# Patient Record
Sex: Male | Born: 1970
Health system: Southern US, Community
[De-identification: ages and names within clinical notes are randomized; demographics above are authoritative.]

## PROBLEM LIST (undated history)

## (undated) DIAGNOSIS — K219 Gastro-esophageal reflux disease without esophagitis: Secondary | ICD-10-CM

## (undated) DIAGNOSIS — I2510 Atherosclerotic heart disease of native coronary artery without angina pectoris: Secondary | ICD-10-CM

## (undated) DIAGNOSIS — I480 Paroxysmal atrial fibrillation: Secondary | ICD-10-CM

## (undated) HISTORY — DX: Gastro-esophageal reflux disease without esophagitis: K21.9

## (undated) HISTORY — PX: NISSEN FUNDOPLICATION: SHX2091

---

## 2003-10-06 ENCOUNTER — Emergency Department (HOSPITAL_COMMUNITY): Admission: EM | Admit: 2003-10-06 | Discharge: 2003-10-07 | Payer: Self-pay | Admitting: Emergency Medicine

## 2003-11-26 ENCOUNTER — Emergency Department (HOSPITAL_COMMUNITY): Admission: EM | Admit: 2003-11-26 | Discharge: 2003-11-26 | Payer: Self-pay | Admitting: Emergency Medicine

## 2015-11-27 ENCOUNTER — Encounter (HOSPITAL_COMMUNITY): Payer: Self-pay

## 2015-11-27 ENCOUNTER — Emergency Department (HOSPITAL_COMMUNITY)
Admission: EM | Admit: 2015-11-27 | Discharge: 2015-11-27 | Disposition: A | Discharge status: Home / Self Care | Payer: No Typology Code available for payment source | Attending: Emergency Medicine | Admitting: Emergency Medicine

## 2015-11-27 ENCOUNTER — Emergency Department (HOSPITAL_COMMUNITY): Payer: No Typology Code available for payment source

## 2015-11-27 DIAGNOSIS — M542 Cervicalgia: Secondary | ICD-10-CM

## 2015-11-27 DIAGNOSIS — S199XXA Unspecified injury of neck, initial encounter: Secondary | ICD-10-CM | POA: Diagnosis present

## 2015-11-27 DIAGNOSIS — Y92410 Unspecified street and highway as the place of occurrence of the external cause: Secondary | ICD-10-CM | POA: Diagnosis not present

## 2015-11-27 DIAGNOSIS — Y939 Activity, unspecified: Secondary | ICD-10-CM | POA: Insufficient documentation

## 2015-11-27 DIAGNOSIS — Y999 Unspecified external cause status: Secondary | ICD-10-CM | POA: Insufficient documentation

## 2015-11-27 MED ORDER — IBUPROFEN 400 MG PO TABS
800.0000 mg | ORAL_TABLET | Freq: Once | ORAL | Status: AC
  Administered 2015-11-27: 800 mg via ORAL
  Filled 2015-11-27: qty 2

## 2015-11-27 MED ORDER — IBUPROFEN 800 MG PO TABS: 800.0000 mg | ORAL_TABLET | Freq: Three times a day (TID) | ORAL | 0 refills | Status: DC

## 2015-11-27 MED ORDER — CYCLOBENZAPRINE HCL 10 MG PO TABS: 10.0000 mg | ORAL_TABLET | Freq: Two times a day (BID) | ORAL | 0 refills | Status: DC | PRN

## 2015-11-27 MED ORDER — ACETAMINOPHEN 325 MG PO TABS
650.0000 mg | ORAL_TABLET | Freq: Once | ORAL | Status: AC
  Administered 2015-11-27: 650 mg via ORAL
  Filled 2015-11-27: qty 2
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
## 2015-11-27 NOTE — ED Notes (Signed)
Patient returned from x ray and ambulated to restroom without difficulty
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
## 2015-11-27 NOTE — Discharge Instructions (Signed)
Take Flexeril twice daily as needed for muscle stiffness. Do not drive only take this medication. You may also take ibuprofen 3 times daily for pain. Please follow-up with your regular doctor in 1 week. Return to the emergency department if you experience uncontrolled pain, numbness, weakness, chest pain, shortness breath or abdominal pain, or any new symptoms.

## 2016-03-28 DIAGNOSIS — K219 Gastro-esophageal reflux disease without esophagitis: Secondary | ICD-10-CM | POA: Diagnosis not present

## 2016-05-02 DIAGNOSIS — K210 Gastro-esophageal reflux disease with esophagitis: Secondary | ICD-10-CM | POA: Diagnosis not present

## 2016-11-25 DIAGNOSIS — S61211A Laceration without foreign body of left index finger without damage to nail, initial encounter: Secondary | ICD-10-CM | POA: Diagnosis not present

## 2016-11-25 DIAGNOSIS — W260XXA Contact with knife, initial encounter: Secondary | ICD-10-CM | POA: Diagnosis not present

## 2016-11-25 DIAGNOSIS — Y93G3 Activity, cooking and baking: Secondary | ICD-10-CM | POA: Diagnosis not present

## 2016-11-30 DIAGNOSIS — Z5189 Encounter for other specified aftercare: Secondary | ICD-10-CM | POA: Diagnosis not present

## 2016-12-30 DIAGNOSIS — H524 Presbyopia: Secondary | ICD-10-CM | POA: Diagnosis not present

## 2017-03-04 DIAGNOSIS — L03032 Cellulitis of left toe: Secondary | ICD-10-CM | POA: Diagnosis not present

## 2017-09-17 DIAGNOSIS — T782XXA Anaphylactic shock, unspecified, initial encounter: Secondary | ICD-10-CM | POA: Diagnosis not present

## 2017-09-17 DIAGNOSIS — R21 Rash and other nonspecific skin eruption: Secondary | ICD-10-CM | POA: Diagnosis not present

## 2017-09-17 DIAGNOSIS — R069 Unspecified abnormalities of breathing: Secondary | ICD-10-CM | POA: Diagnosis not present

## 2017-09-17 DIAGNOSIS — S0006XA Insect bite (nonvenomous) of scalp, initial encounter: Secondary | ICD-10-CM | POA: Diagnosis not present

## 2017-09-17 DIAGNOSIS — R0602 Shortness of breath: Secondary | ICD-10-CM | POA: Diagnosis not present

## 2017-09-17 DIAGNOSIS — T63441A Toxic effect of venom of bees, accidental (unintentional), initial encounter: Secondary | ICD-10-CM | POA: Diagnosis not present

## 2017-09-17 DIAGNOSIS — R479 Unspecified speech disturbances: Secondary | ICD-10-CM | POA: Diagnosis not present

## 2017-09-17 DIAGNOSIS — G4489 Other headache syndrome: Secondary | ICD-10-CM | POA: Diagnosis not present

## 2017-09-17 DIAGNOSIS — T7840XA Allergy, unspecified, initial encounter: Secondary | ICD-10-CM | POA: Diagnosis not present

## 2017-09-17 DIAGNOSIS — L299 Pruritus, unspecified: Secondary | ICD-10-CM | POA: Diagnosis not present

## 2017-09-17 DIAGNOSIS — R220 Localized swelling, mass and lump, head: Secondary | ICD-10-CM | POA: Diagnosis not present

## 2018-01-07 ENCOUNTER — Ambulatory Visit: Payer: BLUE CROSS/BLUE SHIELD | Admitting: Family Medicine

## 2018-01-07 ENCOUNTER — Encounter: Payer: Self-pay | Admitting: Family Medicine

## 2018-01-07 VITALS — BP 92/60 | HR 53 | Temp 98.3°F | Wt 182.4 lb

## 2018-01-07 DIAGNOSIS — R6882 Decreased libido: Secondary | ICD-10-CM | POA: Diagnosis not present

## 2018-01-07 DIAGNOSIS — K219 Gastro-esophageal reflux disease without esophagitis: Secondary | ICD-10-CM | POA: Diagnosis not present

## 2018-01-07 DIAGNOSIS — J069 Acute upper respiratory infection, unspecified: Secondary | ICD-10-CM | POA: Diagnosis not present

## 2018-01-07 MED ORDER — PANTOPRAZOLE SODIUM 40 MG PO TBEC: 40.0000 mg | DELAYED_RELEASE_TABLET | Freq: Every day | ORAL | 2 refills | Status: DC

## 2018-01-07 NOTE — Assessment & Plan Note (Signed)
Reflux is not well controlled with omeprazole.  Rx changed to pantoprazole.  He will let me know how this is working for him over the next couple of weeks.

## 2018-01-07 NOTE — Patient Instructions (Signed)
Return for lab visit when feeling better.  Best time to check is between 8-930am, fasting.   Upper Respiratory Infection, Adult Most upper respiratory infections (URIs) are caused by a virus. A URI affects the nose, throat, and upper air passages. The most common type of URI is often called "the common cold." Follow these instructions at home:  Take medicines only as told by your doctor.  Gargle warm saltwater or take cough drops to comfort your throat as told by your doctor.  Use a warm mist humidifier or inhale steam from a shower to increase air moisture. This may make it easier to breathe.  Drink enough fluid to keep your pee (urine) clear or pale yellow.  Eat soups and other clear broths.  Have a healthy diet.  Rest as needed.  Go back to work when your fever is gone or your doctor says it is okay. ? You may need to stay home longer to avoid giving your URI to others. ? You can also wear a face mask and wash your hands often to prevent spread of the virus.  Use your inhaler more if you have asthma.  Do not use any tobacco products, including cigarettes, chewing tobacco, or electronic cigarettes. If you need help quitting, ask your doctor. Contact a doctor if:  You are getting worse, not better.  Your symptoms are not helped by medicine.  You have chills.  You are getting more short of breath.  You have brown or red mucus.  You have yellow or brown discharge from your nose.  You have pain in your face, especially when you bend forward.  You have a fever.  You have puffy (swollen) neck glands.  You have pain while swallowing.  You have white areas in the back of your throat. Get help right away if:  You have very bad or constant: ? Headache. ? Ear pain. ? Pain in your forehead, behind your eyes, and over your cheekbones (sinus pain). ? Chest pain.  You have long-lasting (chronic) lung disease and any of the following: ? Wheezing. ? Long-lasting  cough. ? Coughing up blood. ? A change in your usual mucus.  You have a stiff neck.  You have changes in your: ? Vision. ? Hearing. ? Thinking. ? Mood. This information is not intended to replace advice given to you by your health care provider. Make sure you discuss any questions you have with your health care provider. Document Released: 08/06/2007 Document Revised: 10/21/2015 Document Reviewed: 05/25/2013 Elsevier Interactive Patient Education  2018 [employer].

## 2018-01-07 NOTE — Progress Notes (Signed)
Travis Kaufman - 47 y.o. male MRN [id number]  Date of birth: 10-10-1970  Subjective Chief Complaint  Patient presents with  . Sinus Problem    HPI SUBJECTIVE:  Travis Kaufman is a 47 y.o. male with history of GERD who complains of congestion, sore throat, dry cough and fever for 2 days.  Only had fever and sweats on first day but none since that time.  He denies a history of chest pain, dizziness, myalgias, nausea, shortness of breath, vomiting and wheezing and does not have a history of asthma. Patient does not smoke cigarettes.  In regards to his GERD he is taking omeprazole but reports that symptoms are not adequately controlled with current medication  Also has concerns about decreased energy levels and decreased libido.  Denies issues with ED.  Would like to have testosterone levels checked.   ROS:  A comprehensive ROS was completed and negative except as noted per HPI    Allergies  Allergen Reactions  . Wasp Venom Anaphylaxis    Hives, Swelling, Shortness of Breath    Past Medical History:  Diagnosis Date  . GERD (gastroesophageal reflux disease)     History reviewed. No pertinent surgical history.  Social History   Socioeconomic History  . Marital status: Married    Spouse name: Not on file  . Number of children: Not on file  . Years of education: Not on file  . Highest education level: Not on file  Occupational History  . Not on file  Social Needs  . Financial resource strain: Not on file  . Food insecurity:    Worry: Not on file    Inability: Not on file  . Transportation needs:    Medical: Not on file    Non-medical: Not on file  Tobacco Use  . Smoking status: Never Smoker  . Smokeless tobacco: Former Network engineer and Sexual Activity  . Alcohol use: Yes    Comment: occasional   . Drug use: Never  . Sexual activity: Not on file  Lifestyle  . Physical activity:    Days per week: Not on file    Minutes per session: Not on file  . Stress:  Not on file  Relationships  . Social connections:    Talks on phone: Not on file    Gets together: Not on file    Attends religious service: Not on file    Active member of club or organization: Not on file    Attends meetings of clubs or organizations: Not on file    Relationship status: Not on file  Other Topics Concern  . Not on file  Social History Narrative  . Not on file    History reviewed. No pertinent family history.  Health Maintenance  Topic Date Due  . TETANUS/TDAP  05/19/1989  . INFLUENZA VACCINE  10/01/2017  . HIV Screening  01/08/2019 (Originally 05/19/1985)    ----------------------------------------------------------------------------------------------------------------------------------------------------------------------------------------------------------------- Physical Exam BP 92/60   Pulse (!) 53   Temp 98.3 F (36.8 C) (Oral)   Wt 182 lb 6.4 oz (82.7 kg)   SpO2 98%   BMI 25.44 kg/m   Physical Exam  Constitutional: He is oriented to person, place, and time. He appears well-nourished. No distress.  HENT:  Head: Normocephalic and atraumatic.  Mouth/Throat: Oropharynx is clear and moist.  TM normal No sinus tenderness   Eyes: No scleral icterus.  Neck: Neck supple. No thyromegaly present.  Cardiovascular: Normal rate, regular rhythm and normal heart sounds.  Pulmonary/Chest:  Effort normal and breath sounds normal.  Musculoskeletal: He exhibits no edema.  Lymphadenopathy:    He has no cervical adenopathy.  Neurological: He is alert and oriented to person, place, and time.  Skin: Skin is warm and dry.  Psychiatric: He has a normal mood and affect. His behavior is normal.    ------------------------------------------------------------------------------------------------------------------------------------------------------------------------------------------------------------------- Assessment and Plan  Decreased libido without sexual  dysfunction He'll return for early am labs when fasting to check testosterone levels.   Viral upper respiratory tract infection  Symptomatic therapy suggested: push fluids, rest, use vaporizer or mist prn and return office visit prn if symptoms persist or worsen. Lack of antibiotic effectiveness discussed with him. Call or return to clinic prn if these symptoms worsen or fail to improve as anticipated.   GERD (gastroesophageal reflux disease) Reflux is not well controlled with omeprazole.  Rx changed to pantoprazole.  He will let me know how this is working for him over the next couple of weeks.

## 2018-01-07 NOTE — Assessment & Plan Note (Signed)
  Symptomatic therapy suggested: push fluids, rest, use vaporizer or mist prn and return office visit prn if symptoms persist or worsen. Lack of antibiotic effectiveness discussed with him. Call or return to clinic prn if these symptoms worsen or fail to improve as anticipated.

## 2018-01-07 NOTE — Assessment & Plan Note (Signed)
He'll return for early am labs when fasting to check testosterone levels.

## 2018-01-14 ENCOUNTER — Ambulatory Visit: Payer: BLUE CROSS/BLUE SHIELD | Admitting: Family Medicine

## 2018-02-10 ENCOUNTER — Other Ambulatory Visit (INDEPENDENT_AMBULATORY_CARE_PROVIDER_SITE_OTHER): Payer: BLUE CROSS/BLUE SHIELD

## 2018-02-10 DIAGNOSIS — R6882 Decreased libido: Secondary | ICD-10-CM | POA: Diagnosis not present

## 2018-02-10 LAB — TESTOSTERONE: Testosterone: 590.44 ng/dL (ref 300.00–890.00)

## 2018-02-13 LAB — TESTOSTERONE, FREE: TESTOSTERONE FREE: 81.1 pg/mL (ref 46.0–224.0)

## 2018-02-13 LAB — SEX HORMONE BINDING GLOBULIN: SEX HORMONE BINDING: 51 nmol/L — AB (ref 10–50)

## 2018-02-14 LAB — TESTOSTERONE, % FREE: Testosterone-% Free: 1.3 %

## 2018-06-24 DIAGNOSIS — M545 Low back pain: Secondary | ICD-10-CM | POA: Diagnosis not present

## 2018-06-24 DIAGNOSIS — M62830 Muscle spasm of back: Secondary | ICD-10-CM | POA: Diagnosis not present

## 2018-06-24 DIAGNOSIS — M5417 Radiculopathy, lumbosacral region: Secondary | ICD-10-CM | POA: Diagnosis not present

## 2018-06-24 DIAGNOSIS — M9903 Segmental and somatic dysfunction of lumbar region: Secondary | ICD-10-CM | POA: Diagnosis not present

## 2018-06-25 DIAGNOSIS — M9903 Segmental and somatic dysfunction of lumbar region: Secondary | ICD-10-CM | POA: Diagnosis not present

## 2018-06-25 DIAGNOSIS — M5417 Radiculopathy, lumbosacral region: Secondary | ICD-10-CM | POA: Diagnosis not present

## 2018-06-25 DIAGNOSIS — M545 Low back pain: Secondary | ICD-10-CM | POA: Diagnosis not present

## 2018-06-25 DIAGNOSIS — M62830 Muscle spasm of back: Secondary | ICD-10-CM | POA: Diagnosis not present

## 2018-06-30 DIAGNOSIS — M5417 Radiculopathy, lumbosacral region: Secondary | ICD-10-CM | POA: Diagnosis not present

## 2018-06-30 DIAGNOSIS — M545 Low back pain: Secondary | ICD-10-CM | POA: Diagnosis not present

## 2018-06-30 DIAGNOSIS — M9903 Segmental and somatic dysfunction of lumbar region: Secondary | ICD-10-CM | POA: Diagnosis not present

## 2018-06-30 DIAGNOSIS — M62830 Muscle spasm of back: Secondary | ICD-10-CM | POA: Diagnosis not present

## 2018-07-02 DIAGNOSIS — M545 Low back pain: Secondary | ICD-10-CM | POA: Diagnosis not present

## 2018-07-02 DIAGNOSIS — M9903 Segmental and somatic dysfunction of lumbar region: Secondary | ICD-10-CM | POA: Diagnosis not present

## 2018-07-02 DIAGNOSIS — M62830 Muscle spasm of back: Secondary | ICD-10-CM | POA: Diagnosis not present

## 2018-07-02 DIAGNOSIS — M5417 Radiculopathy, lumbosacral region: Secondary | ICD-10-CM | POA: Diagnosis not present

## 2018-07-12 ENCOUNTER — Telehealth: Payer: Self-pay | Admitting: Family Medicine

## 2018-07-12 NOTE — Telephone Encounter (Signed)
Called pt because of message he sent in to request appt for cholesterol/small mole on neck, left message

## 2018-07-13 ENCOUNTER — Telehealth (INDEPENDENT_AMBULATORY_CARE_PROVIDER_SITE_OTHER): Payer: BLUE CROSS/BLUE SHIELD | Admitting: Family Medicine

## 2018-07-13 ENCOUNTER — Encounter: Payer: Self-pay | Admitting: Family Medicine

## 2018-07-13 DIAGNOSIS — K219 Gastro-esophageal reflux disease without esophagitis: Secondary | ICD-10-CM

## 2018-07-13 DIAGNOSIS — D229 Melanocytic nevi, unspecified: Secondary | ICD-10-CM | POA: Insufficient documentation

## 2018-07-13 DIAGNOSIS — E785 Hyperlipidemia, unspecified: Secondary | ICD-10-CM | POA: Insufficient documentation

## 2018-07-13 MED ORDER — DEXLANSOPRAZOLE 60 MG PO CPDR
60.0000 mg | DELAYED_RELEASE_CAPSULE | Freq: Every day | ORAL | 6 refills | Status: DC
Start: 2018-07-13 — End: 2019-04-25

## 2018-07-13 NOTE — Assessment & Plan Note (Signed)
-  Elevated LDL on recent labs, he will send me a full copy to review.

## 2018-07-13 NOTE — Assessment & Plan Note (Signed)
-  Has not been well controlled with omeprazole or pantoprazole.  Will give trial of dexilant.  If not improving with this, we discussed referral back to GI.

## 2018-07-13 NOTE — Progress Notes (Signed)
Travis Kaufman - 48 y.o. male MRN [id number]  Date of birth: 01/08/1971   This visit type was conducted due to national recommendations for restrictions regarding the COVID-19 Pandemic (e.g. social distancing).  This format is felt to be most appropriate for this patient at this time.  All issues noted in this document were discussed and addressed.  No physical exam was performed (except for noted visual exam findings with Video Visits).  I discussed the limitations of evaluation and management by telemedicine and the availability of in person appointments. The patient expressed understanding and agreed to proceed.  I connected with@ on 07/13/18 at  3:00 PM EDT by a video enabled telemedicine application and verified that I am speaking with the correct person using two identifiers.   Patient Location: Home [address] [place] [address]   Provider location:   Home office  Chief Complaint  Patient presents with  . Follow-up    Lipids/small mole on neck-L,no color change, "just dark"," increased in sized over last 6 mo"    HPI  Travis Kaufman is a 48 y.o. male who presents via audio/video conferencing for a telehealth visit today.  He would like to discuss ongoing issues with reflux, recent cholesterol results and mole.   -GERD:  He continues to have poorly controlled GERD.  He has tried several medications in the past including omeprazole and most recently protonix.  Reports that several family members had issues with this in the past including his father and sister.  He has been seen by GI with normal EGD previously.   -HLD:  Had recent lipid panel completed through work with LDL in the 170's. He reports that he eats a healthy diet and is quite active.  No cholesterol issues run in the family that he is aware of.   -Mole:  Pigmented mole on L side of neck, seems to be getting larger.  Non-painful.     ROS:  A comprehensive ROS was completed and negative  except as noted per HPI  Past Medical History:  Diagnosis Date  . GERD (gastroesophageal reflux disease)     No past surgical history on file.  No family history on file.  Social History   Socioeconomic History  . Marital status: Married    Spouse name: Not on file  . Number of children: Not on file  . Years of education: Not on file  . Highest education level: Not on file  Occupational History  . Not on file  Social Needs  . Financial resource strain: Not on file  . Food insecurity:    Worry: Not on file    Inability: Not on file  . Transportation needs:    Medical: Not on file    Non-medical: Not on file  Tobacco Use  . Smoking status: Never Smoker  . Smokeless tobacco: Former Network engineer and Sexual Activity  . Alcohol use: Yes    Comment: occasional   . Drug use: Never  . Sexual activity: Not on file  Lifestyle  . Physical activity:    Days per week: Not on file    Minutes per session: Not on file  . Stress: Not on file  Relationships  . Social connections:    Talks on phone: Not on file    Gets together: Not on file    Attends religious service: Not on file    Active member of club or organization: Not on file  Attends meetings of clubs or organizations: Not on file    Relationship status: Not on file  . Intimate partner violence:    Fear of current or ex partner: Not on file    Emotionally abused: Not on file    Physically abused: Not on file    Forced sexual activity: Not on file  Other Topics Concern  . Not on file  Social History Narrative  . Not on file     Current Outpatient Medications:  .  EPINEPHrine 0.3 mg/0.3 mL IJ SOAJ injection, Inject into the muscle., Disp: , Rfl:  .  pantoprazole (PROTONIX) 40 MG tablet, Take 1 tablet (40 mg total) by mouth daily., Disp: 90 tablet, Rfl: 2  EXAM:  VITALS per patient if applicable: BP 098/11   Pulse (!) 54 Comment: smartwatch  Ht 5\' 11"  (1.803 m)   Wt 182 lb 9.6 oz (82.8 kg)   BMI 25.47  kg/m   GENERAL: alert, oriented, appears well and in no acute distress  HEENT: atraumatic, conjunttiva clear, no obvious abnormalities on inspection of external nose and ears  NECK: normal movements of the head and neck  LUNGS: on inspection no signs of respiratory distress, breathing rate appears normal, no obvious gross SOB, gasping or wheezing  CV: no obvious cyanosis  Skin: Pigmented lesion to L side of neck.  Difficult to see over video.    MS: moves all visible extremities without noticeable abnormality  PSYCH/NEURO: pleasant and cooperative, no obvious depression or anxiety, speech and thought processing grossly intact  ASSESSMENT AND PLAN:  Discussed the following assessment and plan:  GERD (gastroesophageal reflux disease) -Has not been well controlled with omeprazole or pantoprazole.  Will give trial of dexilant.  If not improving with this, we discussed referral back to GI.    HLD (hyperlipidemia) -Elevated LDL on recent labs, he will send me a full copy to review.     [facility] Will have him come in to evaluate in office.        I discussed the assessment and treatment plan with the patient. The patient was provided an opportunity to ask questions and all were answered. The patient agreed with the plan and demonstrated an understanding of the instructions.   The patient was advised to call back or seek an in-person evaluation if the symptoms worsen or if the condition fails to improve as anticipated.     Luetta Nutting, DO

## 2018-07-13 NOTE — Assessment & Plan Note (Signed)
Will have him come in to evaluate in office.

## 2018-07-14 ENCOUNTER — Encounter: Payer: Self-pay | Admitting: Family Medicine

## 2018-08-15 ENCOUNTER — Observation Stay (HOSPITAL_BASED_OUTPATIENT_CLINIC_OR_DEPARTMENT_OTHER): Payer: BC Managed Care – PPO

## 2018-08-15 ENCOUNTER — Encounter (HOSPITAL_BASED_OUTPATIENT_CLINIC_OR_DEPARTMENT_OTHER): Payer: Self-pay | Admitting: Emergency Medicine

## 2018-08-15 ENCOUNTER — Emergency Department (HOSPITAL_BASED_OUTPATIENT_CLINIC_OR_DEPARTMENT_OTHER): Payer: BC Managed Care – PPO

## 2018-08-15 ENCOUNTER — Observation Stay (HOSPITAL_BASED_OUTPATIENT_CLINIC_OR_DEPARTMENT_OTHER)
Admission: EM | Admit: 2018-08-15 | Discharge: 2018-08-17 | Disposition: A | Discharge status: Home / Self Care | Payer: BC Managed Care – PPO | Attending: Cardiology | Admitting: Cardiology

## 2018-08-15 ENCOUNTER — Other Ambulatory Visit: Payer: Self-pay

## 2018-08-15 DIAGNOSIS — E785 Hyperlipidemia, unspecified: Secondary | ICD-10-CM | POA: Diagnosis present

## 2018-08-15 DIAGNOSIS — N179 Acute kidney failure, unspecified: Secondary | ICD-10-CM | POA: Diagnosis not present

## 2018-08-15 DIAGNOSIS — Z20828 Contact with and (suspected) exposure to other viral communicable diseases: Secondary | ICD-10-CM | POA: Diagnosis not present

## 2018-08-15 DIAGNOSIS — R12 Heartburn: Secondary | ICD-10-CM | POA: Diagnosis not present

## 2018-08-15 DIAGNOSIS — M79602 Pain in left arm: Secondary | ICD-10-CM | POA: Diagnosis not present

## 2018-08-15 DIAGNOSIS — R0789 Other chest pain: Principal | ICD-10-CM | POA: Insufficient documentation

## 2018-08-15 DIAGNOSIS — R079 Chest pain, unspecified: Secondary | ICD-10-CM | POA: Diagnosis present

## 2018-08-15 DIAGNOSIS — Z1159 Encounter for screening for other viral diseases: Secondary | ICD-10-CM | POA: Insufficient documentation

## 2018-08-15 DIAGNOSIS — R0600 Dyspnea, unspecified: Secondary | ICD-10-CM | POA: Diagnosis not present

## 2018-08-15 DIAGNOSIS — R001 Bradycardia, unspecified: Secondary | ICD-10-CM

## 2018-08-15 DIAGNOSIS — I200 Unstable angina: Secondary | ICD-10-CM | POA: Diagnosis not present

## 2018-08-15 DIAGNOSIS — K228 Other specified diseases of esophagus: Secondary | ICD-10-CM | POA: Insufficient documentation

## 2018-08-15 DIAGNOSIS — K3189 Other diseases of stomach and duodenum: Secondary | ICD-10-CM | POA: Insufficient documentation

## 2018-08-15 DIAGNOSIS — I208 Other forms of angina pectoris: Secondary | ICD-10-CM | POA: Diagnosis not present

## 2018-08-15 DIAGNOSIS — K219 Gastro-esophageal reflux disease without esophagitis: Secondary | ICD-10-CM | POA: Diagnosis not present

## 2018-08-15 DIAGNOSIS — Z79899 Other long term (current) drug therapy: Secondary | ICD-10-CM | POA: Insufficient documentation

## 2018-08-15 LAB — BASIC METABOLIC PANEL
Anion gap: 12 (ref 5–15)
BUN: 14 mg/dL (ref 6–20)
CO2: 23 mmol/L (ref 22–32)
Calcium: 10.1 mg/dL (ref 8.9–10.3)
Chloride: 103 mmol/L (ref 98–111)
Creatinine, Ser: 1.13 mg/dL (ref 0.61–1.24)
GFR calc Af Amer: >60 mL/min (ref >60)
GFR calc non Af Amer: >60 mL/min (ref >60)
Glucose, Bld: 116 mg/dL — ABNORMAL HIGH (ref 70–99)
Potassium: 3.6 mmol/L (ref 3.5–5.1)
Sodium: 138 mmol/L (ref 135–145)

## 2018-08-15 LAB — CBC WITH DIFFERENTIAL/PLATELET
Abs Immature Granulocytes: 0.05 10*3/uL (ref 0.00–0.07)
Basophils Absolute: 0.1 10*3/uL (ref 0.0–0.1)
Basophils Relative: 1 %
Eosinophils Absolute: 0.4 10*3/uL (ref 0.0–0.5)
Eosinophils Relative: 4 %
HCT: 47 % (ref 39.0–52.0)
Hemoglobin: 16.1 g/dL (ref 13.0–17.0)
Immature Granulocytes: 1 %
Lymphocytes Relative: 32 %
Lymphs Abs: 2.8 10*3/uL (ref 0.7–4.0)
MCH: 31.3 pg (ref 26.0–34.0)
MCHC: 34.3 g/dL (ref 30.0–36.0)
MCV: 91.3 fL (ref 80.0–100.0)
Monocytes Absolute: 0.9 10*3/uL (ref 0.1–1.0)
Monocytes Relative: 10 %
Neutro Abs: 4.5 10*3/uL (ref 1.7–7.7)
Neutrophils Relative %: 52 %
Platelets: 335 10*3/uL (ref 150–400)
RBC: 5.15 MIL/uL (ref 4.22–5.81)
RDW: 13.4 % (ref 11.5–15.5)
WBC: 8.7 10*3/uL (ref 4.0–10.5)
nRBC: 0 % (ref 0.0–0.2)

## 2018-08-15 LAB — TROPONIN I
Troponin I: <0.03 ng/mL (ref <0.03)
Troponin I: <0.03 ng/mL (ref <0.03)
Troponin I: <0.03 ng/mL (ref <0.03)

## 2018-08-15 LAB — SARS CORONAVIRUS 2 AG (30 MIN TAT): SARS Coronavirus 2 Ag: NEGATIVE

## 2018-08-15 LAB — MRSA PCR SCREENING: MRSA by PCR: NEGATIVE

## 2018-08-15 MED ORDER — ONDANSETRON HCL 4 MG/2ML IJ SOLN
4.0000 mg | Freq: Once | INTRAMUSCULAR | Status: AC
  Administered 2018-08-15: 4 mg via INTRAVENOUS

## 2018-08-15 MED ORDER — NITROGLYCERIN 0.4 MG SL SUBL
0.4000 mg | SUBLINGUAL_TABLET | SUBLINGUAL | Status: DC | PRN
  Administered 2018-08-15: 06:00:00 0.4 mg via SUBLINGUAL
  Filled 2018-08-15: qty 1

## 2018-08-15 MED ORDER — ASPIRIN EC 81 MG PO TBEC
81.0000 mg | DELAYED_RELEASE_TABLET | Freq: Every day | ORAL | Status: DC
  Administered 2018-08-16: 81 mg via ORAL
  Filled 2018-08-15: qty 1

## 2018-08-15 MED ORDER — FENTANYL CITRATE (PF) 100 MCG/2ML IJ SOLN
100.0000 ug | INTRAMUSCULAR | Status: DC | PRN
  Administered 2018-08-15 (×2): 100 ug via INTRAVENOUS
  Filled 2018-08-15 (×2): qty 2

## 2018-08-15 MED ORDER — ONDANSETRON HCL 4 MG/2ML IJ SOLN: 4.0000 mg | Freq: Four times a day (QID) | INTRAMUSCULAR | Status: DC | PRN

## 2018-08-15 MED ORDER — ALUM & MAG HYDROXIDE-SIMETH 200-200-20 MG/5ML PO SUSP
30.0000 mL | Freq: Once | ORAL | Status: AC
  Administered 2018-08-15: 06:00:00 30 mL via ORAL
  Filled 2018-08-15: qty 30

## 2018-08-15 MED ORDER — MORPHINE SULFATE (PF) 2 MG/ML IV SOLN
2.0000 mg | Freq: Once | INTRAVENOUS | Status: AC
  Administered 2018-08-15: 12:00:00 2 mg via INTRAVENOUS
  Filled 2018-08-15: qty 1

## 2018-08-15 MED ORDER — ENOXAPARIN SODIUM 40 MG/0.4ML ~~LOC~~ SOLN
40.0000 mg | SUBCUTANEOUS | Status: DC
  Administered 2018-08-15 – 2018-08-16 (×2): 40 mg via SUBCUTANEOUS
  Filled 2018-08-15 (×2): qty 0.4

## 2018-08-15 MED ORDER — FENTANYL CITRATE (PF) 100 MCG/2ML IJ SOLN
INTRAMUSCULAR | Status: AC
  Filled 2018-08-15: qty 2

## 2018-08-15 MED ORDER — NITROGLYCERIN 0.4 MG SL SUBL
0.4000 mg | SUBLINGUAL_TABLET | SUBLINGUAL | Status: DC | PRN
  Administered 2018-08-16: 0.4 mg via SUBLINGUAL

## 2018-08-15 MED ORDER — ACETAMINOPHEN 325 MG PO TABS: 650.0000 mg | ORAL_TABLET | ORAL | Status: DC | PRN

## 2018-08-15 MED ORDER — ACETAMINOPHEN 500 MG PO TABS
1000.0000 mg | ORAL_TABLET | Freq: Once | ORAL | Status: AC
  Administered 2018-08-15: 1000 mg via ORAL
  Filled 2018-08-15: qty 2

## 2018-08-15 MED ORDER — FENTANYL CITRATE (PF) 100 MCG/2ML IJ SOLN
25.0000 ug | INTRAMUSCULAR | Status: DC | PRN
  Administered 2018-08-15 – 2018-08-17 (×4): 25 ug via INTRAVENOUS
  Filled 2018-08-15 (×4): qty 2

## 2018-08-15 MED ORDER — SODIUM CHLORIDE 0.9 % IV BOLUS
500.0000 mL | Freq: Once | INTRAVENOUS | Status: AC
  Administered 2018-08-15: 06:00:00 500 mL via INTRAVENOUS

## 2018-08-15 MED ORDER — ASPIRIN 81 MG PO CHEW
324.0000 mg | CHEWABLE_TABLET | Freq: Once | ORAL | Status: AC
  Administered 2018-08-15: 324 mg via ORAL
  Filled 2018-08-15: qty 4

## 2018-08-15 MED ORDER — FENTANYL CITRATE (PF) 100 MCG/2ML IJ SOLN
50.0000 ug | Freq: Once | INTRAMUSCULAR | Status: AC
  Administered 2018-08-15: 06:00:00 50 ug via INTRAVENOUS

## 2018-08-15 MED ORDER — ONDANSETRON HCL 4 MG/2ML IJ SOLN
INTRAMUSCULAR | Status: AC
  Filled 2018-08-15: qty 2

## 2018-08-15 MED ORDER — PANTOPRAZOLE SODIUM 40 MG PO TBEC
40.0000 mg | DELAYED_RELEASE_TABLET | Freq: Every day | ORAL | Status: DC
  Administered 2018-08-15 – 2018-08-16 (×2): 40 mg via ORAL
  Filled 2018-08-15 (×3): qty 1

## 2018-08-15 NOTE — Progress Notes (Signed)
Consult placed for 18g piv due to pt being scheduled to have cardiac CT in AM. Pt requests for piv to be placed in the AM prior to procedure. Pt also prefers to have a piv, and does not want a midline for procedure. Lelon Frohlich, RN notified that IV Team will place piv in AM per pt request. Randol Kern, RN VAST

## 2018-08-15 NOTE — Progress Notes (Signed)
Wife updated via telephone.  Questions answered w/stated verbal understanding.  Await plan of care from cardiology.

## 2018-08-15 NOTE — Progress Notes (Signed)
Dr. Aundra Dubin paged to notify pt's arrival  [place], [place]

## 2018-08-15 NOTE — H&P (Addendum)
History & Physical    Patient ID: Travis Kaufman MRN: [id number], DOB/AGE: 06-12-1970   Admit date: 08/15/2018  Primary Care Provider: Luetta Nutting, DO Primary Cardiologist: New to [facility]  Chief Complaint:  Chest Pain  Patient Profile    Travis Kaufman is a 48 y.o. male with past medical history of GERD and no prior cardiac history who presented to [place] on 08/15/2018 for evaluation of chest pain.   History of Present Illness    Marquell Saenz Storr reports being active at baseline in working for a [employer]. He denies any recent chest pain or dyspnea on exertion when performing these activities.   Yesterday morning, he reports being stung by a bee on his leg. He had a previous allergic reaction and carries an Epi pen with him but yesterday he did not develop any hives, therefore he did not utilize this. Was in his normal state of health throughout the day until yesterday evening when he developed a shooting pain down his left arm. He reports having similar symptoms with his GERD in the past but this felt more severe. Denies any associated chest pain or dyspnea at that time. His pain improved but around 0400 the pain awoke him from sleep. Reports associated dyspnea at that time which prompted him to come to the ED. Denies any recent orthopnea, PND, or edema.   Still having some left arm pain at this time. He denies any personal history of known CAD. No known HTN, HLD, Type 2 DM or family history of CAD.   Initial labs show WBC 8.7, Hgb 16.1, platelets 335, Na+ 138, K+ 3.6, and creatinine 1.13. COVID testing negative. Initial troponin negative with cyclic values pending. CXR shows no active cardiopulmonary disease. EKG shows NSR, HR 94, with slight ST depression along anterolateral leads. Repeat tracing showing sinus bradycardia, HR 38, with no acute ST changes.    Past Medical History:  Diagnosis Date  . GERD (gastroesophageal  reflux disease)     History reviewed. No pertinent surgical history.   Medications Prior to Admission: Prior to Admission medications   Medication Sig Start Date End Date Taking? Authorizing Provider  dexlansoprazole (DEXILANT) 60 MG capsule Take 1 capsule (60 mg total) by mouth daily. 07/13/18   Luetta Nutting, DO  EPINEPHrine 0.3 mg/0.3 mL IJ SOAJ injection Inject into the muscle. 09/17/17   [provider]  pantoprazole (PROTONIX) 40 MG tablet Take 1 tablet (40 mg total) by mouth daily. 01/07/18   Luetta Nutting, DO     Allergies:    Allergies  Allergen Reactions  . Wasp Venom Anaphylaxis    Hives, Swelling, Shortness of Breath    Social History:   Social History   Socioeconomic History  . Marital status: Married    Spouse name: Not on file  . Number of children: Not on file  . Years of education: Not on file  . Highest education level: Not on file  Occupational History  . Not on file  Social Needs  . Financial resource strain: Not on file  . Food insecurity    Worry: Not on file    Inability: Not on file  . Transportation needs    Medical: Not on file    Non-medical: Not on file  Tobacco Use  . Smoking status: Never Smoker  . Smokeless tobacco: Former Network engineer and Sexual Activity  . Alcohol use: Yes    Comment: occasional   .  Drug use: Never  . Sexual activity: Not on file  Lifestyle  . Physical activity    Days per week: Not on file    Minutes per session: Not on file  . Stress: Not on file  Relationships  . Social Herbalist on phone: Not on file    Gets together: Not on file    Attends religious service: Not on file    Active member of club or organization: Not on file    Attends meetings of clubs or organizations: Not on file    Relationship status: Not on file  . Intimate partner violence    Fear of current or ex partner: Not on file    Emotionally abused: Not on file    Physically abused: Not on file    Forced sexual  activity: Not on file  Other Topics Concern  . Not on file  Social History Narrative  . Not on file      Family History:  The patient's family history includes GER disease in his father.    No family history of CAD or CHF.   Review of Systems    General:  No chills, fever, night sweats or weight changes. Positive for left arm pain.  Cardiovascular:  No chest pain, dyspnea on exertion, edema, orthopnea, palpitations, paroxysmal nocturnal dyspnea. Dermatological: No rash, lesions/masses Respiratory: No cough, dyspnea Urologic: No hematuria, dysuria Abdominal:   No nausea, vomiting, diarrhea, bright red blood per rectum, melena, or hematemesis Neurologic:  No visual changes, wkns, changes in mental status. All other systems reviewed and are otherwise negative except as noted above.  Physical Exam    Vitals:   08/15/18 0630 08/15/18 0730 08/15/18 0903 08/15/18 1153  BP: 113/77 (!) 103/54 111/74 115/82  Pulse: 62 (!) 51 (!) 51 (!) 50  Resp: 16 12 10  (!) 8  Temp:   99 F (37.2 C) 98.1 F (36.7 C)  TempSrc:   Oral Oral  SpO2: 100% 98% 95% 96%  Weight:      Height:        Intake/Output Summary (Last 24 hours) at 08/15/2018 1200 Last data filed at 08/15/2018 0718 Gross per 24 hour  Intake 500 ml  Output -  Net 500 ml   Filed Weights   08/15/18 0551  Weight: 86.2 kg   Body mass index is 26.5 kg/m.   General: Well developed, well nourished Caucasian male appearing in no acute distress. Head: Normocephalic, atraumatic, sclera non-icteric, no xanthomas, nares are without discharge. Neck: No carotid bruits. JVD not elevated.  Lungs: Respirations regular and unlabored, without wheezes or rales.  Heart: Regular rate and rhythm. No S3 or S4.  No murmur, no rubs, or gallops appreciated. Abdomen: Soft, non-tender, non-distended with normoactive bowel sounds. No hepatomegaly. No rebound/guarding. No obvious abdominal masses. Msk:  Strength and tone appear normal for age. No  joint deformities or effusions. Extremities: No clubbing or cyanosis. No edema.  Distal pedal pulses are 2+ bilaterally. Neuro: Alert and oriented X 3. Moves all extremities spontaneously. No focal deficits noted. Psych:  Responds to questions appropriately with a normal affect. Skin: No rashes or lesions noted  Labs and Radiology Studies    EKG:  The ECG that was done was personally reviewed and demonstrates NSR, HR 94, with slight ST depression along anterolateral leads. Repeat tracing showing sinus bradycardia, HR 38, with no acute ST changes.   Relevant CV Studies:  Echocardiogram: Pending  Laboratory Data:  Chemistry Recent Labs  Lab 08/15/18 0559  NA 138  K 3.6  CL 103  CO2 23  GLUCOSE 116*  BUN 14  CREATININE 1.13  CALCIUM 10.1  GFRNONAA >60  GFRAA >60  ANIONGAP 12    No results for input(s): PROT, ALBUMIN, AST, ALT, ALKPHOS, BILITOT in the last 168 hours. Hematology Recent Labs  Lab 08/15/18 0559  WBC 8.7  RBC 5.15  HGB 16.1  HCT 47.0  MCV 91.3  MCH 31.3  MCHC 34.3  RDW 13.4  PLT 335   Cardiac Enzymes Recent Labs  Lab 08/15/18 0559  TROPONINI <0.03   No results for input(s): TROPIPOC in the last 168 hours.  BNPNo results for input(s): BNP, PROBNP in the last 168 hours.  DDimer No results for input(s): DDIMER in the last 168 hours.  Radiology/Studies:  Dg Chest Portable 1 View  Result Date: 08/15/2018 CLINICAL DATA:  Acute onset chest and left arm pain approximately 1 hour ago. EXAM: PORTABLE CHEST 1 VIEW COMPARISON:  None. FINDINGS: The heart size and mediastinal contours are within normal limits. Both lungs are clear. The visualized skeletal structures are unremarkable. IMPRESSION: No active disease. Electronically Signed   By: Earle Gell M.D.   On: 08/15/2018 06:30    Assessment and Plan:   1. Left Arm Pain with Moderate Risk of Cardiac Etiology - developed left arm pain yesterday evening which can occur at rest or with activity. Was awoken  from sleep with recurrent left arm pain and dyspnea. Denies any known cardiac history. No known HTN, HLD, Type 2 DM or family history of CAD.  - Initial troponin negative with cyclic values pending. EKG shows NSR, HR 94, with slight ST depression along anterolateral leads. Repeat tracing showing sinus bradycardia, HR 38, with no acute ST changes.  - will continue to cycle cardiac enzymes and obtain an echocardiogram today. Check FLP. He has been started on ASA 81mg  daily. No BB given baseline bradycardia. Will review with Dr. Rayann Heman in regards to further testing tomorrow including NST vs. Coronary CT.   2. Bradycardia - HR has been in the 40's to 50's since admission. Says this is his typical baseline and HR increases into the 70's to 80's with activity.  - will avoid the use of AV nodal blocking agents. Continue to follow on telemetry.   3. GERD - continue PTA PPI therapy.   For questions or updates, please contact [place] Please consult [email or web address] for contact info under Cardiology/STEMI.   Signed, Erma Heritage, PA-C 08/15/2018, 12:00 PM Pager: [phone]   I have seen, examined the patient, and reviewed the above assessment and plan.  Changes to above are made where necessary.  On exam, RRR.  Symptoms have both typical and atypical features.  I would advise cardiac CT if CMs remain negative to definitively evaluate for ischemia.  Check lasting lipids and observe overnight.  Co Sign: Thompson Grayer, MD 08/15/2018 1:09 PM

## 2018-08-15 NOTE — ED Notes (Addendum)
ED TO INPATIENT HANDOFF REPORT  ED Nurse Name and Phone #: Normajean Baxter [phone]  S Name/Age/Gender Travis Kaufman 48 y.o. male Room/Bed: MH09/MH09  Code Status   Code Status: Not on file  Home/SNF/Other Home Patient oriented to: self, place, time and situation Is this baseline? Yes   Triage Complete: Triage complete  Chief Complaint chest pain  Triage Note Pt reports L sided chest pain radiating to L arm, reports some shortness of breath. Reports sharp/tight pain.   Allergies Allergies  Allergen Reactions  . Wasp Venom Anaphylaxis    Hives, Swelling, Shortness of Breath    Level of Care/Admitting Diagnosis ED Disposition    ED Disposition Condition Comment   Admit  Hospital Area: [facility] [[id number]]  Level of Care: Progressive [102]  Covid Evaluation: Confirmed COVID Negative  Diagnosis: Angina at rest [facility]) [[id number]]  Admitting Physician: Carlyle Dolly  Attending Physician: Larey Dresser [[id number]]  PT Class (Do Not Modify): Observation [104]  PT Acc Code (Do Not Modify): Observation [10022]       B Medical/Surgery History Past Medical History:  Diagnosis Date  . GERD (gastroesophageal reflux disease)    History reviewed. No pertinent surgical history.   A IV Location/Drains/Wounds Patient Lines/Drains/Airways Status   Active Line/Drains/Airways    Name:   Placement date:   Placement time:   Site:   Days:   Peripheral IV 08/15/18 Left Antecubital   08/15/18    0600    Antecubital   less than 1   Peripheral IV 08/15/18 Left Hand   08/15/18    0627    Hand   less than 1          Intake/Output Last 24 hours  Intake/Output Summary (Last 24 hours) at 08/15/2018 0724 Last data filed at 08/15/2018 4967 Gross per 24 hour  Intake 500 ml  Output -  Net 500 ml    Labs/Imaging Results for orders placed or performed during the hospital encounter of 08/15/18 (from the past 48 hour(s))  CBC with Differential     Status: None    Collection Time: 08/15/18  5:59 AM  Result Value Ref Range   WBC 8.7 4.0 - 10.5 K/uL   RBC 5.15 4.22 - 5.81 MIL/uL   Hemoglobin 16.1 13.0 - 17.0 g/dL   HCT 47.0 39.0 - 52.0 %   MCV 91.3 80.0 - 100.0 fL   MCH 31.3 26.0 - 34.0 pg   MCHC 34.3 30.0 - 36.0 g/dL   RDW 13.4 11.5 - 15.5 %   Platelets 335 150 - 400 K/uL   nRBC 0.0 0.0 - 0.2 %   Neutrophils Relative % 52 %   Neutro Abs 4.5 1.7 - 7.7 K/uL   Lymphocytes Relative 32 %   Lymphs Abs 2.8 0.7 - 4.0 K/uL   Monocytes Relative 10 %   Monocytes Absolute 0.9 0.1 - 1.0 K/uL   Eosinophils Relative 4 %   Eosinophils Absolute 0.4 0.0 - 0.5 K/uL   Basophils Relative 1 %   Basophils Absolute 0.1 0.0 - 0.1 K/uL   Immature Granulocytes 1 %   Abs Immature Granulocytes 0.05 0.00 - 0.07 K/uL    Comment: Performed at [facility], [address] [place], [place] [address]  Basic metabolic panel     Status: Abnormal   Collection Time: 08/15/18  5:59 AM  Result Value Ref Range   Sodium 138 135 - 145 mmol/L   Potassium 3.6 3.5 -  5.1 mmol/L   Chloride 103 98 - 111 mmol/L   CO2 23 22 - 32 mmol/L   Glucose, Bld 116 (H) 70 - 99 mg/dL   BUN 14 6 - 20 mg/dL   Creatinine, Ser 1.13 0.61 - 1.24 mg/dL   Calcium 10.1 8.9 - 10.3 mg/dL   GFR calc non Af Amer >60 >60 mL/min   GFR calc Af Amer >60 >60 mL/min   Anion gap 12 5 - 15    Comment: Performed at [facility], [address] [place], [place] [address]  Troponin I - ONCE - STAT     Status: None   Collection Time: 08/15/18  5:59 AM  Result Value Ref Range   Troponin I <0.03 <0.03 ng/mL    Comment: Performed at [facility], [address] [place], [place] [address]  SARS Coronavirus 2 (Hosp order,Performed in [place] lab via [employer] ID)     Status: None   Collection Time: 08/15/18  6:12 AM   Specimen: Dry Nasal Swab ([employer] ID Now)  Result Value Ref Range   SARS Coronavirus 2 ([facility] [facility] Now) NEGATIVE NEGATIVE    Comment: (NOTE) Interpretive Result  Comment(s): COVID 19 Positive SARS CoV 2 target nucleic acids are DETECTED. The SARS CoV 2 RNA is generally detectable in upper and lower respiratory specimens during the acute phase of infection.  Positive results are indicative of active infection with SARS CoV 2.  Clinical correlation with patient history and other diagnostic information is necessary to determine patient infection status.  Positive results do not rule out bacterial infection or coinfection with other viruses. The expected result is Negative. COVID 19 Negative SARS CoV 2 target nucleic acids are NOT DETECTED. The SARS CoV 2 RNA is generally detectable in upper and lower respiratory specimens during the acute phase of infection.  Negative results do not preclude SARS CoV 2 infection, do not rule out coinfections with other pathogens, and should not be used as the sole basis for treatment or other patient management decisions.  Negative results must be combined with clinical  observations, patient history, and epidemiological information. The expected result is Negative. Invalid Presence or absence of SARS CoV 2 nucleic acids cannot be determined. Repeat testing was performed on the submitted specimen and repeated Invalid results were obtained.  If clinically indicated, additional testing on a new specimen with an alternate test methodology [phone]) is advised.  The SARS CoV 2 RNA is generally detectable in upper and lower respiratory specimens during the acute phase of infection. The expected result is Negative. Fact Sheet for Patients:  GolfingFamily.no Fact Sheet for Healthcare Providers: https://www.hernandez-brewer.com/ This test is not yet approved or cleared by the [place] FDA and has been authorized for detection and/or diagnosis of SARS CoV 2 by FDA under an Emergency Use Authorization (EUA).  This EUA will remain in effect (meaning this test can be used) for the  duration of the COVID19 d eclaration under Section 564(b)(1) of the Act, 21 U.S.C. section [phone] 3(b)(1), unless the authorization is terminated or revoked sooner. Performed at [facility], [address] [place], [place] [address]    Dg Chest Portable 1 View  Result Date: 08/15/2018 CLINICAL DATA:  Acute onset chest and left arm pain approximately 1 hour ago. EXAM: PORTABLE CHEST 1 VIEW COMPARISON:  None. FINDINGS: The heart size and mediastinal contours are within normal limits. Both lungs are clear. The visualized skeletal structures are unremarkable.  IMPRESSION: No active disease. Electronically Signed   By: Earle Gell M.D.   On: 08/15/2018 06:30    Pending Labs Unresulted Labs (From admission, onward)   None      Vitals/Pain Today's Vitals   08/15/18 0620 08/15/18 0624 08/15/18 0630 08/15/18 0715  BP: 97/60 107/81 113/77   Pulse: (!) 52 68 62   Resp: 20 16 16    Temp:      TempSrc:      SpO2: 100% 100% 100%   Weight:      Height:      PainSc:    9     Isolation Precautions No active isolations  Medications Medications  nitroGLYCERIN (NITROSTAT) SL tablet 0.4 mg (0.4 mg Sublingual Given 08/15/18 0611)  fentaNYL (SUBLIMAZE) injection 100 mcg (100 mcg Intravenous Given 08/15/18 0715)  aspirin chewable tablet 324 mg (324 mg Oral Given 08/15/18 0610)  alum & mag hydroxide-simeth (MAALOX/MYLANTA) 200-200-20 MG/5ML suspension 30 mL (30 mLs Oral Given 08/15/18 0609)  acetaminophen (TYLENOL) tablet 1,000 mg (1,000 mg Oral Given 08/15/18 0610)  ondansetron (ZOFRAN) injection 4 mg (4 mg Intravenous Given 08/15/18 0626)  fentaNYL (SUBLIMAZE) injection 50 mcg (50 mcg Intravenous Given 08/15/18 0626)  sodium chloride 0.9 % bolus 500 mL (0 mLs Intravenous Stopped 08/15/18 0718)    Mobility walks Low fall risk   Focused Assessments Cardiac Assessment Handoff:  Cardiac Rhythm: Sinus bradycardia Lab Results  Component Value Date   TROPONINI <0.03 08/15/2018   No  results found for: DDIMER Does the Patient currently have chest pain? Yes     R Recommendations: See Admitting Provider Note  Report given to:   Additional Notes: Fentanyl works well for his chest pain. Pt is scared and wife would like regular updates whenever possible.

## 2018-08-15 NOTE — ED Notes (Signed)
Pt returned to baseline, reports chest pain back up to an 8/10, similar intensity as prior to medications. Skin warm and dry, strong pulses.

## 2018-08-15 NOTE — ED Notes (Signed)
Called [facility] - requested another consult with Cardiology

## 2018-08-15 NOTE — ED Notes (Signed)
Called to room, pt reports numbness in face. Pt diaphoretic, pale, bradycardic at 35BPM. Pt reports not feeling right, but unable to speak. Thready central pulses. ZOLL pads applied, MD at bedside. Repeat EKG performed. Hypotensive. Fluid bolus initiated. Patient slowly became more alert, HR up to the 50s. States chest pain improved but reports not feeling right.

## 2018-08-15 NOTE — Progress Notes (Signed)
Wife calls in to clarify daily meds and is concerned that patient needs his GERD meds d/t significant issues w/GERD.  She also notes patient was stung by a bee/wasp yesterday morning around 9AM.  He did not take his prescribed Epi for bee/wasp stings and wife reports he had no SOB like he normally gets from allergic reaction.  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

## 2018-08-15 NOTE — ED Triage Notes (Signed)
Pt reports L sided chest pain radiating to L arm, reports some shortness of breath. Reports sharp/tight pain.

## 2018-08-15 NOTE — ED Provider Notes (Signed)
[facility] Provider Note   CSN: [id number] Arrival date & time: 08/15/18  0544     History   Chief Complaint Chief Complaint  Patient presents with  . Chest Pain    HPI Travis Kaufman is a 48 y.o. male.     The history is provided by the patient.  Chest Pain Pain location:  L chest Pain quality: sharp and tightness   Pain radiates to:  L arm and L jaw (axilla has occasion sharp pains) Pain severity:  Severe Onset quality:  Gradual Timing:  Constant Progression:  Worsening Chronicity:  New Context: at rest   Relieved by:  Nothing Worsened by:  Nothing Ineffective treatments:  None tried Associated symptoms: shortness of breath   Associated symptoms: no abdominal pain, no anxiety, no back pain, no cough, no diaphoresis, no fatigue, no fever, no headache, no nausea and no palpitations   Associated symptoms comment:  Paresthesia of the left arm  Risk factors: male sex   Risk factors: no smoking and no surgery   Patient with PMH only significant for GERD did not feel well last night before bed and had mild chest pain.  Then awoke with worsening pain.  Went to shower and developed SOB.   No Viagra cialis or levitra.  No travel.  No f/c/r.  No cough.  No pleuritic component.    Past Medical History:  Diagnosis Date  . GERD (gastroesophageal reflux disease)     Patient Active Problem List   Diagnosis Date Noted  . HLD (hyperlipidemia) 07/13/2018  . Nevus 07/13/2018  . Viral upper respiratory tract infection 01/07/2018  . Decreased libido without sexual dysfunction 01/07/2018  . GERD (gastroesophageal reflux disease) 01/07/2018    History reviewed. No pertinent surgical history.      Home Medications    Prior to Admission medications   Medication Sig Start Date End Date Taking? Authorizing Provider  dexlansoprazole (DEXILANT) 60 MG capsule Take 1 capsule (60 mg total) by mouth daily. 07/13/18   Luetta Nutting, DO  EPINEPHrine  0.3 mg/0.3 mL IJ SOAJ injection Inject into the muscle. 09/17/17   [provider]  pantoprazole (PROTONIX) 40 MG tablet Take 1 tablet (40 mg total) by mouth daily. 01/07/18   Luetta Nutting, DO    Family History No family history on file.  Social History Social History   Tobacco Use  . Smoking status: Never Smoker  . Smokeless tobacco: Former Network engineer Use Topics  . Alcohol use: Yes    Comment: occasional   . Drug use: Never     Allergies   Wasp venom   Review of Systems Review of Systems  Constitutional: Negative for diaphoresis, fatigue and fever.  Respiratory: Positive for shortness of breath. Negative for cough.   Cardiovascular: Positive for chest pain. Negative for palpitations and leg swelling.  Gastrointestinal: Negative for abdominal pain and nausea.  Musculoskeletal: Negative for back pain.  Neurological: Negative for headaches.  All other systems reviewed and are negative.    Physical Exam Updated Vital Signs BP 126/85 (BP Location: Right Arm)   Pulse 90   Resp 17   Ht 5\' 11"  (1.803 m)   Wt 86.2 kg   SpO2 100%   BMI 26.50 kg/m   Physical Exam Vitals signs and nursing note reviewed.  Constitutional:      Appearance: He is normal weight. He is not diaphoretic.  HENT:     Head: Normocephalic and atraumatic.  Nose: Nose normal.  Eyes:     Conjunctiva/sclera: Conjunctivae normal.     Pupils: Pupils are equal, round, and reactive to light.  Neck:     Musculoskeletal: Normal range of motion and neck supple.  Cardiovascular:     Rate and Rhythm: Normal rate and regular rhythm.     Pulses: Normal pulses.     Heart sounds: Normal heart sounds.  Pulmonary:     Effort: Pulmonary effort is normal.     Breath sounds: Normal breath sounds. No wheezing.  Abdominal:     General: Abdomen is flat. Bowel sounds are normal.     Tenderness: There is no abdominal tenderness. There is no guarding.  Musculoskeletal: Normal range of motion.      Right lower leg: No edema.     Left lower leg: No edema.  Skin:    General: Skin is warm and dry.     Capillary Refill: Capillary refill takes less than 2 seconds.  Neurological:     General: No focal deficit present.     Mental Status: He is alert and oriented to person, place, and time.  Psychiatric:        Mood and Affect: Mood normal.        Thought Content: Thought content normal.      ED Treatments / Results  Labs (all labs ordered are listed, but only abnormal results are displayed) Results for orders placed or performed during the hospital encounter of 08/15/18  SARS Coronavirus 2 (Hosp order,Performed in [facility] lab via [employer] [facility])   Specimen: Dry Nasal Swab ([employer] Now)  Result Value Ref Range   SARS Coronavirus 2 ([employer] Now) NEGATIVE NEGATIVE  CBC with Differential  Result Value Ref Range   WBC 8.7 4.0 - 10.5 K/uL   RBC 5.15 4.22 - 5.81 MIL/uL   Hemoglobin 16.1 13.0 - 17.0 g/dL   HCT 47.0 39.0 - 52.0 %   MCV 91.3 80.0 - 100.0 fL   MCH 31.3 26.0 - 34.0 pg   MCHC 34.3 30.0 - 36.0 g/dL   RDW 13.4 11.5 - 15.5 %   Platelets 335 150 - 400 K/uL   nRBC 0.0 0.0 - 0.2 %   Neutrophils Relative % 52 %   Neutro Abs 4.5 1.7 - 7.7 K/uL   Lymphocytes Relative 32 %   Lymphs Abs 2.8 0.7 - 4.0 K/uL   Monocytes Relative 10 %   Monocytes Absolute 0.9 0.1 - 1.0 K/uL   Eosinophils Relative 4 %   Eosinophils Absolute 0.4 0.0 - 0.5 K/uL   Basophils Relative 1 %   Basophils Absolute 0.1 0.0 - 0.1 K/uL   Immature Granulocytes 1 %   Abs Immature Granulocytes 0.05 0.00 - 0.07 K/uL  Basic metabolic panel  Result Value Ref Range   Sodium 138 135 - 145 mmol/L   Potassium 3.6 3.5 - 5.1 mmol/L   Chloride 103 98 - 111 mmol/L   CO2 23 22 - 32 mmol/L   Glucose, Bld 116 (H) 70 - 99 mg/dL   BUN 14 6 - 20 mg/dL   Creatinine, Ser 1.13 0.61 - 1.24 mg/dL   Calcium 10.1 8.9 - 10.3 mg/dL   GFR calc non Af Amer >60 >60 mL/min   GFR calc Af Amer >60 >60 mL/min   Anion gap 12 5 - 15   Troponin I - ONCE - STAT  Result Value Ref Range   Troponin I <0.03 <0.03 ng/mL   Dg Chest  Portable 1 View  Result Date: 08/15/2018 CLINICAL DATA:  Acute onset chest and left arm pain approximately 1 hour ago. EXAM: PORTABLE CHEST 1 VIEW COMPARISON:  None. FINDINGS: The heart size and mediastinal contours are within normal limits. Both lungs are clear. The visualized skeletal structures are unremarkable. IMPRESSION: No active disease. Electronically Signed   By: Earle Gell M.D.   On: 08/15/2018 06:30    EKG EKG Interpretation  Date/Time:  Sunday August 15 2018 05:50:21 EDT Ventricular Rate:  94 PR Interval:    QRS Duration: 95 QT Interval:  368 QTC Calculation: 461 R Axis:   55 Text Interpretation:  Sinus rhythm Minimal ST depression, anterolateral leads Confirmed by Randal Buba, Sabino Denning ([id number]) on 08/15/2018 5:59:55 AM   Radiology No results found.  Procedures Procedures (including critical care time)  Medications Ordered in ED Medications  nitroGLYCERIN (NITROSTAT) SL tablet 0.4 mg (0.4 mg Sublingual Given 08/15/18 0611)  ondansetron (ZOFRAN) 4 MG/2ML injection (has no administration in time range)  fentaNYL (SUBLIMAZE) 100 MCG/2ML injection (has no administration in time range)  fentaNYL (SUBLIMAZE) injection 100 mcg (has no administration in time range)  aspirin chewable tablet 324 mg (324 mg Oral Given 08/15/18 0610)  alum & mag hydroxide-simeth (MAALOX/MYLANTA) 200-200-20 MG/5ML suspension 30 mL (30 mLs Oral Given 08/15/18 0609)  acetaminophen (TYLENOL) tablet 1,000 mg (1,000 mg Oral Given 08/15/18 0610)  ondansetron (ZOFRAN) injection 4 mg (4 mg Intravenous Given 08/15/18 0626)  fentaNYL (SUBLIMAZE) injection 50 mcg (50 mcg Intravenous Given 08/15/18 0626)  sodium chloride 0.9 % bolus 500 mL (500 mLs Intravenous New Bag/Given 08/15/18 0626)   Following covid swab and nitro patient became bradycardiac and hypotensive and diaphoretic.  IVF initiated and warm blankets placed.  BP  rebounded immiediately HR into the low 60s.  Patient states pain improved with NTG.  Given BP drop with use fentanyl for pain.     Cardiology fellow re-contacted given negative troponin do not start heparin at this time.     Patient feeling improved post IVF and pain medication but is not pain free will need to go to step down.     Final Clinical Impressions(s) / ED Diagnoses   Concern for ACS will admit for work up.     Phynix Horton, MD 08/15/18 [phone]

## 2018-08-15 NOTE — Progress Notes (Signed)
*  PRELIMINARY RESULTS* Echocardiogram 2D Echocardiogram has been performed.  Travis Kaufman 08/15/2018, 1:44 PM

## 2018-08-16 ENCOUNTER — Observation Stay (HOSPITAL_COMMUNITY): Payer: BC Managed Care – PPO

## 2018-08-16 ENCOUNTER — Encounter (HOSPITAL_COMMUNITY): Payer: Self-pay | Admitting: Radiology

## 2018-08-16 DIAGNOSIS — K219 Gastro-esophageal reflux disease without esophagitis: Secondary | ICD-10-CM

## 2018-08-16 DIAGNOSIS — R079 Chest pain, unspecified: Secondary | ICD-10-CM | POA: Diagnosis not present

## 2018-08-16 DIAGNOSIS — E7800 Pure hypercholesterolemia, unspecified: Secondary | ICD-10-CM | POA: Diagnosis not present

## 2018-08-16 DIAGNOSIS — I2510 Atherosclerotic heart disease of native coronary artery without angina pectoris: Secondary | ICD-10-CM | POA: Diagnosis not present

## 2018-08-16 DIAGNOSIS — R001 Bradycardia, unspecified: Secondary | ICD-10-CM | POA: Diagnosis not present

## 2018-08-16 DIAGNOSIS — I208 Other forms of angina pectoris: Secondary | ICD-10-CM | POA: Diagnosis not present

## 2018-08-16 LAB — BASIC METABOLIC PANEL
Anion gap: 8 (ref 5–15)
Anion gap: 8 (ref 5–15)
BUN: 16 mg/dL (ref 6–20)
BUN: 12 mg/dL (ref 6–20)
CO2: 25 mmol/L (ref 22–32)
CO2: 23 mmol/L (ref 22–32)
Calcium: 8.9 mg/dL (ref 8.9–10.3)
Calcium: 8.6 mg/dL — ABNORMAL LOW (ref 8.9–10.3)
Chloride: 104 mmol/L (ref 98–111)
Chloride: 106 mmol/L (ref 98–111)
Creatinine, Ser: 1.36 mg/dL — ABNORMAL HIGH (ref 0.61–1.24)
Creatinine, Ser: 1.18 mg/dL (ref 0.61–1.24)
GFR calc Af Amer: >60 mL/min (ref >60)
GFR calc Af Amer: >60 mL/min (ref >60)
GFR calc non Af Amer: >60 mL/min (ref >60)
GFR calc non Af Amer: >60 mL/min (ref >60)
Glucose, Bld: 100 mg/dL — ABNORMAL HIGH (ref 70–99)
Glucose, Bld: 115 mg/dL — ABNORMAL HIGH (ref 70–99)
Potassium: 3.9 mmol/L (ref 3.5–5.1)
Potassium: 3.8 mmol/L (ref 3.5–5.1)
Sodium: 137 mmol/L (ref 135–145)
Sodium: 137 mmol/L (ref 135–145)

## 2018-08-16 LAB — LIPID PANEL
Cholesterol: 243 mg/dL — ABNORMAL HIGH (ref 0–200)
HDL: 43 mg/dL (ref >40)
LDL Cholesterol: 167 mg/dL — ABNORMAL HIGH (ref 0–99)
Total CHOL/HDL Ratio: 5.7 RATIO
Triglycerides: 163 mg/dL — ABNORMAL HIGH (ref <150)
VLDL: 33 mg/dL (ref 0–40)

## 2018-08-16 LAB — HIV ANTIBODY (ROUTINE TESTING W REFLEX): HIV Screen 4th Generation wRfx: NONREACTIVE

## 2018-08-16 LAB — TROPONIN I: Troponin I: <0.03 ng/mL (ref <0.03)

## 2018-08-16 MED ORDER — SODIUM CHLORIDE 0.9 % WEIGHT BASED INFUSION: 3.0000 mL/kg/h | INTRAVENOUS | Status: DC

## 2018-08-16 MED ORDER — SODIUM CHLORIDE 0.9 % IV SOLN
INTRAVENOUS | Status: DC
  Administered 2018-08-16 – 2018-08-17 (×2): via INTRAVENOUS

## 2018-08-16 MED ORDER — SODIUM CHLORIDE 0.9% FLUSH: 3.0000 mL | INTRAVENOUS | Status: DC | PRN

## 2018-08-16 MED ORDER — SODIUM CHLORIDE 0.9% FLUSH
3.0000 mL | Freq: Two times a day (BID) | INTRAVENOUS | Status: DC
  Administered 2018-08-16 – 2018-08-17 (×2): 3 mL via INTRAVENOUS

## 2018-08-16 MED ORDER — SODIUM CHLORIDE 0.9 % WEIGHT BASED INFUSION: 1.0000 mL/kg/h | INTRAVENOUS | Status: DC

## 2018-08-16 MED ORDER — SODIUM CHLORIDE 0.9 % IV SOLN: 250.0000 mL | INTRAVENOUS | Status: DC | PRN

## 2018-08-16 MED ORDER — IOHEXOL 350 MG/ML SOLN
80.0000 mL | Freq: Once | INTRAVENOUS | Status: AC | PRN
  Administered 2018-08-16: 12:00:00 80 mL via INTRAVENOUS

## 2018-08-16 MED ORDER — NITROGLYCERIN 0.4 MG SL SUBL
SUBLINGUAL_TABLET | SUBLINGUAL | Status: AC
  Administered 2018-08-16: 0.4 mg via SUBLINGUAL
  Filled 2018-08-16: qty 1

## 2018-08-16 MED ORDER — SODIUM CHLORIDE 0.9 % IV SOLN
INTRAVENOUS | Status: AC
  Administered 2018-08-16: 11:00:00 via INTRAVENOUS

## 2018-08-16 MED ORDER — ASPIRIN 81 MG PO CHEW
81.0000 mg | CHEWABLE_TABLET | ORAL | Status: AC
  Administered 2018-08-17: 81 mg via ORAL
  Filled 2018-08-16: qty 1

## 2018-08-16 MED ORDER — ALPRAZOLAM 0.25 MG PO TABS: 0.2500 mg | ORAL_TABLET | Freq: Every evening | ORAL | Status: DC | PRN

## 2018-08-16 NOTE — Progress Notes (Addendum)
Progress Note  Patient Name: Travis Kaufman Date of Encounter: 08/16/2018  Primary Cardiologist: No primary care provider on file.   Subjective   Denies any SOB.  Had some mild CP.  HR in the 40-50's.  He says it normally is in the 50's at home  Inpatient Medications    Scheduled Meds: . aspirin EC  81 mg Oral Daily  . enoxaparin (LOVENOX) injection  40 mg Subcutaneous Q24H  . pantoprazole  40 mg Oral Daily   Continuous Infusions:  PRN Meds: acetaminophen, fentaNYL (SUBLIMAZE) injection, nitroGLYCERIN, nitroGLYCERIN, ondansetron (ZOFRAN) IV   Vital Signs    Vitals:   08/16/18 0000 08/16/18 0400 08/16/18 0500 08/16/18 0734  BP: 92/63  121/76 115/70  Pulse: (!) 44 (!) 41 (!) 54 (!) 49  Resp:   12 12  Temp:   98.3 F (36.8 C) 98.3 F (36.8 C)  TempSrc:   Oral Oral  SpO2: 95% 97% 98% 96%  Weight:   82.1 kg   Height:        Intake/Output Summary (Last 24 hours) at 08/16/2018 1047 Last data filed at 08/16/2018 0739 Gross per 24 hour  Intake 580 ml  Output 1960 ml  Net -1380 ml   Filed Weights   08/15/18 0551 08/16/18 0500  Weight: 86.2 kg 82.1 kg    Telemetry    Sinus bradycardia - Personally Reviewed  ECG    No new EKG to review - Personally Reviewed  Physical Exam   GEN: No acute distress.   Neck: No JVD Cardiac: RRR, no murmurs, rubs, or gallops.  Respiratory: Clear to auscultation bilaterally. GI: Soft, nontender, non-distended  MS: No edema; No deformity. Neuro:  Nonfocal  Psych: Normal affect   Labs    Chemistry Recent Labs  Lab 08/15/18 0559 08/16/18 0034  NA 138 137  K 3.6 3.9  CL 103 104  CO2 23 25  GLUCOSE 116* 100*  BUN 14 16  CREATININE 1.13 1.36*  CALCIUM 10.1 8.9  GFRNONAA >60 >60  GFRAA >60 >60  ANIONGAP 12 8     Hematology Recent Labs  Lab 08/15/18 0559  WBC 8.7  RBC 5.15  HGB 16.1  HCT 47.0  MCV 91.3  MCH 31.3  MCHC 34.3  RDW 13.4  PLT 335    Cardiac Enzymes Recent Labs  Lab 08/15/18 0559  08/15/18 1135 08/15/18 1629 08/16/18 0034  TROPONINI <0.03 <0.03 <0.03 <0.03   No results for input(s): TROPIPOC in the last 168 hours.   BNPNo results for input(s): BNP, PROBNP in the last 168 hours.   DDimer No results for input(s): DDIMER in the last 168 hours.   Radiology    Dg Chest Portable 1 View  Result Date: 08/15/2018 CLINICAL DATA:  Acute onset chest and left arm pain approximately 1 hour ago. EXAM: PORTABLE CHEST 1 VIEW COMPARISON:  None. FINDINGS: The heart size and mediastinal contours are within normal limits. Both lungs are clear. The visualized skeletal structures are unremarkable. IMPRESSION: No active disease. Electronically Signed   By: Earle Gell M.D.   On: 08/15/2018 06:30    Cardiac Studies   2D echo pending  Patient Profile     48 y.o. male with past medical history of GERD and no prior cardiac history who presented to [facility] on 08/15/2018 for evaluation of chest pain.   Assessment & Plan    1.  Left arm pain and CP -occurs with rest or exertion -no fm hx of  CAD and no significant CRFs except for elevated LDL -Trop negative x 3 -Initial EKG with slight ST depression in the anterolatearl leads -2D echo pending -continue ASA -no BB due to resting bradycardia -plan coronary CTA today to define coronary anatomy  2.  Bradycardia -he tells me his HR has always been in the upper 40's to 50's -Says this is his typical baseline and HR increases into the 70's to 80's with activity.  -he is asymptomatic -avoid rate slowing meds  3.  GERD -continue on PPI  4.  AKI -creatinine mildly elevated 1.13>1.36 -? Volume depletion -will give 250cc IVF bolus fluid -creatinine still ok for CT  For questions or updates, please contact [place] Please consult [email or web address] for contact info under Cardiology/STEMI.      Signed, Fransico Him, MD  08/16/2018, 10:47 AM

## 2018-08-16 NOTE — Progress Notes (Signed)
Paged provider requesting to call patients wife. Wife has requested to speak with cardiologist in regards to patients care.

## 2018-08-16 NOTE — Progress Notes (Signed)
Patient had a restful night however, through out the night his HR sustained at 43 to 44 while sleeping. The lowest HR was 37 for about 2 minutes. Patient stated he is not an athlete and he does not do any running or cardio exercises. He stated he suffers with his Jerrye Bushy daily and he would like to find a good GI doctor before he goes home. Patients blood pressure sustained within normal limits through out the night. See flowsheet. Will inform MD of patients low HR though out the night.

## 2018-08-16 NOTE — Progress Notes (Addendum)
   Patient was taken to [facility]. Per Dr. Radford Pax the nurse then called with concern about giving patient NTG preemptively for test as he had reported a bad reaction to it in the ER with HR/BP dropping. Dr. Radford Pax felt that event was likely a vagal reaction to Covid swab than related to the NTG. Therefore SL NTG was given. He then became very claustrophobic in the scanner with HR up to the 90s so CT was cancelled. (HR needs to be 50s-60s for adequate images, but pt was not appropriate for administration of beta blocker due to earlier bradycardia.) Patient presented instead for stress test as ordered by MD, but the patient declined to proceed as he does not feel he can remain NPO any longer. He reports h/o GERD and esophageal polyps that cause significant issue if he goes too long without food. The patient was unwilling to wait any longer therefore test cancelled for today and reordered for tomorrow.  Addendum 08/16/2018 4:26 PM: Spoke with wife this PM to give update. She raised significant concerns about recent symptoms as well as his worsening bradycardia which seems unusual for him. He has had a low resting HR in the past but for him to periodically drop into the 30s is unusual. He is not athletic so this is not assumed to be resting bradycardia from high physical fitness. She says it is not like like him to complain about these kinds of things so she is concerned about the nuclear stress test giving a subpar answer. Dr. Radford Pax reviewed symptoms and findings with patient including episode of recurrent bradycardia this AM. Although CT was unable to be completed in full, the report was processed this PM showing focal calcification in the ostial RCA. The concern would be whether this could be potentially contributing to his abnormal EKG and worsened bradycardia. Therefore she has recommended cath instead of stress test tomorrow for definitive answer. She discussed plan/procedure with the patient. He is on for 9am  tomorrow. Plan gentle hydration with repeat BMET and CBC in AM. The patient also reports episodically spitting up blood while NPO the last few months, which is why he was so concerned as above about being NPO til mid afternoon. Hgb stable on admission. He has h/o severe GERD. His PCP recently changed him to [place] with plan to refer back to GI if symptoms unimproved. Dr. Radford Pax would like for him to be seen by GI as inpatient with potential plan to scope if cath unrevealing. Spoke with [facility] to request consult (pt had EGD 2 yrs ago with Dr. Amedeo Plenty). Wife updated with plan. I also spoke with nurse.  GI requests that patient be kept NPO AFTER cath in case availability for scope in PM opens up.    PA-C

## 2018-08-17 ENCOUNTER — Observation Stay (HOSPITAL_COMMUNITY): Payer: BC Managed Care – PPO | Admitting: Certified Registered"

## 2018-08-17 ENCOUNTER — Encounter (HOSPITAL_COMMUNITY)
Admission: EM | Disposition: A | Discharge status: Home / Self Care | Payer: Self-pay | Source: Home / Self Care | Attending: Emergency Medicine

## 2018-08-17 ENCOUNTER — Encounter (HOSPITAL_COMMUNITY): Payer: Self-pay | Admitting: *Deleted

## 2018-08-17 ENCOUNTER — Other Ambulatory Visit: Payer: Self-pay | Admitting: Physician Assistant

## 2018-08-17 DIAGNOSIS — I2510 Atherosclerotic heart disease of native coronary artery without angina pectoris: Secondary | ICD-10-CM

## 2018-08-17 DIAGNOSIS — K210 Gastro-esophageal reflux disease with esophagitis: Secondary | ICD-10-CM | POA: Diagnosis not present

## 2018-08-17 DIAGNOSIS — R0789 Other chest pain: Secondary | ICD-10-CM | POA: Diagnosis not present

## 2018-08-17 DIAGNOSIS — N179 Acute kidney failure, unspecified: Secondary | ICD-10-CM

## 2018-08-17 DIAGNOSIS — R079 Chest pain, unspecified: Secondary | ICD-10-CM | POA: Diagnosis not present

## 2018-08-17 DIAGNOSIS — R001 Bradycardia, unspecified: Secondary | ICD-10-CM

## 2018-08-17 DIAGNOSIS — E7800 Pure hypercholesterolemia, unspecified: Secondary | ICD-10-CM | POA: Diagnosis not present

## 2018-08-17 DIAGNOSIS — K228 Other specified diseases of esophagus: Secondary | ICD-10-CM | POA: Diagnosis not present

## 2018-08-17 DIAGNOSIS — K3189 Other diseases of stomach and duodenum: Secondary | ICD-10-CM | POA: Diagnosis not present

## 2018-08-17 DIAGNOSIS — K2950 Unspecified chronic gastritis without bleeding: Secondary | ICD-10-CM | POA: Diagnosis not present

## 2018-08-17 DIAGNOSIS — K219 Gastro-esophageal reflux disease without esophagitis: Secondary | ICD-10-CM | POA: Diagnosis not present

## 2018-08-17 DIAGNOSIS — R12 Heartburn: Secondary | ICD-10-CM | POA: Diagnosis not present

## 2018-08-17 HISTORY — PX: BIOPSY: SHX5522

## 2018-08-17 HISTORY — PX: LEFT HEART CATH AND CORONARY ANGIOGRAPHY: CATH118249

## 2018-08-17 HISTORY — PX: ESOPHAGOGASTRODUODENOSCOPY (EGD) WITH PROPOFOL: SHX5813

## 2018-08-17 LAB — BASIC METABOLIC PANEL
Anion gap: 8 (ref 5–15)
BUN: 13 mg/dL (ref 6–20)
CO2: 26 mmol/L (ref 22–32)
Calcium: 8.7 mg/dL — ABNORMAL LOW (ref 8.9–10.3)
Chloride: 104 mmol/L (ref 98–111)
Creatinine, Ser: 1.04 mg/dL (ref 0.61–1.24)
GFR calc Af Amer: >60 mL/min (ref >60)
GFR calc non Af Amer: >60 mL/min (ref >60)
Glucose, Bld: 111 mg/dL — ABNORMAL HIGH (ref 70–99)
Potassium: 3.8 mmol/L (ref 3.5–5.1)
Sodium: 138 mmol/L (ref 135–145)

## 2018-08-17 LAB — CBC
HCT: 42.2 % (ref 39.0–52.0)
Hemoglobin: 14.1 g/dL (ref 13.0–17.0)
MCH: 31.1 pg (ref 26.0–34.0)
MCHC: 33.4 g/dL (ref 30.0–36.0)
MCV: 93 fL (ref 80.0–100.0)
Platelets: 272 10*3/uL (ref 150–400)
RBC: 4.54 MIL/uL (ref 4.22–5.81)
RDW: 13.3 % (ref 11.5–15.5)
WBC: 7.7 10*3/uL (ref 4.0–10.5)
nRBC: 0 % (ref 0.0–0.2)

## 2018-08-17 LAB — ECHOCARDIOGRAM COMPLETE
Height: 71 in
Weight: 3040 oz

## 2018-08-17 LAB — TSH: TSH: 1.468 u[IU]/mL (ref 0.350–4.500)

## 2018-08-17 SURGERY — ESOPHAGOGASTRODUODENOSCOPY (EGD) WITH PROPOFOL
Anesthesia: Monitor Anesthesia Care

## 2018-08-17 SURGERY — LEFT HEART CATH AND CORONARY ANGIOGRAPHY
Anesthesia: LOCAL

## 2018-08-17 MED ORDER — FENTANYL CITRATE (PF) 100 MCG/2ML IJ SOLN
INTRAMUSCULAR | Status: AC
  Filled 2018-08-17: qty 2

## 2018-08-17 MED ORDER — LIDOCAINE 2% (20 MG/ML) 5 ML SYRINGE
INTRAMUSCULAR | Status: DC | PRN
  Administered 2018-08-17: 80 mg via INTRAVENOUS

## 2018-08-17 MED ORDER — ATORVASTATIN CALCIUM 40 MG PO TABS: 40.0000 mg | ORAL_TABLET | Freq: Every day | ORAL | Status: DC

## 2018-08-17 MED ORDER — MIDAZOLAM HCL 2 MG/2ML IJ SOLN
INTRAMUSCULAR | Status: AC
  Filled 2018-08-17: qty 2

## 2018-08-17 MED ORDER — HEPARIN SODIUM (PORCINE) 1000 UNIT/ML IJ SOLN
INTRAMUSCULAR | Status: DC | PRN
  Administered 2018-08-17: 4000 [IU] via INTRAVENOUS

## 2018-08-17 MED ORDER — ATORVASTATIN CALCIUM 40 MG PO TABS: 40.0000 mg | ORAL_TABLET | Freq: Every evening | ORAL | 3 refills | Status: DC

## 2018-08-17 MED ORDER — SODIUM CHLORIDE 0.9 % IV SOLN: INTRAVENOUS | Status: DC

## 2018-08-17 MED ORDER — HEPARIN (PORCINE) IN NACL 1000-0.9 UT/500ML-% IV SOLN
INTRAVENOUS | Status: DC | PRN
  Administered 2018-08-17 (×2): 500 mL

## 2018-08-17 MED ORDER — SODIUM CHLORIDE 0.9% FLUSH: 3.0000 mL | Freq: Two times a day (BID) | INTRAVENOUS | Status: DC

## 2018-08-17 MED ORDER — PANTOPRAZOLE SODIUM 40 MG IV SOLR
40.0000 mg | Freq: Once | INTRAVENOUS | Status: AC
  Administered 2018-08-17: 40 mg via INTRAVENOUS
  Filled 2018-08-17: qty 40

## 2018-08-17 MED ORDER — SODIUM CHLORIDE 0.9 % IV SOLN
250.0000 mL | INTRAVENOUS | Status: DC | PRN
  Administered 2018-08-17: 13:00:00 via INTRAVENOUS

## 2018-08-17 MED ORDER — SODIUM CHLORIDE 0.9 % IV SOLN
INTRAVENOUS | Status: AC
  Administered 2018-08-17: 11:00:00 via INTRAVENOUS

## 2018-08-17 MED ORDER — HYDRALAZINE HCL 20 MG/ML IJ SOLN: 10.0000 mg | INTRAMUSCULAR | Status: AC | PRN

## 2018-08-17 MED ORDER — SODIUM CHLORIDE 0.9% FLUSH: 3.0000 mL | INTRAVENOUS | Status: DC | PRN

## 2018-08-17 MED ORDER — HEPARIN SODIUM (PORCINE) 1000 UNIT/ML IJ SOLN
INTRAMUSCULAR | Status: AC
  Filled 2018-08-17: qty 1

## 2018-08-17 MED ORDER — LABETALOL HCL 5 MG/ML IV SOLN: 10.0000 mg | INTRAVENOUS | Status: AC | PRN

## 2018-08-17 MED ORDER — LIDOCAINE HCL (PF) 1 % IJ SOLN
INTRAMUSCULAR | Status: DC | PRN
  Administered 2018-08-17: 2 mL

## 2018-08-17 MED ORDER — LIDOCAINE HCL (PF) 1 % IJ SOLN
INTRAMUSCULAR | Status: AC
  Filled 2018-08-17: qty 30

## 2018-08-17 MED ORDER — VERAPAMIL HCL 2.5 MG/ML IV SOLN
INTRAVENOUS | Status: AC
  Filled 2018-08-17: qty 2

## 2018-08-17 MED ORDER — PROPOFOL 10 MG/ML IV BOLUS
INTRAVENOUS | Status: DC | PRN
  Administered 2018-08-17: 120 mg via INTRAVENOUS
  Administered 2018-08-17: 50 mg via INTRAVENOUS
  Administered 2018-08-17: 100 mg via INTRAVENOUS

## 2018-08-17 MED ORDER — FENTANYL CITRATE (PF) 100 MCG/2ML IJ SOLN
INTRAMUSCULAR | Status: DC | PRN
  Administered 2018-08-17: 25 ug via INTRAVENOUS

## 2018-08-17 MED ORDER — VERAPAMIL HCL 2.5 MG/ML IV SOLN
INTRAVENOUS | Status: DC | PRN
  Administered 2018-08-17: 10 mL via INTRA_ARTERIAL

## 2018-08-17 MED ORDER — IOHEXOL 350 MG/ML SOLN
INTRAVENOUS | Status: DC | PRN
  Administered 2018-08-17: 80 mL via INTRA_ARTERIAL

## 2018-08-17 MED ORDER — HEPARIN (PORCINE) IN NACL 1000-0.9 UT/500ML-% IV SOLN
INTRAVENOUS | Status: AC
  Filled 2018-08-17: qty 1000

## 2018-08-17 MED ORDER — MIDAZOLAM HCL 2 MG/2ML IJ SOLN
INTRAMUSCULAR | Status: DC | PRN
  Administered 2018-08-17: 2 mg via INTRAVENOUS

## 2018-08-17 SURGICAL SUPPLY — 15 items

## 2018-08-17 SURGICAL SUPPLY — 10 items
CATH 5FR JL3.5 JR4 ANG PIG MP (CATHETERS) ×1 IMPLANT
DEVICE RAD COMP TR BAND LRG (VASCULAR PRODUCTS) ×1 IMPLANT
GLIDESHEATH SLEND SS 6F .021 (SHEATH) ×1 IMPLANT
GUIDEWIRE INQWIRE 1.5J.035X260 (WIRE) IMPLANT
INQWIRE 1.5J .035X260CM (WIRE) ×2
KIT HEART LEFT (KITS) ×2 IMPLANT
PACK CARDIAC CATHETERIZATION (CUSTOM PROCEDURE TRAY) ×2 IMPLANT
SYR MEDRAD MARK 7 150ML (SYRINGE) ×2 IMPLANT
TRANSDUCER W/STOPCOCK (MISCELLANEOUS) ×2 IMPLANT
TUBING CIL FLEX 10 FLL-RA (TUBING) ×2 IMPLANT

## 2018-08-17 NOTE — Progress Notes (Addendum)
Progress Note  Patient Name: Travis Kaufman Date of Encounter: 08/17/2018  Primary Cardiologist: No primary care provider on file.   Subjective   No further chest pain or SOB.  HR 48 to 52bpm and as low as 41bpm overnight  Inpatient Medications    Scheduled Meds: . aspirin EC  81 mg Oral Daily  . enoxaparin (LOVENOX) injection  40 mg Subcutaneous Q24H  . pantoprazole  40 mg Oral Daily  . pantoprazole (PROTONIX) IV  40 mg Intravenous Once  . sodium chloride flush  3 mL Intravenous Q12H   Continuous Infusions: . sodium chloride    . sodium chloride 75 mL/hr at 08/17/18 0400   PRN Meds: sodium chloride, acetaminophen, ALPRAZolam, fentaNYL (SUBLIMAZE) injection, nitroGLYCERIN, nitroGLYCERIN, ondansetron (ZOFRAN) IV, sodium chloride flush   Vital Signs    Vitals:   08/17/18 0334 08/17/18 0500 08/17/18 0612 08/17/18 0759  BP: 110/65   117/70  Pulse: (!) 43  (!) 52   Resp: 12     Temp: 97.9 F (36.6 C)   98.3 F (36.8 C)  TempSrc: Oral   Oral  SpO2: 96%  97%   Weight:  82 kg    Height:        Intake/Output Summary (Last 24 hours) at 08/17/2018 0807 Last data filed at 08/17/2018 5427 Gross per 24 hour  Intake 789.87 ml  Output 1725 ml  Net -935.13 ml   Filed Weights   08/15/18 0551 08/16/18 0500 08/17/18 0500  Weight: 86.2 kg 82.1 kg 82 kg    Telemetry    Sinus bradycardia - Personally Reviewed  ECG    No new EKG to review - Personally Reviewed  Physical Exam   GEN: Well nourished, well developed in no acute distress HEENT: Normal NECK: No JVD; No carotid bruits LYMPHATICS: No lymphadenopathy CARDIAC:RRR, no murmurs, rubs, gallops RESPIRATORY:  Clear to auscultation without rales, wheezing or rhonchi  ABDOMEN: Soft, non-tender, non-distended MUSCULOSKELETAL:  No edema; No deformity  SKIN: Warm and dry NEUROLOGIC:  Alert and oriented x 3 PSYCHIATRIC:  Normal affect    Labs    Chemistry Recent Labs  Lab 08/16/18 0034 08/16/18 1529 08/17/18  0254  NA 137 137 138  K 3.9 3.8 3.8  CL 104 106 104  CO2 25 23 26   GLUCOSE 100* 115* 111*  BUN 16 12 13   CREATININE 1.36* 1.18 1.04  CALCIUM 8.9 8.6* 8.7*  GFRNONAA >60 >60 >60  GFRAA >60 >60 >60  ANIONGAP 8 8 8      Hematology Recent Labs  Lab 08/15/18 0559 08/17/18 0254  WBC 8.7 7.7  RBC 5.15 4.54  HGB 16.1 14.1  HCT 47.0 42.2  MCV 91.3 93.0  MCH 31.3 31.1  MCHC 34.3 33.4  RDW 13.4 13.3  PLT 335 272    Cardiac Enzymes Recent Labs  Lab 08/15/18 0559 08/15/18 1135 08/15/18 1629 08/16/18 0034  TROPONINI <0.03 <0.03 <0.03 <0.03   No results for input(s): TROPIPOC in the last 168 hours.   BNPNo results for input(s): BNP, PROBNP in the last 168 hours.   DDimer No results for input(s): DDIMER in the last 168 hours.   Radiology    Ct Cardiac Scoring  Result Date: 08/16/2018 EXAM: OVER-READ INTERPRETATION  CT CHEST The following report is an over-read performed by radiologist Dr. Abigail Miyamoto of [facility], [place] on 08/16/2018. This over-read does not include interpretation of cardiac or coronary anatomy or pathology. The calcium score interpretation by the cardiologist is attached. COMPARISON:  Chest radiograph of 08/15/2018 FINDINGS: Vascular: Normal aortic caliber. Mediastinum/Nodes: No imaged thoracic adenopathy. Lungs/Pleura: No imaged pleural fluid. Right lower lobe subsegmental atelectasis or scar. Upper Abdomen: Normal imaged portions of the liver, spleen, stomach. Musculoskeletal: No acute osseous abnormality. IMPRESSION: No acute findings in the imaged extracardiac chest. Electronically Signed   By: Abigail Miyamoto M.D.   On: 08/16/2018 12:57    Cardiac Studies   2D echo pending  Patient Profile     48 y.o. male with past medical history of GERD and no prior cardiac history who presented to [facility] on 08/15/2018 for evaluation of chest pain.   Assessment & Plan    1.  Left arm pain and CP -occurs with rest or exertion but he describes it  as "indigestion" -no fm hx of CAD and no significant CRFs except for elevated LDL -Trop negative x 3 -Initial EKG with  ST depression in the anterolateral leads -2D echo pending -unable to do coronary CTA yesterday due to claustraphobia and inability to get HR down -after further discussion with wife and patient it was decided to proceed with left heart cath today to define coronary anatomy.  He has been having episodes of severe "burning" in chest with pain under his left axilla and left arm.  He also has been having brady episodes that come on at any time and he gets diaphoretic and dizzy all concerning for possible coronary ischemia.  A calcium score was obtained yesterday during attempts at CTA and was abnormal at 4.7 in the ostial RCA.   -continue ASA -no BB due to resting bradycardia -cardiac cath today -Cardiac catheterization was discussed with the patient fully. The patient understands that risks include but are not limited to stroke (1 in 1000), death (1 in 49), kidney failure [usually temporary] (1 in 500), bleeding (1 in 200), allergic reaction [possibly serious] (1 in 200).  The patient understands and is willing to proceed.    2.  Bradycardia -he tells me his HR has always been in the upper 40's to 50's -Says this is his typical baseline and HR increases into the 70's to 80's with activity.  -he has had several episodes here where his HR will drop into the 30's and he becomes diaphoretic and dizzy.  One episode occurred immediately after his nasal swab for COVID ? Vagal vs. ischemia -avoid rate slowing meds -is cath normal may need outpt event montior to correlate bradycardia with sx  3.  GERD -he has been having a lot of what he calls "indigestion" sx which have been getting worse over the past months and now associated with occasional spitting up of blood -continue on PPI -GI has been consulted ? EGD today after cath  4.  AKI -creatinine mildly elevated 1.13>1.36 -? Volume  depletion -IVF given overnight -creatinine improved today to 1.04  5.  Hyperlipidemia -LDL goal with coronary Cal is < 70 -his LDL was 167 -start on Lipitor 40mg  daily -check FLP and ALT in 6 weeks  For questions or updates, please contact [place] Please consult [email or web address] for contact info under Cardiology/STEMI.      Signed, Fransico Him, MD  08/17/2018, 8:07 AM

## 2018-08-17 NOTE — Progress Notes (Signed)
Pt left unit in wheelchair accompanied by Rn. 

## 2018-08-17 NOTE — Anesthesia Postprocedure Evaluation (Signed)
Anesthesia Post Note  Patient: Travis Kaufman  Procedure(s) Performed: ESOPHAGOGASTRODUODENOSCOPY (EGD) WITH PROPOFOL (N/A ) BIOPSY     Patient location during evaluation: PACU Anesthesia Type: MAC Level of consciousness: awake and alert Pain management: pain level controlled Vital Signs Assessment: post-procedure vital signs reviewed and stable Respiratory status: spontaneous breathing, nonlabored ventilation, respiratory function stable and patient connected to nasal cannula oxygen Cardiovascular status: stable and blood pressure returned to baseline Postop Assessment: no apparent nausea or vomiting Anesthetic complications: no    Last Vitals:  Vitals:   08/17/18 1350 08/17/18 1412  BP: 113/60 100/66  Pulse: (!) 48   Resp: 15   Temp:  36.7 C  SpO2: 96%     Last Pain:  Vitals:   08/17/18 1412  TempSrc: Oral  PainSc:                  Effie Berkshire

## 2018-08-17 NOTE — Anesthesia Procedure Notes (Signed)
Procedure Name: MAC Date/Time: 08/17/2018 1:19 PM Performed by: Orlie Dakin, CRNA Pre-anesthesia Checklist: Patient identified, Emergency Drugs available, Suction available and Patient being monitored Patient Re-evaluated:Patient Re-evaluated prior to induction Oxygen Delivery Method: Nasal cannula Preoxygenation: Pre-oxygenation with 100% oxygen Induction Type: IV induction

## 2018-08-17 NOTE — Op Note (Signed)
[facility] Patient Name: Travis Kaufman Procedure Date : 08/17/2018 MRN: [id number] Attending MD: Ronnette Juniper , MD Date of Birth: [birth year] CSN: [id number] Age: 48 Admit Type: Inpatient Procedure:                Upper GI endoscopy Indications:              Heartburn, Suspected gastro-esophageal reflux                            disease, chest pain Providers:                Ronnette Juniper, MD, Baird Cancer, RN, Marguerita Merles,                            Technician Referring MD:              Medicines:                Monitored Anesthesia Care Complications:            No immediate complications. Estimated blood loss:                            None. Estimated Blood Loss:     Estimated blood loss: none. Procedure:                Pre-Anesthesia Assessment:                           - Prior to the procedure, a History and Physical                            was performed, and patient medications and                            allergies were reviewed. The patient's tolerance of                            previous anesthesia was also reviewed. The risks                            and benefits of the procedure and the sedation                            options and risks were discussed with the patient.                            All questions were answered, and informed consent                            was obtained. Prior Anticoagulants: The patient has                            taken no previous anticoagulant or antiplatelet                            agents. ASA Grade Assessment: II - A  patient with                            mild systemic disease. After reviewing the risks                            and benefits, the patient was deemed in                            satisfactory condition to undergo the procedure.                           After obtaining informed consent, the endoscope was                            passed under direct vision. Throughout the             procedure, the patient's blood pressure, pulse, and                            oxygen saturations were monitored continuously. The                            GIF-H190 ([id number]) Olympus gastroscope was                            introduced through the mouth, and advanced to the                            second part of duodenum. The upper GI endoscopy was                            accomplished without difficulty. The patient                            tolerated the procedure well. Scope In: Scope Out: Findings:      The Z-line was irregular and was found 40 cm from the incisors. Mucosa       was biopsied with a cold forceps for histology in a targeted manner at       the gastroesophageal junction. One specimen bottle was sent to pathology.      The upper third of the esophagus, middle third of the esophagus and       lower third of the esophagus were normal.      Localized mildly erythematous mucosa without bleeding was found in the       gastric antrum. Biopsies were taken with a cold forceps for Helicobacter       pylori testing.      The cardia and gastric fundus were normal on retroflexion.      Localized mildly erythematous mucosa without active bleeding and with no       stigmata of bleeding was found in the duodenal bulb.      The first portion of the duodenum and second portion of the duodenum       were normal. Impression:               -  Z-line irregular, 40 cm from the incisors.                            Biopsied.                           - Normal upper third of esophagus, middle third of                            esophagus and lower third of esophagus.                           - Erythematous mucosa in the antrum. Biopsied.                           - Erythematous duodenopathy.                           - Normal first portion of the duodenum and second                            portion of the duodenum. Moderate Sedation:      Patient did not receive moderate  sedation for this procedure, but       instead received monitored anesthesia care. Recommendation:           - Resume regular diet.                           - Continue present medications.                           - Await pathology results.                           - Aggressive anti reflux measures such as weight                            loss, elevate head end of the bed during sleep,                            avoid or limit caffeinated products and space last                            meal of the day and bedtime by at least 3 hours. Procedure Code(s):        --- Professional ---                           [phone], Esophagogastroduodenoscopy, flexible,                            transoral; with biopsy, single or multiple Diagnosis Code(s):        --- Professional ---                           K22.8, Other specified diseases of esophagus  K31.89, Other diseases of stomach and duodenum                           R12, Heartburn CPT copyright 2019 [employer]. All rights reserved. The codes documented in this report are preliminary and upon coder review may  be revised to meet current compliance requirements. Ronnette Juniper, MD 08/17/2018 1:26:55 PM This report has been signed electronically. Number of Addenda: 0

## 2018-08-17 NOTE — Progress Notes (Signed)
Patient scheduled to have EGD following cath this am.  Per bedside RN, patient's access site started bleeding after beginning to deflate TR band, band had been reinflated per policy.  Dr. Radford Pax notified and stated ok to continue with EGD today with continued monitoring of TR band and site.    Vista Lawman, RN

## 2018-08-17 NOTE — Op Note (Signed)
EGD showed  Normal esophagus, irregular Z line, biopsies taken to r/o barrett's. Erythematous antrum, biopsies taken to r/o H pylori. Mild erythema in duodenum. Otherwise, unremarkable.  Recommendations: Pathology may be followed as outpatient. Resume regular diet. Life style modification to decrease acid reflux- avoid/limit caffeinated products, avoid smoking and limit alcohol consumption, space last meal of the day and bedtime by at least 3 hours. Resume dexilant 60 mg daily. If he remains symptomatic despite above measures, recommend surgical evaluation for fundoplication as an outpatient.  Travis Kaufman

## 2018-08-17 NOTE — Progress Notes (Signed)
Pt left for cath lab.

## 2018-08-17 NOTE — Consult Note (Signed)
[facility] Consult  Referring Provider: Sueanne Margarita, MD Primary Care Physician:  Luetta Nutting, DO Primary Gastroenterologist: [facility]  Reason for Consultation: Severe acid reflux, vomiting blood intermittently  HPI: Travis Kaufman is a 48 y.o. male was in his usual state of health until 5 days ago when he developed pain near his left armpit described as sharp, stabbing, nonradiating, not associated with nausea or vomiting but shooting down his leg.  He was admitted on 08/15/2018 with EKG changes of slight ST depression sinus bradycardia heart rate 38 and is undergoing cardiac catheterization today as he was unable to do coronary CTA due to claustrophobia and inability to get heart rate down.  Patient states he has had acid reflux and heartburn for at least 5 years.  Last EGD was performed in 2018 which showed LA grade D esophagitis.  He has been on ranitidine in the past, omeprazole twice a day and recently was started on Dexilant 6 weeks ago.  He describes his symptoms of heartburn as worsening on empty stomach, not associated with difficulty swallowing or pain on swallowing.  He states that his father had similar problems and needed a fundoplication and his sister also suffers from severe reflux.  Patient drinks 1 cup of coffee a day, takes carbonated soda for indigestion, drinks wine every other day, denies smoking, denies any recent weight changes.  He denies change in bowel habits, black or bloody stools. There is no family history of esophageal, stomach, gallbladder or pancreatic cancer. Patient states that if he goes many hours without eating his acid reflux will worsen and he will start tasting blood in his mouth and sometimes will spit up blood described as small in amount, bright red can occur anywhere from once a day to once in several weeks.   Past Medical History:  Diagnosis Date  . GERD (gastroesophageal reflux disease)     History reviewed. No pertinent  surgical history.  Prior to Admission medications   Medication Sig Start Date End Date Taking? Authorizing Provider  dexlansoprazole (DEXILANT) 60 MG capsule Take 1 capsule (60 mg total) by mouth daily. 07/13/18  Yes Luetta Nutting, DO  EPINEPHrine 0.3 mg/0.3 mL IJ SOAJ injection Inject 0.3 mg into the muscle as needed for anaphylaxis.  09/17/17   [provider]    Current Facility-Administered Medications  Medication Dose Route Frequency Provider Last Rate Last Dose  . 0.9 %  sodium chloride infusion  250 mL Intravenous PRN Dunn, Dayna N, PA-C      . 0.9 %  sodium chloride infusion   Intravenous Continuous Dunn, Dayna N, PA-C 75 mL/hr at 08/17/18 0813    . acetaminophen (TYLENOL) tablet 650 mg  650 mg Oral Q4H PRN Ahmed Prima, Fransisco Hertz, PA-C      . ALPRAZolam Duanne Moron) tablet 0.25 mg  0.25 mg Oral QHS PRN Dunn, Dayna N, PA-C      . aspirin EC tablet 81 mg  81 mg Oral Daily Bernerd Pho M, PA-C   81 mg at 08/16/18 1031  . enoxaparin (LOVENOX) injection 40 mg  40 mg Subcutaneous Q24H Bernerd Pho M, PA-C   40 mg at 08/16/18 1139  . fentaNYL (SUBLIMAZE) injection 25-50 mcg  25-50 mcg Intravenous Q1H PRN Marcie Mowers, MD   25 mcg at 08/17/18 0615  . nitroGLYCERIN (NITROSTAT) SL tablet 0.4 mg  0.4 mg Sublingual Q5 min PRN Bernerd Pho M, PA-C   0.4 mg at 08/15/18 4098  . nitroGLYCERIN (NITROSTAT) SL tablet 0.4  mg  0.4 mg Sublingual Q5 Min x 3 PRN Bernerd Pho M, PA-C   0.4 mg at 08/16/18 1225  . ondansetron (ZOFRAN) injection 4 mg  4 mg Intravenous Q6H PRN Strader, [place] M, PA-C      . pantoprazole (PROTONIX) EC tablet 40 mg  40 mg Oral Daily Bernerd Pho M, PA-C   40 mg at 08/16/18 1031  . sodium chloride flush (NS) 0.9 % injection 3 mL  3 mL Intravenous Q12H Dunn, Dayna N, PA-C   3 mL at 08/17/18 0815  . sodium chloride flush (NS) 0.9 % injection 3 mL  3 mL Intravenous PRN Dunn, Dayna N, PA-C        Allergies as of 08/15/2018 - Review Complete  08/15/2018  Allergen Reaction Noted  . Wasp venom Anaphylaxis 09/17/2017    Family History  Problem Relation Age of Onset  . GER disease Father     Social History   Socioeconomic History  . Marital status: Married    Spouse name: Not on file  . Number of children: Not on file  . Years of education: Not on file  . Highest education level: Not on file  Occupational History  . Not on file  Social Needs  . Financial resource strain: Not on file  . Food insecurity    Worry: Not on file    Inability: Not on file  . Transportation needs    Medical: Not on file    Non-medical: Not on file  Tobacco Use  . Smoking status: Never Smoker  . Smokeless tobacco: Former Network engineer and Sexual Activity  . Alcohol use: Yes    Comment: occasional   . Drug use: Never  . Sexual activity: Not on file  Lifestyle  . Physical activity    Days per week: Not on file    Minutes per session: Not on file  . Stress: Not on file  Relationships  . Social Herbalist on phone: Not on file    Gets together: Not on file    Attends religious service: Not on file    Active member of club or organization: Not on file    Attends meetings of clubs or organizations: Not on file    Relationship status: Not on file  . Intimate partner violence    Fear of current or ex partner: Not on file    Emotionally abused: Not on file    Physically abused: Not on file    Forced sexual activity: Not on file  Other Topics Concern  . Not on file  Social History Narrative  . Not on file    Review of Systems: GI: Described in detail in HPI.    Gen: Denies any fever, chills, rigors, night sweats, anorexia, fatigue, weakness, malaise, involuntary weight loss, and sleep disorder CV: Denies chest pain, angina, palpitations, syncope, orthopnea, PND, peripheral edema, and claudication. Resp: Denies dyspnea, cough, sputum, wheezing, coughing up blood. GU : Denies urinary burning, blood in urine, urinary  frequency, urinary hesitancy, nocturnal urination, and urinary incontinence. MS: Denies joint pain or swelling.  Denies muscle weakness, cramps, atrophy.  Derm: Denies rash, itching, oral ulcerations, hives, unhealing ulcers.  Psych: Denies depression, anxiety, memory loss, suicidal ideation, hallucinations,  and confusion. Heme: Denies bruising, bleeding, and enlarged lymph nodes. Neuro:  Denies any headaches, dizziness, paresthesias. Endo:  Denies any problems with DM, thyroid, adrenal function.  Physical Exam: Vital signs in last 24 hours: Temp:  [97.9 F (  36.6 C)-98.6 F (37 C)] 98.3 F (36.8 C) (06/16 0759) Pulse Rate:  [43-55] 52 (06/16 0612) Resp:  [12-145] 12 (06/16 0334) BP: (109-118)/(65-72) 117/70 (06/16 0759) SpO2:  [96 %-98 %] 97 % (06/16 0612) Weight:  [82 kg] 82 kg (06/16 0500) Last BM Date: 08/15/18  General:   Alert,  Well-developed, well-nourished, pleasant and cooperative in NAD Head:  Normocephalic and atraumatic. Eyes:  Sclera clear, no icterus.   Conjunctiva pink. Ears:  Normal auditory acuity. Nose:  No deformity, discharge,  or lesions. Mouth:  No deformity or lesions.  Oropharynx pink & moist. Neck:  Supple; no masses or thyromegaly. Lungs:  Clear throughout to auscultation.   No wheezes, crackles, or rhonchi. No acute distress. Heart: Bradycardia; no murmurs, clicks, rubs,  or gallops. Extremities:  Without clubbing or edema. Neurologic:  Alert and  oriented x4;  grossly normal neurologically. Skin:  Intact without significant lesions or rashes. Psych:  Alert and cooperative. Normal mood and affect. Abdomen:  Soft, nontender and nondistended. No masses, hepatosplenomegaly or hernias noted. Normal bowel sounds, without guarding, and without rebound.         Lab Results: Recent Labs    08/15/18 0559 08/17/18 0254  WBC 8.7 7.7  HGB 16.1 14.1  HCT 47.0 42.2  PLT 335 272   BMET Recent Labs    08/16/18 0034 08/16/18 1529 08/17/18 0254  NA 137  137 138  K 3.9 3.8 3.8  CL 104 106 104  CO2 25 23 26   GLUCOSE 100* 115* 111*  BUN 16 12 13   CREATININE 1.36* 1.18 1.04  CALCIUM 8.9 8.6* 8.7*   LFT No results for input(s): PROT, ALBUMIN, AST, ALT, ALKPHOS, BILITOT, BILIDIR, IBILI in the last 72 hours. PT/INR No results for input(s): LABPROT, INR in the last 72 hours.  Studies/Results: Ct Cardiac Scoring  Result Date: 08/16/2018 EXAM: OVER-READ INTERPRETATION  CT CHEST The following report is an over-read performed by radiologist Dr. Abigail Miyamoto of [facility], [place] on 08/16/2018. This over-read does not include interpretation of cardiac or coronary anatomy or pathology. The calcium score interpretation by the cardiologist is attached. COMPARISON:  Chest radiograph of 08/15/2018 FINDINGS: Vascular: Normal aortic caliber. Mediastinum/Nodes: No imaged thoracic adenopathy. Lungs/Pleura: No imaged pleural fluid. Right lower lobe subsegmental atelectasis or scar. Upper Abdomen: Normal imaged portions of the liver, spleen, stomach. Musculoskeletal: No acute osseous abnormality. IMPRESSION: No acute findings in the imaged extracardiac chest. Electronically Signed   By: Abigail Miyamoto M.D.   On: 08/16/2018 12:57    Impression: Left arm and chest pain, cardiac catheterization planned today. Severe acid reflux and heartburn, on Dexilant at home, last EGD 2018. ?  Spitting blood due to severe acid reflux, worse after prolonged fasting state Normal hemoglobin 14.1, normal MCV 93, normal platelet 272, normal BUN/creatinine ratio of 13/1.04  Plan: EGD today if cardiac catheterization is unremarkable As patient is n.p.o. advised to give Protonix 40 mg IV 1 dose today instead of oral pantoprazole. The risks and the benefits of the procedure were discussed with the patient in details. Verbalizes understanding and consents.    LOS: 0 days   Ronnette Juniper, MD  08/17/2018, 8:22 AM  Pager [phone] If no answer or after 5 PM call [phone]

## 2018-08-17 NOTE — Progress Notes (Signed)
Cath unrevealing, OK to proceed with GI workup. Sent Epic chat message/page to Dr. Therisa Doyne via [facility] answering service, awaiting call back.

## 2018-08-17 NOTE — Brief Op Note (Signed)
08/15/2018 - 08/17/2018  1:27 PM  PATIENT:  Travis Kaufman  48 y.o. male  PRE-OPERATIVE DIAGNOSIS:  Severe  reflux, chest pain  POST-OPERATIVE DIAGNOSIS:  Gastric antrum biopsy, GE junction biopsy r/o Barretts  PROCEDURE:  Procedure(s): ESOPHAGOGASTRODUODENOSCOPY (EGD) WITH PROPOFOL (N/A) BIOPSY  SURGEON:  Surgeon(s) and Role:    Ronnette Juniper, MD - Primary  PHYSICIAN ASSISTANT:   ASSISTANTS: Lynett Fish, Marguerita Merles, Tech  ANESTHESIA:   MAC  EBL:  Minimal  BLOOD ADMINISTERED:none  DRAINS: none   LOCAL MEDICATIONS USED:  NONE  SPECIMEN:  Biopsy / Limited Resection  DISPOSITION OF SPECIMEN:  PATHOLOGY  COUNTS:  YES  TOURNIQUET:  * No tourniquets in log *  DICTATION: .Dragon Dictation  PLAN OF CARE: Admit to inpatient   PATIENT DISPOSITION:  PACU - hemodynamically stable.   Delay start of Pharmacological VTE agent (>24hrs) due to surgical blood loss or risk of bleeding: not applicable

## 2018-08-17 NOTE — Interval H&P Note (Signed)
History and Physical Interval Note:  08/17/2018 9:29 AM  Travis Kaufman  has presented today for surgery, with the diagnosis of chest pain.  The various methods of treatment have been discussed with the patient and family. After consideration of risks, benefits and other options for treatment, the patient has consented to  Procedure(s): LEFT HEART CATH AND CORONARY ANGIOGRAPHY (N/A) as a surgical intervention.  The patient's history has been reviewed, patient examined, no change in status, stable for surgery.  I have reviewed the patient's chart and labs.  Questions were answered to the patient's satisfaction.    Cath Lab Visit (complete for each Cath Lab visit)  Clinical Evaluation Leading to the Procedure:   ACS: No.  Non-ACS:    Anginal Classification: CCS III  Anti-ischemic medical therapy: No Therapy  Non-Invasive Test Results: No non-invasive testing performed  Prior CABG: No previous CABG         [name]

## 2018-08-17 NOTE — H&P (View-Only) (Signed)
Progress Note  Patient Name: Travis Kaufman Date of Encounter: 08/17/2018  Primary Cardiologist: No primary care provider on file.   Subjective   No further chest pain or SOB.  HR 48 to 52bpm and as low as 41bpm overnight  Inpatient Medications    Scheduled Meds: . aspirin EC  81 mg Oral Daily  . enoxaparin (LOVENOX) injection  40 mg Subcutaneous Q24H  . pantoprazole  40 mg Oral Daily  . pantoprazole (PROTONIX) IV  40 mg Intravenous Once  . sodium chloride flush  3 mL Intravenous Q12H   Continuous Infusions: . sodium chloride    . sodium chloride 75 mL/hr at 08/17/18 0400   PRN Meds: sodium chloride, acetaminophen, ALPRAZolam, fentaNYL (SUBLIMAZE) injection, nitroGLYCERIN, nitroGLYCERIN, ondansetron (ZOFRAN) IV, sodium chloride flush   Vital Signs    Vitals:   08/17/18 0334 08/17/18 0500 08/17/18 0612 08/17/18 0759  BP: 110/65   117/70  Pulse: (!) 43  (!) 52   Resp: 12     Temp: 97.9 F (36.6 C)   98.3 F (36.8 C)  TempSrc: Oral   Oral  SpO2: 96%  97%   Weight:  82 kg    Height:        Intake/Output Summary (Last 24 hours) at 08/17/2018 0807 Last data filed at 08/17/2018 7001 Gross per 24 hour  Intake 789.87 ml  Output 1725 ml  Net -935.13 ml   Filed Weights   08/15/18 0551 08/16/18 0500 08/17/18 0500  Weight: 86.2 kg 82.1 kg 82 kg    Telemetry    Sinus bradycardia - Personally Reviewed  ECG    No new EKG to review - Personally Reviewed  Physical Exam   GEN: Well nourished, well developed in no acute distress HEENT: Normal NECK: No JVD; No carotid bruits LYMPHATICS: No lymphadenopathy CARDIAC:RRR, no murmurs, rubs, gallops RESPIRATORY:  Clear to auscultation without rales, wheezing or rhonchi  ABDOMEN: Soft, non-tender, non-distended MUSCULOSKELETAL:  No edema; No deformity  SKIN: Warm and dry NEUROLOGIC:  Alert and oriented x 3 PSYCHIATRIC:  Normal affect    Labs    Chemistry Recent Labs  Lab 08/16/18 0034 08/16/18 1529 08/17/18  0254  NA 137 137 138  K 3.9 3.8 3.8  CL 104 106 104  CO2 25 23 26   GLUCOSE 100* 115* 111*  BUN 16 12 13   CREATININE 1.36* 1.18 1.04  CALCIUM 8.9 8.6* 8.7*  GFRNONAA >60 >60 >60  GFRAA >60 >60 >60  ANIONGAP 8 8 8      Hematology Recent Labs  Lab 08/15/18 0559 08/17/18 0254  WBC 8.7 7.7  RBC 5.15 4.54  HGB 16.1 14.1  HCT 47.0 42.2  MCV 91.3 93.0  MCH 31.3 31.1  MCHC 34.3 33.4  RDW 13.4 13.3  PLT 335 272    Cardiac Enzymes Recent Labs  Lab 08/15/18 0559 08/15/18 1135 08/15/18 1629 08/16/18 0034  TROPONINI <0.03 <0.03 <0.03 <0.03   No results for input(s): TROPIPOC in the last 168 hours.   BNPNo results for input(s): BNP, PROBNP in the last 168 hours.   DDimer No results for input(s): DDIMER in the last 168 hours.   Radiology    Ct Cardiac Scoring  Result Date: 08/16/2018 EXAM: OVER-READ INTERPRETATION  CT CHEST The following report is an over-read performed by radiologist Dr. Abigail Miyamoto of [facility], [place] on 08/16/2018. This over-read does not include interpretation of cardiac or coronary anatomy or pathology. The calcium score interpretation by the cardiologist is attached. COMPARISON:  Chest radiograph of 08/15/2018 FINDINGS: Vascular: Normal aortic caliber. Mediastinum/Nodes: No imaged thoracic adenopathy. Lungs/Pleura: No imaged pleural fluid. Right lower lobe subsegmental atelectasis or scar. Upper Abdomen: Normal imaged portions of the liver, spleen, stomach. Musculoskeletal: No acute osseous abnormality. IMPRESSION: No acute findings in the imaged extracardiac chest. Electronically Signed   By: Abigail Miyamoto M.D.   On: 08/16/2018 12:57    Cardiac Studies   2D echo pending  Patient Profile     48 y.o. male with past medical history of GERD and no prior cardiac history who presented to [place] on 08/15/2018 for evaluation of chest pain.   Assessment & Plan    1.  Left arm pain and CP -occurs with rest or exertion but he describes it  as "indigestion" -no fm hx of CAD and no significant CRFs except for elevated LDL -Trop negative x 3 -Initial EKG with  ST depression in the anterolateral leads -2D echo pending -unable to do coronary CTA yesterday due to claustraphobia and inability to get HR down -after further discussion with wife and patient it was decided to proceed with left heart cath today to define coronary anatomy.  He has been having episodes of severe "burning" in chest with pain under his left axilla and left arm.  He also has been having brady episodes that come on at any time and he gets diaphoretic and dizzy all concerning for possible coronary ischemia.  A calcium score was obtained yesterday during attempts at CTA and was abnormal at 4.7 in the ostial RCA.   -continue ASA -no BB due to resting bradycardia -cardiac cath today -Cardiac catheterization was discussed with the patient fully. The patient understands that risks include but are not limited to stroke (1 in 1000), death (1 in 54), kidney failure [usually temporary] (1 in 500), bleeding (1 in 200), allergic reaction [possibly serious] (1 in 200).  The patient understands and is willing to proceed.    2.  Bradycardia -he tells me his HR has always been in the upper 40's to 50's -Says this is his typical baseline and HR increases into the 70's to 80's with activity.  -he has had several episodes here where his HR will drop into the 30's and he becomes diaphoretic and dizzy.  One episode occurred immediately after his nasal swab for COVID ? Vagal vs. ischemia -avoid rate slowing meds -is cath normal may need outpt event montior  3.  GERD -he has been having a lot of what he calls "indigestion" sx which have been getting worse over the past months and now associated with occasional spitting up of blood -continue on PPI -GI has been consulted ? EGD today after cath  4.  AKI -creatinine mildly elevated 1.13>1.36 -? Volume depletion -IVF given overnight  -creatinine improved today to 1.04  5.  Hyperlipidemia -LDL goal with coronary Cal is < 70 -his LDL was 167 -start on Lipitor 40mg  daily -check FLP and ALT in 6 weeks  For questions or updates, please contact [place] Please consult [email or web address] for contact info under Cardiology/STEMI.      Signed, Fransico Him, MD  08/17/2018, 8:07 AM

## 2018-08-17 NOTE — Progress Notes (Addendum)
Pt left for endo department with Tr band on as he had to have an examination done. Returned at 1420hrs.

## 2018-08-17 NOTE — Anesthesia Preprocedure Evaluation (Addendum)
Anesthesia Evaluation  Patient identified by MRN, date of birth, ID band Patient awake    Reviewed: Allergy & Precautions, NPO status , Patient's Chart, lab work & pertinent test results  Airway Mallampati: I  TM Distance: >3 FB Neck ROM: Full    Dental  (+) Teeth Intact, Dental Advisory Given   Pulmonary neg pulmonary ROS,    breath sounds clear to auscultation       Cardiovascular  Rhythm:Regular Rate:Bradycardia     Neuro/Psych negative neurological ROS  negative psych ROS   GI/Hepatic Neg liver ROS, GERD  ,  Endo/Other  negative endocrine ROS  Renal/GU negative Renal ROS     Musculoskeletal negative musculoskeletal ROS (+)   Abdominal Normal abdominal exam  (+)   Peds  Hematology negative hematology ROS (+)   Anesthesia Other Findings   Reproductive/Obstetrics                            Anesthesia Physical Anesthesia Plan  ASA: II  Anesthesia Plan: MAC   Post-op Pain Management:    Induction: Intravenous  PONV Risk Score and Plan: 1 and Propofol infusion  Airway Management Planned: Natural Airway and Nasal Cannula  Additional Equipment: None  Intra-op Plan:   Post-operative Plan:   Informed Consent: I have reviewed the patients History and Physical, chart, labs and discussed the procedure including the risks, benefits and alternatives for the proposed anesthesia with the patient or authorized representative who has indicated his/her understanding and acceptance.       Plan Discussed with: CRNA  Anesthesia Plan Comments:        Anesthesia Quick Evaluation

## 2018-08-17 NOTE — Transfer of Care (Signed)
Immediate Anesthesia Transfer of Care Note  Patient: Travis Kaufman  Procedure(s) Performed: ESOPHAGOGASTRODUODENOSCOPY (EGD) WITH PROPOFOL (N/A ) BIOPSY  Patient Location: Endoscopy Unit  Anesthesia Type:MAC  Level of Consciousness: awake and patient cooperative  Airway & Oxygen Therapy: Patient Spontanous Breathing and Patient connected to nasal cannula oxygen  Post-op Assessment: Report given to RN and Post -op Vital signs reviewed and stable  Post vital signs: Reviewed and stable  Last Vitals:  Vitals Value Taken Time  BP    Temp    Pulse    Resp    SpO2      Last Pain:  Vitals:   08/17/18 1238  TempSrc: Oral  PainSc: 0-No pain      Patients Stated Pain Goal: 0 (83/66/29 4765)  Complications: No apparent anesthesia complications

## 2018-08-17 NOTE — Discharge Summary (Addendum)
Discharge Summary    Patient ID: Travis Kaufman,  MRN: [id number], DOB/AGE: [birth year] 48 y.o.  Admit date: 08/15/2018 Discharge date: 08/17/2018  Primary Care Provider: Luetta Nutting Primary Cardiologist: Fransico Him, MD Primary Electrophysiologist:  None  Discharge Diagnoses    Principal Problem:   Chest pain Active Problems:   GERD (gastroesophageal reflux disease)   HLD (hyperlipidemia)   AKI (acute kidney injury) ([place])   Bradycardia   Diagnostic Studies/Procedures    Cath 08/17/18 Conclusion The left ventricular systolic function is normal.  LV end diastolic pressure is normal.  The left ventricular ejection fraction is greater than 65% by visual estimate.  There is no mitral valve regurgitation.   1. No angiographic evidence of CAD 2. Normal LV systolic function 3. Non-cardiac chest pain  No further ischemic workup   2D echo 08/15/18 IMPRESSIONS  1. The left ventricle has hyperdynamic systolic function, with an ejection fraction of >65%. The cavity size was normal. Left ventricular diastolic parameters were normal. No evidence of left ventricular regional wall motion abnormalities.  2. The right ventricle has normal systolic function. The cavity was normal. There is no increase in right ventricular wall thickness. Right ventricular systolic pressure could not be assessed.  3. The aortic valve is grossly normal.   _____________     History of Present Illness     Travis Kaufman is a 48 y.o. male with history of GERD and no prior cardiac history presented to [facility] with chest pain and left arm pain.  Mr. 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 He denies any recent chest pain or dyspnea on exertion when performing these activities. The morning prior to admission, he reported being stung by a bee on his leg. He had a previous allergic reaction and carries an Epi pen with him but that day he did  not develop any hives, therefore he did not utilize this. He was in his normal state of health throughout the day the evening when he developed a shooting pain down his left arm. His pain improved but around 0400 the pain awoke him from sleep. He had associated dyspnea at that time which prompted him to come to the ED. He also had indigestion-like chest pain. In the ED, WBC 8.7, Hgb 16.1, platelets 335, Na+ 138, K+ 3.6, and creatinine 1.13. COVID testing negative. Initial troponin negative with cyclic values pending. CXR shows no active cardiopulmonary disease. EKG shows NSR, HR 94, with slight ST depression along anterolateral leads. He also developed chest pain. While in the ER, he developed an episode of bradycardia associated with numbness in his face with HR 35bpm. He was hypotensive and treated with fluids. EKG showed sinus bradycardia with HR 38. This was felt possibly a vagal reaction related to his Covid swab given temporal location to the event. He was admitted by cardiology for further evaluation.   Hospital Course    1.  Left arm pain and CP - troponins were negative x3. Cardiac CT was attempted but the patient developed claustrophobia with HR response to the 90s so this was cancelled. Beta blocker was not used to lower HR given bradycardia intermittently observed during admission. Nuclear stress test was considered. However, cardiac CT had shown potential area of calcium at ostial RCA. Given concern for ischemic etiology of bradycardia, definitive cath was recommended. This was performed today showing no evidence of CAD and normal LVEF. 2D Echo showed normal LV function, normal  diastolic parameters, trivial MR, no concerning findings and no pericardial effusion.  2.  Bradycardia - patient had unusual episode in the ER with sinus bradycardia with HR to 30s felt due to vagal response from Covid swab. He reported baseline HR in 40s-50s. During admission he had nocturnal bradycardia as well. Rate  controlling meds were avoided. We will arrange 30 day OP monitor to assess for any relationship to symptoms. Dr. Radford Pax would like to arrange 4 week EP f/u as well. TSH was normal.  3.  GERD - the patient was recently switched to [place] as OP. He reported longstanding hx of worsening GERD. He also reported intermittently spitting up blood which could happen once a day or once every few weeks. No frank bleeding otherwise or melena. Hgb was normal. GI was consulted. EGD was performed showing normal esophagus, irregular Z line, biopsies taken to r/o Barrett's, erythematous antrum, biopsies taken to r/o H pylori, mild erythema in duodenum, and otherwise unremarkable. Dr. Therisa Doyne will follow pathology as OP. She recommended life style modification to decrease acid reflux- avoid/limit caffeinated products, avoid smoking and limit alcohol consumption, space last meal of the day and bedtime by at least 3 hours, and resume dexilant. If he remains symptomatic despite above measures, she recommends surgical evaluation for fundoplication as an outpatient. He was advised to call their office for a f/u appt.  4.  AKI - the patient had mildly elevated Cr of 1.36 felt possibly due to volume depletion. This normalized with IV fluids.  5.  Hyperlipidemia - -LDL goal with coronary Cal is < 70. His LDL was 167. He was started on atorvastatin 40mg  daily. If the patient is tolerating statin at time of follow-up appointment, would consider rechecking liver function/lipid panel in 6-8 weeks. We have recommended he follow up with primary care for this.  I could not actively reach scheduling in the office to arrange his follow-up so sent message to our scheduling team to arrange 30 day event monitor as well as EP f/u as outlined above. The office will call pt with these instructions. Dr. Radford Pax has seen and examined the patient today and feels he is stable for discharge. The patient feels good and wants to go home. Post-cath  instructions as outlined below.  _____________  Discharge Vitals Blood pressure 100/66, pulse (!) 48, temperature 98.1 F (36.7 C), temperature source Oral, resp. rate 15, height 5\' 11"  (1.803 m), weight 82 kg, SpO2 96 %.  Filed Weights   08/15/18 0551 08/16/18 0500 08/17/18 0500  Weight: 86.2 kg 82.1 kg 82 kg    Labs & Radiologic Studies    CBC Recent Labs    08/15/18 0559 08/17/18 0254  WBC 8.7 7.7  NEUTROABS 4.5  --   HGB 16.1 14.1  HCT 47.0 42.2  MCV 91.3 93.0  PLT 335 924   Basic Metabolic Panel Recent Labs    08/16/18 1529 08/17/18 0254  NA 137 138  K 3.8 3.8  CL 106 104  CO2 23 26  GLUCOSE 115* 111*  BUN 12 13  CREATININE 1.18 1.04  CALCIUM 8.6* 8.7*   Cardiac Enzymes Recent Labs    08/15/18 1135 08/15/18 1629 08/16/18 0034  TROPONINI <0.03 <0.03 <0.03   Fasting Lipid Panel Recent Labs    08/16/18 0034  CHOL 243*  HDL 43  LDLCALC 167*  TRIG 163*  CHOLHDL 5.7   Thyroid Function Tests Recent Labs    08/17/18 0254  TSH 1.468   _____________  Ct Cardiac  Scoring  Result Date: 08/16/2018 EXAM: OVER-READ INTERPRETATION  CT CHEST The following report is an over-read performed by radiologist Dr. Abigail Miyamoto of [facility], [place] on 08/16/2018. This over-read does not include interpretation of cardiac or coronary anatomy or pathology. The calcium score interpretation by the cardiologist is attached. COMPARISON:  Chest radiograph of 08/15/2018 FINDINGS: Vascular: Normal aortic caliber. Mediastinum/Nodes: No imaged thoracic adenopathy. Lungs/Pleura: No imaged pleural fluid. Right lower lobe subsegmental atelectasis or scar. Upper Abdomen: Normal imaged portions of the liver, spleen, stomach. Musculoskeletal: No acute osseous abnormality. IMPRESSION: No acute findings in the imaged extracardiac chest. Electronically Signed   By: Abigail Miyamoto M.D.   On: 08/16/2018 12:57   Dg Chest Portable 1 View  Addendum Date: 08/16/2018   ADDENDUM REPORT:  08/16/2018 13:12 CLINICAL DATA:  Risk stratification EXAM: Coronary Calcium Score TECHNIQUE: The patient was scanned on a [employer]. Axial non-contrast 3 mm slices were carried out through the heart. The data set was analyzed on a dedicated work station and scored using the [place]. FINDINGS: Non-cardiac: See separate report from [facility]. Ascending Aorta: Normal caliber with no atherosclerosis Pericardium: Normal Coronary arteries: Normal coronary origins with focal calcification in the ostial RCA. IMPRESSION: Coronary calcium score of 4.7. This was 80th percentile for age and sex matched control. Focal calcification noted in the ostial RCA. Fransico Him, MD Electronically Signed   By: Fransico Him   On: 08/16/2018 13:12   Result Date: 08/16/2018 CLINICAL DATA:  Acute onset chest and left arm pain approximately 1 hour ago. EXAM: PORTABLE CHEST 1 VIEW COMPARISON:  None. FINDINGS: The heart size and mediastinal contours are within normal limits. Both lungs are clear. The visualized skeletal structures are unremarkable. IMPRESSION: No active disease. Electronically Signed: By: Earle Gell M.D. On: 08/15/2018 06:30   Disposition   Pt is being discharged home today in good condition.  Follow-up Plans & Appointments    Follow-up Information    Ronnette Juniper, MD. Schedule an appointment as soon as possible for a visit in 2 day(s).   Specialty: Gastroenterology Contact information: [place] [address] [phone]        Luetta Nutting, DO Follow up in 6 week(s).   Specialty: Family Medicine Why: To follow up your cholesterol Contact information: [place] [address] [phone]        [place] [facility] Follow up.   Specialty: Cardiology Why: [place] office will call you to arrange your heart monitor and a follow-up appointment with one of our heart rhythm specialists. Contact  information: [address] [place] [phone]         Discharge Instructions    Diet - low sodium heart healthy   Complete by: As directed    Discharge instructions   Complete by: As directed    You have been started on atorvastatin for your cholesterol. Your LDL was elevated at 167 ("the bad cholesterol"). Please follow up with your primary care doctor to have this rechecked in 6-8 weeks. We also recommend healthy lifestyle changes as well.  While you were admitted, your labs showed you were a little dehydrated. Make sure you are staying well hydrated at home with water.  No driving for 2 days. No lifting over 10 lbs for 1 week. No sexual activity for 1 week. You may return to work in 2 days if feeling well. Keep procedure site clean &  dry. If you notice increased pain, swelling, bleeding or pus, call/return!  You may shower, but no soaking baths/hot tubs/pools for 1 week.  Per Dr. Therisa Doyne, she recommends the following: "Pathology may be followed as outpatient. Resume regular diet. Life style modification to decrease acid reflux- avoid/limit caffeinated products, avoid smoking and limit alcohol consumption, space last meal of the day and bedtime by at least 3 hours. Resume dexilant 60 mg daily. If he remains symptomatic despite above measures, recommend surgical evaluation for fundoplication as an outpatient."   Increase activity slowly   Complete by: As directed       Discharge Medications   Allergies as of 08/17/2018      Reactions   Wasp Venom Anaphylaxis   Hives, Swelling, Shortness of Breath      Medication List    TAKE these medications   atorvastatin 40 MG tablet Commonly known as: LIPITOR Take 1 tablet (40 mg total) by mouth every evening.   dexlansoprazole 60 MG capsule Commonly known as: Dexilant Take 1 capsule (60 mg total) by mouth daily.   EPINEPHrine 0.3 mg/0.3 mL Soaj injection Commonly known as: EPI-PEN Inject  0.3 mg into the muscle as needed for anaphylaxis.        Allergies:  Allergies  Allergen Reactions   Wasp Venom Anaphylaxis    Hives, Swelling, Shortness of Breath     Outstanding Labs/Studies   N/A  Duration of Discharge Encounter   Greater than 30 minutes including physician time.  Signed, Charlie Pitter PA-C 08/17/2018, 4:25 PM

## 2018-08-18 ENCOUNTER — Encounter (HOSPITAL_COMMUNITY): Payer: Self-pay | Admitting: Cardiovascular Disease

## 2018-08-18 ENCOUNTER — Telehealth: Payer: Self-pay | Admitting: *Deleted

## 2018-08-18 NOTE — Telephone Encounter (Signed)
[employer] to ship 30 day cardiac event monitor to patients home.  Instructions reviewed briefly as they are included in the monitor kit.  Patient inquired about follow up visit.  Scheduling to contact to set up post monitor EP follow up.

## 2018-08-25 NOTE — Telephone Encounter (Signed)
Patient has not received event monitor yet. Calling to check on status.

## 2018-08-26 ENCOUNTER — Telehealth: Payer: Self-pay | Admitting: Cardiology

## 2018-08-26 NOTE — Telephone Encounter (Signed)
[employer] contacted.  Monitor [id number] has been shipped.  UPS tracking indicates package was left at front door at 1132 today.  Please call [employer] at [phone], if you did not receive your package today.

## 2018-08-26 NOTE — Telephone Encounter (Signed)
Per shelly see telephone note from 6/17 [employer] contacted.  Monitor [id number] has been shipped.  UPS tracking indicates package was left at front door at 1132 today.  Please call [employer] at [phone], if you did not receive your package today.

## 2018-08-26 NOTE — Telephone Encounter (Signed)
Wife called. The patient has not received his heart monitor in the mail, and she is calling to check on the status of the monitor.  Patient is leaving to go out of town on Sunday

## 2018-09-07 DIAGNOSIS — K210 Gastro-esophageal reflux disease with esophagitis: Secondary | ICD-10-CM | POA: Diagnosis not present

## 2018-09-07 DIAGNOSIS — R0789 Other chest pain: Secondary | ICD-10-CM | POA: Diagnosis not present

## 2018-09-13 ENCOUNTER — Other Ambulatory Visit: Payer: Self-pay | Admitting: Gastroenterology

## 2018-09-13 MED ORDER — SODIUM CHLORIDE 0.9 % IV SOLN: INTRAVENOUS | Status: DC

## 2018-09-21 ENCOUNTER — Other Ambulatory Visit (HOSPITAL_COMMUNITY)
Admission: RE | Admit: 2018-09-21 | Discharge: 2018-09-21 | Disposition: A | Discharge status: Home / Self Care | Payer: BC Managed Care – PPO | Source: Ambulatory Visit | Attending: Gastroenterology | Admitting: Gastroenterology

## 2018-09-21 DIAGNOSIS — Z1159 Encounter for screening for other viral diseases: Secondary | ICD-10-CM | POA: Insufficient documentation

## 2018-09-21 LAB — SARS CORONAVIRUS 2 (TAT 6-24 HRS): SARS Coronavirus 2: NEGATIVE

## 2018-09-23 ENCOUNTER — Telehealth: Payer: Self-pay

## 2018-09-24 ENCOUNTER — Encounter (HOSPITAL_COMMUNITY)
Admission: RE | Disposition: A | Discharge status: Home / Self Care | Payer: Self-pay | Source: Home / Self Care | Attending: Gastroenterology

## 2018-09-24 ENCOUNTER — Ambulatory Visit (HOSPITAL_COMMUNITY)
Admission: RE | Admit: 2018-09-24 | Discharge: 2018-09-24 | Disposition: A | Discharge status: Home / Self Care | Payer: BC Managed Care – PPO | Attending: Gastroenterology | Admitting: Gastroenterology

## 2018-09-24 DIAGNOSIS — R12 Heartburn: Secondary | ICD-10-CM | POA: Insufficient documentation

## 2018-09-24 HISTORY — PX: ESOPHAGEAL MANOMETRY: SHX5429

## 2018-09-24 SURGERY — MANOMETRY, ESOPHAGUS

## 2018-09-24 MED ORDER — LIDOCAINE VISCOUS HCL 2 % MT SOLN
OROMUCOSAL | Status: AC
  Filled 2018-09-24: qty 15

## 2018-09-24 SURGICAL SUPPLY — 2 items
FACESHIELD LNG OPTICON STERILE (SAFETY) IMPLANT
GLOVE BIO SURGEON STRL SZ8 (GLOVE) ×6 IMPLANT

## 2018-09-24 NOTE — Progress Notes (Signed)
Esophageal manometry performed per protocol. Patient tolerated well.  Dr. Michail Sermon notified.

## 2018-09-26 ENCOUNTER — Encounter (HOSPITAL_COMMUNITY): Payer: Self-pay | Admitting: Gastroenterology

## 2018-09-28 DIAGNOSIS — K210 Gastro-esophageal reflux disease with esophagitis: Secondary | ICD-10-CM | POA: Diagnosis not present

## 2018-09-28 NOTE — Telephone Encounter (Signed)
Left message regarding appt on 09/29/18.

## 2018-09-29 ENCOUNTER — Telehealth (INDEPENDENT_AMBULATORY_CARE_PROVIDER_SITE_OTHER): Payer: BC Managed Care – PPO | Admitting: Internal Medicine

## 2018-09-29 DIAGNOSIS — R001 Bradycardia, unspecified: Secondary | ICD-10-CM

## 2018-09-29 NOTE — Progress Notes (Signed)
Patient did not log on for virtual visit.  Patient called and VM left.  Will need to reschedule office visit.

## 2018-10-15 ENCOUNTER — Ambulatory Visit: Payer: Self-pay | Admitting: Surgery

## 2018-10-29 ENCOUNTER — Ambulatory Visit: Payer: Self-pay | Admitting: Surgery

## 2018-10-29 NOTE — Patient Instructions (Addendum)
DUE TO COVID-19 ONLY ONE VISITOR IS ALLOWED TO COME WITH YOU AND STAY IN THE WAITING ROOM ONLY DURING PRE OP AND PROCEDURE DAY OF SURGERY. THE 1 VISITOR MAY VISIT WITH YOU AFTER SURGERY IN YOUR PRIVATE ROOM DURING VISITING HOURS ONLY!  YOU NEED TO HAVE A COVID 19 TEST ON___saturday 10-30-2018., THIS TEST MUST BE DONE BEFORE SURGERY, COME  [address], [place] , [address].  ([facility]) ONCE YOUR COVID TEST IS COMPLETED, PLEASE BEGIN THE QUARANTINE INSTRUCTIONS AS OUTLINED IN YOUR HANDOUT.                Travis Kaufman    Your procedure is scheduled on: 11-03-2018   Report to [facility] Main  Entrance   Report to admitting at 6:30AM     Call this number if you have problems the morning of surgery [place], NO CHEWING GUM [place].   PLEASE FOLLOW ALL BOWEL PREP INSTRUCTIONS PROVIDED BY YOUR SURGEON IF ANY!   DRINK 2 PRESURGERY ENSURE DRINKS THE NIGHT BEFORE SURGERY AT 10:00 PM. NO SOLIDS AFTER MIDNIGHT THE NIGHT PRIOR TO THE SURGERY.  YOU MAY DRINK CLEAR LIQUIDS UNTIL __5:30am_____ THE NEXT MORNING (SEE DIET BELOW). AT ___5:30am_____ THE DAY OF SURGERY, FINISH THE LAST PRE-SURGERY DRINK. NOTHING BY MOUTH AFTER THE LAST ENSURE DRINK!        CLEAR LIQUID DIET   Foods Allowed                                                                     Foods Excluded  Coffee and tea, regular and decaf                             liquids that you cannot  Plain Jell-O any favor except red or purple                                           see through such as: Fruit ices (not with fruit pulp)                                     milk, soups, orange juice  Iced Popsicles                                    All solid food Carbonated beverages, regular and diet                                    Cranberry, grape and apple juices Sports drinks like Gatorade Lightly seasoned clear broth or  consume(fat free) Sugar, honey syrup  Sample Menu Breakfast  Lunch                                     Supper Cranberry juice                    Beef broth                            Chicken broth Jell-O                                     Grape juice                           Apple juice Coffee or tea                        Jell-O                                      Popsicle                                                Coffee or tea                        Coffee or tea  _____________________________________________________________________    Take these medicines the morning of surgery with A SIP OF WATER: Dexilant                             You may not have any metal on your body including hair pins and              piercings  Do not wear jewelry, make-up, lotions, powders or perfumes, deodorant             Do not wear nail polish.  Do not shave  48 hours prior to surgery.              Men may shave face and neck.   Do not bring valuables to the hospital. [place].  Contacts, dentures or bridgework may not be worn into surgery.  Leave suitcase in the car. After surgery it may be brought to your room.     Patients discharged the day of surgery will not be allowed to drive home. IF YOU ARE HAVING SURGERY AND GOING HOME THE SAME DAY, YOU MUST HAVE AN ADULT TO DRIVE YOU HOME AND BE WITH YOU FOR 24 HOURS. YOU MAY GO HOME BY TAXI OR UBER OR ORTHERWISE, BUT AN ADULT MUST ACCOMPANY YOU HOME AND STAY WITH YOU FOR 24 HOURS.  Name and phone number of your driver:  Special Instructions: N/A              Please read over the following fact sheets you were given: _____________________________________________________________________            Travis Kaufman  Health - Preparing for Surgery Before surgery, you can play an important role.  Because skin is not sterile, your skin needs to be as free of germs as possible.  You  can reduce the number of germs on your skin by washing with CHG (chlorahexidine gluconate) soap before surgery.  CHG is an antiseptic cleaner which kills germs and bonds with the skin to continue killing germs even after washing. Please DO NOT use if you have an allergy to CHG or antibacterial soaps.  If your skin becomes reddened/irritated stop using the CHG and inform your nurse when you arrive at Short Stay. Do not shave (including legs and underarms) for at least 48 hours prior to the first CHG shower.  You may shave your face/neck. Please follow these instructions carefully:  1.  Shower with CHG Soap the night before surgery and the  morning of Surgery.  2.  If you choose to wash your hair, wash your hair first as usual with your  normal  shampoo.  3.  After you shampoo, rinse your hair and body thoroughly to remove the  shampoo.                           4.  Use CHG as you would any other liquid soap.  You can apply chg directly  to the skin and wash                       Gently with a scrungie or clean washcloth.  5.  Apply the CHG Soap to your body ONLY FROM THE NECK DOWN.   Do not use on face/ open                           Wound or open sores. Avoid contact with eyes, ears mouth and genitals (private parts).                       Wash face,  Genitals (private parts) with your normal soap.             6.  Wash thoroughly, paying special attention to the area where your surgery  will be performed.  7.  Thoroughly rinse your body with warm water from the neck down.  8.  DO NOT shower/wash with your normal soap after using and rinsing off  the CHG Soap.                9.  Pat yourself dry with a clean towel.            10.  Wear clean pajamas.            11.  Place clean sheets on your bed the night of your first shower and do not  sleep with pets. Day of Surgery : Do not apply any lotions/deodorants the morning of surgery.  Please wear clean clothes to the hospital/surgery center.  FAILURE  TO FOLLOW THESE INSTRUCTIONS MAY RESULT IN THE CANCELLATION OF YOUR SURGERY PATIENT SIGNATURE_________________________________  NURSE SIGNATURE__________________________________  ________________________________________________________________________

## 2018-10-30 ENCOUNTER — Other Ambulatory Visit (HOSPITAL_COMMUNITY)
Admission: RE | Admit: 2018-10-30 | Discharge: 2018-10-30 | Disposition: A | Discharge status: Home / Self Care | Payer: BC Managed Care – PPO | Source: Ambulatory Visit | Attending: Surgery | Admitting: Surgery

## 2018-10-30 DIAGNOSIS — Z20828 Contact with and (suspected) exposure to other viral communicable diseases: Secondary | ICD-10-CM | POA: Diagnosis not present

## 2018-10-30 DIAGNOSIS — Z01812 Encounter for preprocedural laboratory examination: Secondary | ICD-10-CM | POA: Diagnosis not present

## 2018-10-30 LAB — SARS CORONAVIRUS 2 (TAT 6-24 HRS): SARS Coronavirus 2: NEGATIVE

## 2018-11-01 ENCOUNTER — Encounter (HOSPITAL_COMMUNITY)
Admission: RE | Admit: 2018-11-01 | Discharge: 2018-11-01 | Disposition: A | Discharge status: Home / Self Care | Payer: BC Managed Care – PPO | Source: Ambulatory Visit | Attending: Surgery | Admitting: Surgery

## 2018-11-01 ENCOUNTER — Other Ambulatory Visit: Payer: Self-pay

## 2018-11-01 ENCOUNTER — Encounter (HOSPITAL_COMMUNITY): Payer: Self-pay

## 2018-11-01 DIAGNOSIS — K219 Gastro-esophageal reflux disease without esophagitis: Secondary | ICD-10-CM | POA: Insufficient documentation

## 2018-11-01 DIAGNOSIS — Z01812 Encounter for preprocedural laboratory examination: Secondary | ICD-10-CM | POA: Insufficient documentation

## 2018-11-01 LAB — CBC
HCT: 46.4 % (ref 39.0–52.0)
Hemoglobin: 15.6 g/dL (ref 13.0–17.0)
MCH: 31.8 pg (ref 26.0–34.0)
MCHC: 33.6 g/dL (ref 30.0–36.0)
MCV: 94.5 fL (ref 80.0–100.0)
Platelets: 282 10*3/uL (ref 150–400)
RBC: 4.91 MIL/uL (ref 4.22–5.81)
RDW: 13.7 % (ref 11.5–15.5)
WBC: 11.2 10*3/uL — ABNORMAL HIGH (ref 4.0–10.5)
nRBC: 0 % (ref 0.0–0.2)

## 2018-11-01 MED ORDER — ENSURE PRE-SURGERY PO LIQD
592.0000 mL | Freq: Once | ORAL | Status: DC
  Filled 2018-11-01: qty 592

## 2018-11-01 MED ORDER — ENSURE PRE-SURGERY PO LIQD
296.0000 mL | Freq: Once | ORAL | Status: DC
  Filled 2018-11-01: qty 296

## 2018-11-01 NOTE — Progress Notes (Signed)
PCP - Luetta Nutting DO Cardiologist - Dr. Rayann Heman 09/29/2018 Travis Kaufman did not log in to virtual follow up appointment  Chest x-ray - 08/16/2018 in epic EKG - 08/16/2018 per note6/ 08/17/2018 in epic low heart believed to be a vagal response after COVID swab testing Stress Test - N/A ECHO - 08/15/2018 in epic Cardiac Cath - 08/17/2018 in epic  Sleep Study - N/A CPAP - N/A  Fasting Blood Sugar -N/A  Checks Blood Sugar _____ times a day  Blood Thinner Instructions:N/A Aspirin Instructions:N/A Last Dose:N/A  Anesthesia review: Recent cardiac event 08/2018  Patient denies shortness of breath, fever, cough and chest pain at PAT appointment   Patient verbalized understanding of instructions that were given to them at the PAT appointment. Patient was also instructed that they will need to review over the PAT instructions again at home before surgery.

## 2018-11-01 NOTE — Progress Notes (Signed)
SPOKE W/  Travis Kaufman     SCREENING SYMPTOMS OF COVID 19:   COUGH--NO  RUNNY NOSE--- NO  SORE THROAT---NO  NASAL CONGESTION----NO  SNEEZING----NO  SHORTNESS OF BREATH---NO  DIFFICULTY BREATHING---NO  TEMP >100.0 -----NO  UNEXPLAINED BODY ACHES------NO  CHILLS -------- NO  HEADACHES ---------NO  LOSS OF SMELL/ TASTE --------NO    HAVE YOU OR ANY FAMILY MEMBER TRAVELLED PAST 14 DAYS OUT OF THE   COUNTY---NO STATE----NO COUNTRY----NO  HAVE YOU OR ANY FAMILY MEMBER BEEN EXPOSED TO ANYONE WITH COVID 19? NO

## 2018-11-02 DIAGNOSIS — K219 Gastro-esophageal reflux disease without esophagitis: Secondary | ICD-10-CM | POA: Diagnosis not present

## 2018-11-02 DIAGNOSIS — Z01812 Encounter for preprocedural laboratory examination: Secondary | ICD-10-CM | POA: Diagnosis not present

## 2018-11-02 NOTE — Anesthesia Preprocedure Evaluation (Addendum)
Anesthesia Evaluation  Patient identified by MRN, date of birth, ID band Patient awake    Reviewed: Allergy & Precautions, NPO status , Patient's Chart, lab work & pertinent test results  Airway Mallampati: II  TM Distance: >3 FB Neck ROM: Full    Dental no notable dental hx.    Pulmonary neg pulmonary ROS,    Pulmonary exam normal breath sounds clear to auscultation       Cardiovascular negative cardio ROS Normal cardiovascular exam Rhythm:Regular Rate:Normal  ECG: SB, rate 38  ECHO: 1. The left ventricle has hyperdynamic systolic function, with an ejection fraction of >65%. The cavity size was normal. Left ventricular diastolic parameters were normal. No evidence of left ventricular regional wall motion abnormalities. 2. The right ventricle has normal systolic function. The cavity was normal. There is no increase in right ventricular wall thickness. Right ventricular systolic pressure could not be assessed. 3. The aortic valve is grossly normal.  Cardiac Cath 08/17/2018 The left ventricular systolic function is normal. LV end diastolic pressure is normal. The left ventricular ejection fraction is greater than 65% by visual estimate. There is no mitral valve regurgitation. 1. No angiographic evidence of CAD 2. Normal LV systolic function 3. Non-cardiac chest pain    Neuro/Psych negative neurological ROS  negative psych ROS   GI/Hepatic Neg liver ROS, GERD  Medicated,  Endo/Other  negative endocrine ROS  Renal/GU negative Renal ROS     Musculoskeletal negative musculoskeletal ROS (+)   Abdominal   Peds  Hematology HLD   Anesthesia Other Findings GERD REFRACTORY TO MEDICAL MANAGEMENT  Reproductive/Obstetrics                            Anesthesia Physical Anesthesia Plan  ASA: II  Anesthesia Plan: General   Post-op Pain Management:    Induction: Intravenous and Rapid  sequence  PONV Risk Score and Plan: 3 and Midazolam, Dexamethasone, Ondansetron and Treatment may vary due to age or medical condition  Airway Management Planned: Oral ETT  Additional Equipment:   Intra-op Plan:   Post-operative Plan: Extubation in OR  Informed Consent: I have reviewed the patients History and Physical, chart, labs and discussed the procedure including the risks, benefits and alternatives for the proposed anesthesia with the patient or authorized representative who has indicated his/her understanding and acceptance.     Dental advisory given  Plan Discussed with: CRNA  Anesthesia Plan Comments: (Reviewed PAT note 11/01/2018)      Anesthesia Quick Evaluation

## 2018-11-02 NOTE — Progress Notes (Signed)
Anesthesia Chart Review   Case: [id number] Date/Time: 11/03/18 0800   Procedure: XI ROBOTIC ASSISTED NISSEN FUNDOPLICATION, POSSIBLE HIATAL HERNIA REPAIR (N/A )   Anesthesia type: General   Pre-op diagnosis: GERD REFRACTORY TO MEDICAL MANAGEMENT   Location: [facility] ROOM 02 / [facility]   Surgeon: Michael Boston, MD      DISCUSSION:48 y.o. never smoker with h/o GERD refractory to medical management scheduled for above procedure 11/03/2018 with Dr. Michael Boston.   Recent admission 6/14-6/16/2020 due to chest pain, cardiac cath with no evidence of CAD, cardiac etiology ruled out. EGD performed, fundoplication recommended if he remained symptomatic. No cardiac history prior to this. Pt experienced episode of bradycardia with COVID swab during admission thought to be a vasovagal response.  He experienced subsequent nocturnal bradycardia.  TSH normal, no rate control medications started.  Heart rate monitor ordered, unclear if patient has used it.  He did not log in for virtual follow up visit with cardiologist.  HR at PAT visit 60 bpm.   VS: BP 124/70 (BP Location: Left Arm)   Pulse 60   Temp 37 C (Oral)   Resp 18   Ht 5\' 11"  (1.803 m)   Wt 83.9 kg   SpO2 99%   BMI 25.80 kg/m   PROVIDERS: Luetta Nutting, DO is PCP   Fransico Him, MD is Cardiologist  LABS: Labs reviewed: Acceptable for surgery. (all labs ordered are listed, but only abnormal results are displayed)  Labs Reviewed  CBC - Abnormal; Notable for the following components:      Result Value   WBC 11.2 (*)    All other components within normal limits     IMAGES: CT Cardiac 08/16/2018 IMPRESSION: Coronary calcium score of 4.7. This was 80th percentile for age and sex matched control.  EKG: 08/16/2018 Rate 38 bpm (thought to have vasovagal response to COVID swab, pulse 60 at PAT visit) Sinus bradycardia  Minimal ST depression, diffuse leads  CV: Cardiac Cath 08/17/2018  The left ventricular systolic function is normal.  LV  end diastolic pressure is normal.  The left ventricular ejection fraction is greater than 65% by visual estimate.  There is no mitral valve regurgitation.   1. No angiographic evidence of CAD 2. Normal LV systolic function 3. Non-cardiac chest pain  No further ischemic workup  Echo 08/15/2018 IMPRESSIONS   1. The left ventricle has hyperdynamic systolic function, with an ejection fraction of >65%. The cavity size was normal. Left ventricular diastolic parameters were normal. No evidence of left ventricular regional wall motion abnormalities.  2. The right ventricle has normal systolic function. The cavity was normal. There is no increase in right ventricular wall thickness. Right ventricular systolic pressure could not be assessed.  3. The aortic valve is grossly normal. Past Medical History:  Diagnosis Date  . GERD (gastroesophageal reflux disease)     Past Surgical History:  Procedure Laterality Date  . BIOPSY  08/17/2018   Procedure: BIOPSY;  Surgeon: Ronnette Juniper, MD;  Location: [facility] ENDOSCOPY;  Service: Gastroenterology;;  . ESOPHAGEAL MANOMETRY N/A 09/24/2018   Procedure: ESOPHAGEAL MANOMETRY (EM);  Surgeon: Ronnette Juniper, MD;  Location: [facility] ENDOSCOPY;  Service: Gastroenterology;  Laterality: N/A;  . ESOPHAGOGASTRODUODENOSCOPY (EGD) WITH PROPOFOL N/A 08/17/2018   Procedure: ESOPHAGOGASTRODUODENOSCOPY (EGD) WITH PROPOFOL;  Surgeon: Ronnette Juniper, MD;  Location: [facility];  Service: Gastroenterology;  Laterality: N/A;  . LEFT HEART CATH AND CORONARY ANGIOGRAPHY N/A 08/17/2018   Procedure: LEFT HEART CATH AND CORONARY ANGIOGRAPHY;  Surgeon: Burnell Blanks,  MD;  Location: [facility];  Service: Cardiovascular;  Laterality: N/A;    MEDICATIONS: . atorvastatin (LIPITOR) 40 MG tablet  . dexlansoprazole (DEXILANT) 60 MG capsule  . EPINEPHrine 0.3 mg/0.3 mL IJ SOAJ injection  . Nutritional Supplements (JUICE PLUS FIBRE PO)   No current facility-administered medications  for this encounter.    Marland Kitchen 0.9 %  sodium chloride infusion     Maia Plan [facility] Pre-Surgical Testing [phone] 11/02/18 11:14 AM

## 2018-11-03 ENCOUNTER — Observation Stay (HOSPITAL_COMMUNITY)
Admission: RE | Admit: 2018-11-03 | Discharge: 2018-11-05 | Disposition: A | Discharge status: Home / Self Care | Payer: BC Managed Care – PPO | Attending: Surgery | Admitting: Surgery

## 2018-11-03 ENCOUNTER — Encounter (HOSPITAL_COMMUNITY)
Admission: RE | Disposition: A | Discharge status: Home / Self Care | Payer: Self-pay | Source: Home / Self Care | Attending: Surgery

## 2018-11-03 ENCOUNTER — Ambulatory Visit (HOSPITAL_COMMUNITY): Payer: BC Managed Care – PPO | Admitting: Certified Registered Nurse Anesthetist

## 2018-11-03 ENCOUNTER — Other Ambulatory Visit: Payer: Self-pay

## 2018-11-03 ENCOUNTER — Encounter (HOSPITAL_COMMUNITY): Payer: Self-pay | Admitting: General Practice

## 2018-11-03 ENCOUNTER — Ambulatory Visit (HOSPITAL_COMMUNITY): Payer: BC Managed Care – PPO | Admitting: Physician Assistant

## 2018-11-03 DIAGNOSIS — Z885 Allergy status to narcotic agent status: Secondary | ICD-10-CM | POA: Diagnosis not present

## 2018-11-03 DIAGNOSIS — K2100 Gastro-esophageal reflux disease with esophagitis, without bleeding: Secondary | ICD-10-CM | POA: Diagnosis present

## 2018-11-03 DIAGNOSIS — Z79899 Other long term (current) drug therapy: Secondary | ICD-10-CM | POA: Insufficient documentation

## 2018-11-03 DIAGNOSIS — E785 Hyperlipidemia, unspecified: Secondary | ICD-10-CM | POA: Diagnosis not present

## 2018-11-03 DIAGNOSIS — K219 Gastro-esophageal reflux disease without esophagitis: Secondary | ICD-10-CM | POA: Diagnosis not present

## 2018-11-03 DIAGNOSIS — Z9889 Other specified postprocedural states: Secondary | ICD-10-CM

## 2018-11-03 DIAGNOSIS — I209 Angina pectoris, unspecified: Secondary | ICD-10-CM | POA: Diagnosis not present

## 2018-11-03 DIAGNOSIS — N179 Acute kidney failure, unspecified: Secondary | ICD-10-CM | POA: Diagnosis not present

## 2018-11-03 SURGERY — FUNDOPLICATION, NISSEN, ROBOT-ASSISTED, LAPAROSCOPIC
Anesthesia: General | Site: Abdomen

## 2018-11-03 MED ORDER — SODIUM CHLORIDE 0.9 % IV SOLN
INTRAVENOUS | Status: DC | PRN
  Administered 2018-11-03: 25 ug/min via INTRAVENOUS

## 2018-11-03 MED ORDER — METRONIDAZOLE IN NACL 5-0.79 MG/ML-% IV SOLN
500.0000 mg | INTRAVENOUS | Status: AC
  Administered 2018-11-03: 500 mg via INTRAVENOUS
  Filled 2018-11-03: qty 100

## 2018-11-03 MED ORDER — LIP MEDEX EX OINT
TOPICAL_OINTMENT | CUTANEOUS | Status: AC
  Filled 2018-11-03: qty 7

## 2018-11-03 MED ORDER — FENTANYL CITRATE (PF) 250 MCG/5ML IJ SOLN
INTRAMUSCULAR | Status: AC
  Filled 2018-11-03: qty 5

## 2018-11-03 MED ORDER — EPINEPHRINE 0.3 MG/0.3ML IJ SOAJ: 0.3000 mg | INTRAMUSCULAR | Status: DC | PRN

## 2018-11-03 MED ORDER — ONDANSETRON HCL 4 MG PO TABS: 4.0000 mg | ORAL_TABLET | Freq: Three times a day (TID) | ORAL | 5 refills | Status: DC | PRN

## 2018-11-03 MED ORDER — POLYETHYLENE GLYCOL 3350 17 G PO PACK: 17.0000 g | PACK | Freq: Every day | ORAL | Status: DC | PRN

## 2018-11-03 MED ORDER — ONDANSETRON HCL 4 MG/2ML IJ SOLN
INTRAMUSCULAR | Status: AC
  Filled 2018-11-03: qty 4

## 2018-11-03 MED ORDER — SUCCINYLCHOLINE CHLORIDE 200 MG/10ML IV SOSY
PREFILLED_SYRINGE | INTRAVENOUS | Status: AC
  Filled 2018-11-03: qty 10

## 2018-11-03 MED ORDER — ONDANSETRON HCL 4 MG/2ML IJ SOLN: 4.0000 mg | Freq: Four times a day (QID) | INTRAMUSCULAR | Status: DC | PRN

## 2018-11-03 MED ORDER — DEXAMETHASONE SODIUM PHOSPHATE 4 MG/ML IJ SOLN: 4.0000 mg | INTRAMUSCULAR | Status: DC

## 2018-11-03 MED ORDER — FENTANYL CITRATE (PF) 250 MCG/5ML IJ SOLN
INTRAMUSCULAR | Status: DC | PRN
  Administered 2018-11-03 (×5): 50 ug via INTRAVENOUS

## 2018-11-03 MED ORDER — ROCURONIUM BROMIDE 10 MG/ML (PF) SYRINGE
PREFILLED_SYRINGE | INTRAVENOUS | Status: AC
  Filled 2018-11-03: qty 20

## 2018-11-03 MED ORDER — PROPOFOL 10 MG/ML IV BOLUS
INTRAVENOUS | Status: AC
  Filled 2018-11-03: qty 20

## 2018-11-03 MED ORDER — CELECOXIB 200 MG PO CAPS
200.0000 mg | ORAL_CAPSULE | ORAL | Status: AC
  Administered 2018-11-03: 200 mg via ORAL
  Filled 2018-11-03: qty 1

## 2018-11-03 MED ORDER — LACTATED RINGERS IV SOLN
INTRAVENOUS | Status: DC
  Administered 2018-11-03 (×2): via INTRAVENOUS

## 2018-11-03 MED ORDER — PROMETHAZINE HCL 25 MG/ML IJ SOLN
6.2500 mg | INTRAMUSCULAR | Status: DC | PRN
  Administered 2018-11-03: 6.25 mg via INTRAVENOUS

## 2018-11-03 MED ORDER — PROCHLORPERAZINE EDISYLATE 10 MG/2ML IJ SOLN: 5.0000 mg | Freq: Four times a day (QID) | INTRAMUSCULAR | Status: DC | PRN

## 2018-11-03 MED ORDER — SCOPOLAMINE 1 MG/3DAYS TD PT72
MEDICATED_PATCH | TRANSDERMAL | Status: AC
  Filled 2018-11-03: qty 1

## 2018-11-03 MED ORDER — KETAMINE HCL 10 MG/ML IJ SOLN
INTRAMUSCULAR | Status: DC | PRN
  Administered 2018-11-03: 10 mg via INTRAVENOUS
  Administered 2018-11-03: 20 mg via INTRAVENOUS

## 2018-11-03 MED ORDER — DEXAMETHASONE SODIUM PHOSPHATE 10 MG/ML IJ SOLN
INTRAMUSCULAR | Status: DC | PRN
  Administered 2018-11-03: 4 mg via INTRAVENOUS

## 2018-11-03 MED ORDER — BUPIVACAINE LIPOSOME 1.3 % IJ SUSP
20.0000 mL | Freq: Once | INTRAMUSCULAR | Status: AC
  Administered 2018-11-03: 09:00:00 20 mL
  Filled 2018-11-03: qty 20

## 2018-11-03 MED ORDER — PHENYLEPHRINE HCL (PRESSORS) 10 MG/ML IV SOLN
INTRAVENOUS | Status: AC
  Filled 2018-11-03: qty 2

## 2018-11-03 MED ORDER — DEXAMETHASONE SODIUM PHOSPHATE 10 MG/ML IJ SOLN
INTRAMUSCULAR | Status: AC
  Filled 2018-11-03: qty 2

## 2018-11-03 MED ORDER — MAGIC MOUTHWASH
15.0000 mL | Freq: Four times a day (QID) | ORAL | Status: DC | PRN
  Filled 2018-11-03: qty 15

## 2018-11-03 MED ORDER — HYDROMORPHONE HCL 1 MG/ML IJ SOLN
0.5000 mg | INTRAMUSCULAR | Status: DC | PRN
  Administered 2018-11-03: 2 mg via INTRAVENOUS
  Administered 2018-11-03 – 2018-11-05 (×5): 1 mg via INTRAVENOUS
  Filled 2018-11-03: qty 2
  Filled 2018-11-03 (×5): qty 1

## 2018-11-03 MED ORDER — OXYCODONE HCL 5 MG PO TABS: 5.0000 mg | ORAL_TABLET | Freq: Four times a day (QID) | ORAL | 0 refills | Status: DC | PRN

## 2018-11-03 MED ORDER — MIDAZOLAM HCL 2 MG/2ML IJ SOLN
INTRAMUSCULAR | Status: DC | PRN
  Administered 2018-11-03 (×2): 1 mg via INTRAVENOUS

## 2018-11-03 MED ORDER — EPHEDRINE SULFATE-NACL 50-0.9 MG/10ML-% IV SOSY
PREFILLED_SYRINGE | INTRAVENOUS | Status: DC | PRN
  Administered 2018-11-03 (×3): 10 mg via INTRAVENOUS
  Administered 2018-11-03: 5 mg via INTRAVENOUS

## 2018-11-03 MED ORDER — LACTATED RINGERS IR SOLN
Status: DC | PRN
  Administered 2018-11-03: 1000 mL

## 2018-11-03 MED ORDER — BISACODYL 10 MG RE SUPP: 10.0000 mg | Freq: Every day | RECTAL | Status: DC | PRN

## 2018-11-03 MED ORDER — PHENYLEPHRINE 40 MCG/ML (10ML) SYRINGE FOR IV PUSH (FOR BLOOD PRESSURE SUPPORT)
PREFILLED_SYRINGE | INTRAVENOUS | Status: AC
  Filled 2018-11-03: qty 10

## 2018-11-03 MED ORDER — BUPIVACAINE HCL (PF) 0.25 % IJ SOLN
INTRAMUSCULAR | Status: DC | PRN
  Administered 2018-11-03: 40 mL

## 2018-11-03 MED ORDER — SODIUM CHLORIDE 0.9 % IV SOLN
INTRAVENOUS | Status: DC
  Administered 2018-11-03: 14:00:00 via INTRAVENOUS

## 2018-11-03 MED ORDER — ALBUMIN HUMAN 5 % IV SOLN
INTRAVENOUS | Status: AC
  Filled 2018-11-03: qty 500

## 2018-11-03 MED ORDER — DIPHENHYDRAMINE HCL 12.5 MG/5ML PO ELIX: 12.5000 mg | ORAL_SOLUTION | Freq: Four times a day (QID) | ORAL | Status: DC | PRN

## 2018-11-03 MED ORDER — HYDROMORPHONE HCL 1 MG/ML IJ SOLN
0.2500 mg | INTRAMUSCULAR | Status: DC | PRN
  Administered 2018-11-03 (×4): 0.5 mg via INTRAVENOUS

## 2018-11-03 MED ORDER — KETAMINE HCL 10 MG/ML IJ SOLN
INTRAMUSCULAR | Status: AC
  Filled 2018-11-03: qty 1

## 2018-11-03 MED ORDER — BUPIVACAINE HCL (PF) 0.25 % IJ SOLN
INTRAMUSCULAR | Status: AC
  Filled 2018-11-03: qty 60

## 2018-11-03 MED ORDER — ENOXAPARIN SODIUM 40 MG/0.4ML ~~LOC~~ SOLN
40.0000 mg | SUBCUTANEOUS | Status: DC
  Administered 2018-11-04 – 2018-11-05 (×2): 40 mg via SUBCUTANEOUS
  Filled 2018-11-03 (×2): qty 0.4

## 2018-11-03 MED ORDER — GABAPENTIN 300 MG PO CAPS
300.0000 mg | ORAL_CAPSULE | ORAL | Status: AC
  Administered 2018-11-03: 300 mg via ORAL
  Filled 2018-11-03: qty 1

## 2018-11-03 MED ORDER — ROCURONIUM BROMIDE 10 MG/ML (PF) SYRINGE
PREFILLED_SYRINGE | INTRAVENOUS | Status: DC | PRN
  Administered 2018-11-03: 10 mg via INTRAVENOUS
  Administered 2018-11-03: 20 mg via INTRAVENOUS
  Administered 2018-11-03: 50 mg via INTRAVENOUS

## 2018-11-03 MED ORDER — HYDROMORPHONE HCL 1 MG/ML IJ SOLN
INTRAMUSCULAR | Status: AC
  Filled 2018-11-03: qty 2

## 2018-11-03 MED ORDER — LACTATED RINGERS IV SOLN: 1000.0000 mL | Freq: Three times a day (TID) | INTRAVENOUS | Status: DC | PRN

## 2018-11-03 MED ORDER — SCOPOLAMINE 1 MG/3DAYS TD PT72
MEDICATED_PATCH | TRANSDERMAL | Status: DC | PRN
  Administered 2018-11-03: 1 via TRANSDERMAL

## 2018-11-03 MED ORDER — DEXAMETHASONE SODIUM PHOSPHATE 10 MG/ML IJ SOLN
4.0000 mg | Freq: Two times a day (BID) | INTRAMUSCULAR | Status: DC
  Administered 2018-11-03 – 2018-11-04 (×4): 4 mg via INTRAVENOUS
  Filled 2018-11-03 (×4): qty 1

## 2018-11-03 MED ORDER — OXYCODONE HCL 5 MG PO TABS
5.0000 mg | ORAL_TABLET | ORAL | Status: DC | PRN
  Administered 2018-11-05: 02:00:00 10 mg via ORAL
  Filled 2018-11-03: qty 2

## 2018-11-03 MED ORDER — ONDANSETRON HCL 4 MG/2ML IJ SOLN
INTRAMUSCULAR | Status: DC | PRN
  Administered 2018-11-03: 4 mg via INTRAVENOUS

## 2018-11-03 MED ORDER — GABAPENTIN 250 MG/5ML PO SOLN
300.0000 mg | Freq: Two times a day (BID) | ORAL | Status: DC
  Administered 2018-11-03 – 2018-11-05 (×4): 300 mg via ORAL
  Filled 2018-11-03 (×4): qty 6

## 2018-11-03 MED ORDER — SODIUM CHLORIDE 0.9 % IV SOLN
2.0000 g | INTRAVENOUS | Status: AC
  Administered 2018-11-03: 2 g via INTRAVENOUS
  Filled 2018-11-03: qty 20

## 2018-11-03 MED ORDER — GABAPENTIN 300 MG PO CAPS
300.0000 mg | ORAL_CAPSULE | Freq: Two times a day (BID) | ORAL | Status: DC
  Administered 2018-11-03: 300 mg via ORAL
  Filled 2018-11-03: qty 1

## 2018-11-03 MED ORDER — PANTOPRAZOLE SODIUM 40 MG PO PACK
40.0000 mg | PACK | Freq: Every day | ORAL | Status: DC
  Administered 2018-11-03 – 2018-11-05 (×3): 40 mg via ORAL
  Filled 2018-11-03 (×4): qty 20

## 2018-11-03 MED ORDER — PROMETHAZINE HCL 25 MG RE SUPP: 25.0000 mg | Freq: Four times a day (QID) | RECTAL | 5 refills | Status: DC | PRN

## 2018-11-03 MED ORDER — OXYCODONE HCL 5 MG/5ML PO SOLN: 5.0000 mg | Freq: Once | ORAL | Status: DC | PRN

## 2018-11-03 MED ORDER — ALBUTEROL SULFATE HFA 108 (90 BASE) MCG/ACT IN AERS
INHALATION_SPRAY | RESPIRATORY_TRACT | Status: AC
  Filled 2018-11-03: qty 6.7

## 2018-11-03 MED ORDER — HYDRALAZINE HCL 20 MG/ML IJ SOLN: 5.0000 mg | INTRAMUSCULAR | Status: DC | PRN

## 2018-11-03 MED ORDER — PROPOFOL 10 MG/ML IV BOLUS
INTRAVENOUS | Status: DC | PRN
  Administered 2018-11-03: 160 mg via INTRAVENOUS
  Administered 2018-11-03 (×2): 20 mg via INTRAVENOUS

## 2018-11-03 MED ORDER — LIDOCAINE 2% (20 MG/ML) 5 ML SYRINGE
INTRAMUSCULAR | Status: DC | PRN
  Administered 2018-11-03: 60 mg via INTRAVENOUS

## 2018-11-03 MED ORDER — EPHEDRINE 5 MG/ML INJ
INTRAVENOUS | Status: AC
  Filled 2018-11-03: qty 10

## 2018-11-03 MED ORDER — METHOCARBAMOL 500 MG IVPB - SIMPLE MED
INTRAVENOUS | Status: AC
  Filled 2018-11-03: qty 50

## 2018-11-03 MED ORDER — LACTATED RINGERS IV BOLUS: 1000.0000 mL | Freq: Three times a day (TID) | INTRAVENOUS | Status: DC | PRN

## 2018-11-03 MED ORDER — OMEPRAZOLE 2 MG/ML ORAL SUSPENSION: 20.0000 mg | Freq: Every day | ORAL | Status: DC

## 2018-11-03 MED ORDER — SIMETHICONE 80 MG PO CHEW: 40.0000 mg | CHEWABLE_TABLET | Freq: Four times a day (QID) | ORAL | Status: DC | PRN

## 2018-11-03 MED ORDER — ACETAMINOPHEN 500 MG PO TABS
1000.0000 mg | ORAL_TABLET | Freq: Three times a day (TID) | ORAL | Status: DC
  Administered 2018-11-03 – 2018-11-05 (×5): 1000 mg via ORAL
  Filled 2018-11-03 (×6): qty 2

## 2018-11-03 MED ORDER — LIP MEDEX EX OINT
1.0000 "application " | TOPICAL_OINTMENT | Freq: Two times a day (BID) | CUTANEOUS | Status: DC
  Administered 2018-11-03 – 2018-11-05 (×5): 1 via TOPICAL
  Filled 2018-11-03 (×2): qty 7

## 2018-11-03 MED ORDER — LIDOCAINE 20MG/ML (2%) 15 ML SYRINGE OPTIME
INTRAMUSCULAR | Status: DC | PRN
  Administered 2018-11-03: 1 mg/kg/h via INTRAVENOUS

## 2018-11-03 MED ORDER — MIDAZOLAM HCL 2 MG/2ML IJ SOLN
INTRAMUSCULAR | Status: AC
  Filled 2018-11-03: qty 2

## 2018-11-03 MED ORDER — METHOCARBAMOL 500 MG PO TABS
750.0000 mg | ORAL_TABLET | Freq: Four times a day (QID) | ORAL | Status: DC | PRN
  Administered 2018-11-04: 750 mg via ORAL
  Filled 2018-11-03: qty 2

## 2018-11-03 MED ORDER — METHOCARBAMOL 500 MG IVPB - SIMPLE MED
500.0000 mg | Freq: Once | INTRAVENOUS | Status: AC
  Administered 2018-11-03: 12:00:00 500 mg via INTRAVENOUS

## 2018-11-03 MED ORDER — OXYCODONE HCL 5 MG PO TABS: 5.0000 mg | ORAL_TABLET | Freq: Once | ORAL | Status: DC | PRN

## 2018-11-03 MED ORDER — DIPHENHYDRAMINE HCL 50 MG/ML IJ SOLN: 12.5000 mg | Freq: Four times a day (QID) | INTRAMUSCULAR | Status: DC | PRN

## 2018-11-03 MED ORDER — ONDANSETRON 4 MG PO TBDP: 4.0000 mg | ORAL_TABLET | Freq: Four times a day (QID) | ORAL | Status: DC | PRN

## 2018-11-03 MED ORDER — LIDOCAINE 2% (20 MG/ML) 5 ML SYRINGE
INTRAMUSCULAR | Status: AC
  Filled 2018-11-03: qty 10

## 2018-11-03 MED ORDER — ALBUTEROL SULFATE HFA 108 (90 BASE) MCG/ACT IN AERS
INHALATION_SPRAY | RESPIRATORY_TRACT | Status: DC | PRN
  Administered 2018-11-03: 4 via RESPIRATORY_TRACT

## 2018-11-03 MED ORDER — SUGAMMADEX SODIUM 200 MG/2ML IV SOLN
INTRAVENOUS | Status: DC | PRN
  Administered 2018-11-03: 200 mg via INTRAVENOUS

## 2018-11-03 MED ORDER — PROMETHAZINE HCL 25 MG/ML IJ SOLN
INTRAMUSCULAR | Status: AC
  Filled 2018-11-03: qty 1

## 2018-11-03 MED ORDER — METOCLOPRAMIDE HCL 5 MG/ML IJ SOLN
5.0000 mg | Freq: Three times a day (TID) | INTRAMUSCULAR | Status: DC | PRN
  Administered 2018-11-03: 10 mg via INTRAVENOUS
  Filled 2018-11-03: qty 2

## 2018-11-03 MED ORDER — PROCHLORPERAZINE MALEATE 10 MG PO TABS
10.0000 mg | ORAL_TABLET | Freq: Four times a day (QID) | ORAL | Status: DC | PRN
  Filled 2018-11-03: qty 1

## 2018-11-03 MED ORDER — ACETAMINOPHEN 500 MG PO TABS
1000.0000 mg | ORAL_TABLET | ORAL | Status: AC
  Administered 2018-11-03: 08:00:00 1000 mg via ORAL
  Filled 2018-11-03: qty 2

## 2018-11-03 MED ORDER — SUGAMMADEX SODIUM 500 MG/5ML IV SOLN
INTRAVENOUS | Status: AC
  Filled 2018-11-03: qty 5

## 2018-11-03 MED ORDER — 0.9 % SODIUM CHLORIDE (POUR BTL) OPTIME
TOPICAL | Status: DC | PRN
  Administered 2018-11-03: 1000 mL

## 2018-11-03 MED ORDER — METOPROLOL TARTRATE 5 MG/5ML IV SOLN: 5.0000 mg | Freq: Four times a day (QID) | INTRAVENOUS | Status: DC | PRN

## 2018-11-03 MED ORDER — SUCCINYLCHOLINE CHLORIDE 200 MG/10ML IV SOSY
PREFILLED_SYRINGE | INTRAVENOUS | Status: DC | PRN
  Administered 2018-11-03: 80 mg via INTRAVENOUS

## 2018-11-03 SURGICAL SUPPLY — 68 items
APL PRP STRL LF DISP 70% ISPRP (MISCELLANEOUS) ×1
APPLIER CLIP 5 13 M/L LIGAMAX5 (MISCELLANEOUS)
APR CLP MED LRG 5 ANG JAW (MISCELLANEOUS)
BLADE SURG SZ11 CARB STEEL (BLADE) ×3 IMPLANT
CHLORAPREP W/TINT 26 (MISCELLANEOUS) ×3 IMPLANT
CLIP APPLIE 5 13 M/L LIGAMAX5 (MISCELLANEOUS) IMPLANT
COVER SURGICAL LIGHT HANDLE (MISCELLANEOUS) ×3 IMPLANT
COVER TIP SHEARS 8 DVNC (MISCELLANEOUS) IMPLANT
COVER TIP SHEARS 8MM DA VINCI (MISCELLANEOUS) ×2
COVER WAND RF STERILE (DRAPES) ×3 IMPLANT
DECANTER SPIKE VIAL GLASS SM (MISCELLANEOUS) ×5 IMPLANT
DRAIN CHANNEL 19F RND (DRAIN) IMPLANT
DRAIN PENROSE 18X1/2 LTX STRL (DRAIN) IMPLANT
DRAPE ARM DVNC X/XI (DISPOSABLE) ×4 IMPLANT
DRAPE COLUMN DVNC XI (DISPOSABLE) ×1 IMPLANT
DRAPE DA VINCI XI ARM (DISPOSABLE) ×8
DRAPE DA VINCI XI COLUMN (DISPOSABLE) ×2
DRAPE WARM FLUID 44X44 (DRAPES) ×3 IMPLANT
DRSG TEGADERM 2-3/8X2-3/4 SM (GAUZE/BANDAGES/DRESSINGS) ×17 IMPLANT
ELECT REM PT RETURN 15FT ADLT (MISCELLANEOUS) ×3 IMPLANT
ENDOLOOP SUT PDS II  0 18 (SUTURE)
ENDOLOOP SUT PDS II 0 18 (SUTURE) IMPLANT
EVACUATOR DRAINAGE 10X20 100CC (DRAIN) IMPLANT
EVACUATOR SILICONE 100CC (DRAIN) IMPLANT
FELT TEFLON 4 X1 (Mesh General) ×3 IMPLANT
GAUZE SPONGE 2X2 8PLY STRL LF (GAUZE/BANDAGES/DRESSINGS) ×1 IMPLANT
GLOVE BIO SURGEON STRL SZ 6.5 (GLOVE) ×2 IMPLANT
GLOVE BIO SURGEONS STRL SZ 6.5 (GLOVE) ×2
GLOVE BIOGEL PI IND STRL 6.5 (GLOVE) IMPLANT
GLOVE BIOGEL PI IND STRL 7.0 (GLOVE) IMPLANT
GLOVE BIOGEL PI INDICATOR 6.5 (GLOVE) ×2
GLOVE BIOGEL PI INDICATOR 7.0 (GLOVE) ×2
GLOVE ECLIPSE 8.0 STRL XLNG CF (GLOVE) ×6 IMPLANT
GLOVE INDICATOR 8.0 STRL GRN (GLOVE) ×6 IMPLANT
GLOVE SURG SS PI 7.0 STRL IVOR (GLOVE) ×2 IMPLANT
GOWN STRL REUS W/ TWL LRG LVL3 (GOWN DISPOSABLE) IMPLANT
GOWN STRL REUS W/TWL LRG LVL3 (GOWN DISPOSABLE) ×3
GOWN STRL REUS W/TWL XL LVL3 (GOWN DISPOSABLE) ×7 IMPLANT
GRASPER SUT TROCAR 14GX15 (MISCELLANEOUS) ×2 IMPLANT
IRRIG SUCT STRYKERFLOW 2 WTIP (MISCELLANEOUS) ×3
IRRIGATION SUCT STRKRFLW 2 WTP (MISCELLANEOUS) ×1 IMPLANT
KIT BASIN OR (CUSTOM PROCEDURE TRAY) ×3 IMPLANT
KIT TURNOVER KIT A (KITS) IMPLANT
NEEDLE HYPO 22GX1.5 SAFETY (NEEDLE) ×3 IMPLANT
PACK CARDIOVASCULAR III (CUSTOM PROCEDURE TRAY) ×3 IMPLANT
PAD POSITIONING PINK XL (MISCELLANEOUS) ×3 IMPLANT
SCISSORS LAP 5X45 EPIX DISP (ENDOMECHANICALS) IMPLANT
SEAL CANN UNIV 5-8 DVNC XI (MISCELLANEOUS) ×4 IMPLANT
SEAL XI 5MM-8MM UNIVERSAL (MISCELLANEOUS) ×8
SEALER VESSEL DA VINCI XI (MISCELLANEOUS) ×2
SEALER VESSEL EXT DVNC XI (MISCELLANEOUS) ×1 IMPLANT
SET TUBE SMOKE EVAC HIGH FLOW (TUBING) ×3 IMPLANT
SOLUTION ELECTROLUBE (MISCELLANEOUS) ×3 IMPLANT
SPONGE GAUZE 2X2 STER 10/PKG (GAUZE/BANDAGES/DRESSINGS) ×2
SPONGE LAP 18X18 RF (DISPOSABLE) ×3 IMPLANT
SUT ETHIBOND 0 36 GRN (SUTURE) ×6 IMPLANT
SUT ETHIBOND NAB CT1 #1 30IN (SUTURE) ×9 IMPLANT
SUT MNCRL AB 4-0 PS2 18 (SUTURE) ×3 IMPLANT
SUT PROLENE 2 0 SH DA (SUTURE) IMPLANT
SUT VICRYL 0 TIES 12 18 (SUTURE) IMPLANT
SUT VICRYL 0 UR6 27IN ABS (SUTURE) IMPLANT
SUT VLOC 180 2-0 9IN GS21 (SUTURE) IMPLANT
SYR 10ML LL (SYRINGE) ×3 IMPLANT
SYR 20ML LL LF (SYRINGE) ×3 IMPLANT
TIP INNERVISION DETACH 56FR (MISCELLANEOUS) ×2 IMPLANT
TOWEL OR 17X26 10 PK STRL BLUE (TOWEL DISPOSABLE) ×3 IMPLANT
TOWEL OR NON WOVEN STRL DISP B (DISPOSABLE) ×3 IMPLANT
TROCAR ADV FIXATION 5X100MM (TROCAR) ×3 IMPLANT

## 2018-11-03 NOTE — Discharge Instructions (Signed)
EATING AFTER YOUR ESOPHAGEAL SURGERY (Stomach Fundoplication, Hiatal Hernia repair, Achalasia surgery, etc)  ######################################################################  EAT Start with a pureed / full liquid diet (see below) Gradually transition to a high fiber diet with a fiber supplement over the next month after discharge.    WALK Walk an hour a day.  Control your pain to do that.    CONTROL PAIN Control pain so that you can walk, sleep, tolerate sneezing/coughing, go up/down stairs.  HAVE A BOWEL MOVEMENT DAILY Keep your bowels regular to avoid problems.  OK to try a laxative to override constipation.  OK to use an antidairrheal to slow down diarrhea.  Call if not better after 2 tries  CALL IF YOU HAVE PROBLEMS/CONCERNS Call if you are still struggling despite following these instructions. Call if you have concerns not answered by these instructions  ######################################################################   After your esophageal surgery, expect some sticking with swallowing over the next 1-2 months.    If food sticks when you eat, it is called "dysphagia".  This is due to swelling around your esophagus at the wrap & hiatal diaphragm repair.  It will gradually ease off over the next few months.  To help you through this temporary phase, we start you out on a pureed (blenderized) diet.  Your first meal in the hospital was thin liquids.  You should have been given a pureed diet by the time you left the hospital.  We ask patients to stay on a pureed diet for the first 2-3 weeks to avoid anything getting "stuck" near your recent surgery.  Don't be alarmed if your ability to swallow doesn't progress according to this plan.  Everyone is different and some diets can advance more or less quickly.     Some BASIC RULES to follow are:  Maintain an upright position whenever eating or drinking.  Take small bites - just a teaspoon size bite at a time.  Eat slowly.   It may also help to eat only one food at a time.  Consider nibbling through smaller, more frequent meals & avoid the urge to eat BIG meals  Do not push through feelings of fullness, nausea, or bloatedness  Do not mix solid foods and liquids in the same mouthful  Try not to "wash foods down" with large gulps of liquids.  Avoid carbonated (bubbly/fizzy) drinks.    Avoid foods that make you feel gassy or bloated.  Start with bland foods first.  Wait on trying greasy, fried, or spicy meals until you are tolerating more bland solids well.  Understand that it will be hard to burp and belch at first.  This gradually improves with time.  Expect to be more gassy/flatulent/bloated initially.  Walking will help your body manage it better.  Consider using medications for bloating that contain simethicone such as  Maalox or Gas-X   Eat in a relaxed atmosphere & minimize distractions.  Avoid talking while eating.    Do not use straws.  Following each meal, sit in an upright position (90 degree angle) for 60 to 90 minutes.  Going for a short walk can help as well  If food does stick, don't panic.  Try to relax and let the food pass on its own.  Sipping WARM LIQUID such as strong hot black tea can also help slide it down.   Be gradual in changes & use common sense:  -If you easily tolerating a certain "level" of foods, advance to the next level gradually -If you are  having trouble swallowing a particular food, then avoid it.   -If food is sticking when you advance your diet, go back to thinner previous diet (the lower LEVEL) for 1-2 days.  LEVEL 1 = PUREED DIET  Do for the first 2 WEEKS AFTER SURGERY  -Foods in this group are pureed or blenderized to a smooth, mashed potato-like consistency.  -If necessary, the pureed foods can keep their shape with the addition of a thickening agent.   -Meat should be pureed to a smooth, pasty consistency.  Hot broth or gravy may be added to the pureed  meat, approximately 1 oz. of liquid per 3 oz. serving of meat. -CAUTION:  If any foods do not puree into a smooth consistency, swallowing will be more difficult.  (For example, nuts or seeds sometimes do not blend well.)  Hot Foods Cold Foods  Pureed scrambled eggs and cheese Pureed cottage cheese  Baby cereals Thickened juices and nectars  Thinned cooked cereals (no lumps) Thickened milk or eggnog  Pureed [place] toast or pancakes Ensure  Mashed potatoes Ice cream  Pureed parsley, au gratin, scalloped potatoes, candied sweet potatoes Fruit or [place] ice, sherbet  Pureed buttered or alfredo noodles Plain yogurt  Pureed vegetables (no corn or peas) Instant breakfast  Pureed soups and creamed soups Smooth pudding, mousse, custard  Pureed scalloped apples Whipped gelatin  Gravies Sugar, syrup, honey, jelly  Sauces, cheese, tomato, barbecue, white, creamed Cream  Any baby food Creamer  Alcohol in moderation (not beer or champagne) Margarine  Coffee or tea Mayonnaise   Ketchup, mustard   Apple sauce   SAMPLE MENU:  PUREED DIET Breakfast Lunch Dinner   Orange juice, 1/2 cup  Cream of wheat, 1/2 cup  Pineapple juice, 1/2 cup  Pureed [place], barley soup, 3/4 cup  Pureed Hawaiian chicken, 3 oz   Scrambled eggs, mashed or blended with cheese, 1/2 cup  Tea or coffee, 1 cup   Whole milk, 1 cup   Non-dairy creamer, 2 Tbsp.  Mashed potatoes, 1/2 cup  Pureed cooled broccoli, 1/2 cup  Apple sauce, 1/2 cup  Coffee or tea  Mashed potatoes, 1/2 cup  Pureed spinach, 1/2 cup  Frozen yogurt, 1/2 cup  Tea or coffee      LEVEL 2 = SOFT DIET  After your first 2 weeks, you can advance to a soft diet.   Keep on this diet until everything goes down easily.  Hot Foods Cold Foods  White fish Cottage cheese  Stuffed fish Junior baby fruit  Baby food meals Semi thickened juices  Minced soft cooked, scrambled, poached eggs nectars  Souffle & omelets Ripe mashed bananas  Cooked  cereals Canned fruit, pineapple sauce, milk  potatoes Milkshake  Buttered or Alfredo noodles Custard  Cooked cooled vegetable Puddings, including tapioca  Sherbet Yogurt  Vegetable soup or alphabet soup Fruit ice, [place] ice  Gravies Whipped gelatin  Sugar, syrup, honey, jelly Junior baby desserts  Sauces:  Cheese, creamed, barbecue, tomato, white Cream  Coffee or tea Margarine   SAMPLE MENU:  LEVEL 2 Breakfast Lunch Dinner   Orange juice, 1/2 cup  Oatmeal, 1/2 cup  Scrambled eggs with cheese, 1/2 cup  Decaffeinated tea, 1 cup  Whole milk, 1 cup  Non-dairy creamer, 2 Tbsp  Pineapple juice, 1/2 cup  Minced beef, 3 oz  Gravy, 2 Tbsp  Mashed potatoes, 1/2 cup  Minced fresh broccoli, 1/2 cup  Applesauce, 1/2 cup  Coffee, 1 cup  [place], barley soup, 3/4 cup  Minced Hawaiian chicken, 3 oz  Mashed potatoes, 1/2 cup  Cooked spinach, 1/2 cup  Frozen yogurt, 1/2 cup  Non-dairy creamer, 2 Tbsp      LEVEL 3 = CHOPPED DIET  -After all the foods in level 2 (soft diet) are passing through well you should advance up to more chopped foods.  -It is still important to cut these foods into small pieces and eat slowly.  Hot Foods Cold Foods  Poultry Cottage cheese  Chopped Swedish meatballs Yogurt  Meat salads (ground or flaked meat) Milk  Flaked fish (tuna) Milkshakes  Poached or scrambled eggs Soft, cold, dry cereal  Souffles and omelets Fruit juices or nectars  Cooked cereals Chopped canned fruit  Chopped [place] toast or pancakes Canned fruit cocktail  Noodles or pasta (no rice) Pudding, mousse, custard  Cooked vegetables (no frozen peas, corn, or mixed vegetables) Green salad  Canned small sweet peas Ice cream  Creamed soup or vegetable soup Fruit ice, [place] ice  Pureed vegetable soup or alphabet soup Non-dairy creamer  Ground scalloped apples Margarine  Gravies Mayonnaise  Sauces:  Cheese, creamed, barbecue, tomato, white Ketchup  Coffee or tea Mustard    SAMPLE MENU:  LEVEL 3 Breakfast Lunch Dinner   Orange juice, 1/2 cup  Oatmeal, 1/2 cup  Scrambled eggs with cheese, 1/2 cup  Decaffeinated tea, 1 cup  Whole milk, 1 cup  Non-dairy creamer, 2 Tbsp  Ketchup, 1 Tbsp  Margarine, 1 tsp  Salt, 1/4 tsp  Sugar, 2 tsp  Pineapple juice, 1/2 cup  Ground beef, 3 oz  Gravy, 2 Tbsp  Mashed potatoes, 1/2 cup  Cooked spinach, 1/2 cup  Applesauce, 1/2 cup  Decaffeinated coffee  Whole milk  Non-dairy creamer, 2 Tbsp  Margarine, 1 tsp  Salt, 1/4 tsp  Pureed [place], barley soup, 3/4 cup  Barbecue chicken, 3 oz  Mashed potatoes, 1/2 cup  Ground fresh broccoli, 1/2 cup  Frozen yogurt, 1/2 cup  Decaffeinated tea, 1 cup  Non-dairy creamer, 2 Tbsp  Margarine, 1 tsp  Salt, 1/4 tsp  Sugar, 1 tsp    LEVEL 4:  REGULAR FOODS  -Foods in this group are soft, moist, regularly textured foods.   -This level includes meat and breads, which tend to be the hardest things to swallow.   -Eat very slowly, chew well and continue to avoid carbonated drinks. -most people are at this level in 4-6 weeks  Hot Foods Cold Foods  Baked fish or skinned Soft cheeses - cottage cheese  Souffles and omelets Cream cheese  Eggs Yogurt  Stuffed shells Milk  Spaghetti with meat sauce Milkshakes  Cooked cereal Cold dry cereals (no nuts, dried fruit, coconut)  [place] toast or pancakes Crackers  Buttered toast Fruit juices or nectars  Noodles or pasta (no rice) Canned fruit  Potatoes (all types) Ripe bananas  Soft, cooked vegetables (no corn, lima, or baked beans) Peeled, ripe, fresh fruit  Creamed soups or vegetable soup Cakes (no nuts, dried fruit, coconut)  Canned chicken noodle soup Plain doughnuts  Gravies Ice cream  Bacon dressing Pudding, mousse, custard  Sauces:  Cheese, creamed, barbecue, tomato, white Fruit ice, [place] ice, sherbet  Decaffeinated tea or coffee Whipped gelatin  Pork chops Regular gelatin   Canned fruited  gelatin molds   Sugar, syrup, honey, jam, jelly   Cream   Non-dairy   Margarine   Oil   Mayonnaise   Ketchup   Mustard   TROUBLESHOOTING IRREGULAR BOWELS  1) Avoid extremes of bowel  movements (no bad constipation/diarrhea)  2) Miralax 17gm mixed in [place]. water or juice-daily. May use BID as needed.  3) Gas-x,Phazyme, etc. as needed for gas & bloating.  4) Soft,bland diet. No spicy,greasy,fried foods.  5) Prilosec over-the-counter as needed  6) May hold gluten/wheat products from diet to see if symptoms improve.  7) May try probiotics (Align, Activa, etc) to help calm the bowels down  7) If symptoms become worse call back immediately.    If you have any questions please call our office at [place]: [phone].   LAPAROSCOPIC SURGERY: POST OP INSTRUCTIONS  ######################################################################  EAT Gradually transition to a high fiber diet with a fiber supplement over the next few weeks after discharge.  Start with a pureed / full liquid diet (see below)  WALK Walk an hour a day.  Control your pain to do that.    CONTROL PAIN Control pain so that you can walk, sleep, tolerate sneezing/coughing, go up/down stairs.  HAVE A BOWEL MOVEMENT DAILY Keep your bowels regular to avoid problems.  OK to try a laxative to override constipation.  OK to use an antidairrheal to slow down diarrhea.  Call if not better after 2 tries  CALL IF YOU HAVE PROBLEMS/CONCERNS Call if you are still struggling despite following these instructions. Call if you have concerns not answered by these instructions  ######################################################################    1. DIET: Follow a light bland diet & liquids the first 24 hours after arrival home, such as soup, liquids, starches, etc.  Be sure to drink plenty of fluids.  Quickly advance to a usual solid diet within a few days.  Avoid fast food or heavy meals as your are more likely  to get nauseated or have irregular bowels.  A low-fat, high-fiber diet for the rest of your life is ideal.  2. Take your usually prescribed home medications unless otherwise directed.  3. PAIN CONTROL: a. Pain is best controlled by a usual combination of three different methods TOGETHER: i. Ice/Heat ii. Over the counter pain medication iii. Prescription pain medication b. Most patients will experience some swelling and bruising around the incisions.  Ice packs or heating pads (30-60 minutes up to 6 times a day) will help. Use ice for the first few days to help decrease swelling and bruising, then switch to heat to help relax tight/sore spots and speed recovery.  Some people prefer to use ice alone, heat alone, alternating between ice & heat.  Experiment to what works for you.  Swelling and bruising can take several weeks to resolve.   c. It is helpful to take an over-the-counter pain medication regularly for the first few weeks.  Choose one of the following that works best for you: i. Naproxen (Aleve, etc)  Two 220mg  tabs twice a day ii. Ibuprofen (Advil, etc) Three 200mg  tabs four times a day (every meal & bedtime) iii. Acetaminophen (Tylenol, etc) 500-650mg  four times a day (every meal & bedtime) d. A  prescription for pain medication (such as oxycodone, hydrocodone, tramadol, gabapentin, methocarbamol, etc) should be given to you upon discharge.  Take your pain medication as prescribed.  i. If you are having problems/concerns with the prescription medicine (does not control pain, nausea, vomiting, rash, itching, etc), please call us [phone] to see if we need to switch you to a different pain medicine that will work better for you and/or control your side effect better. ii. If you need a refill on your pain medication, please give [place] 48  hour notice.  contact your pharmacy.  They will contact our office to request authorization. Prescriptions will not be filled after 5 pm or on  week-ends  4. Avoid getting constipated.   a. Between the surgery and the pain medications, it is common to experience some constipation.   b. Increasing fluid intake and taking a fiber supplement (such as Metamucil, Citrucel, FiberCon, MiraLax, etc) 1-2 times a day regularly will usually help prevent this problem from occurring.   c. A mild laxative (prune juice, Milk of Magnesia, MiraLax, etc) should be taken according to package directions if there are no bowel movements after 48 hours.   5. Watch out for diarrhea.   a. If you have many loose bowel movements, simplify your diet to bland foods & liquids for a few days.   b. Stop any stool softeners and decrease your fiber supplement.   c. Switching to mild anti-diarrheal medications (Kayopectate, Pepto Bismol) can help.   d. If this worsens or does not improve, please call us.  6. Wash / shower every day.  You may shower over the dressings as they are waterproof.  Continue to shower over incision(s) after the dressing is off.  7. Remove your waterproof bandages 5 days after surgery.  You may leave the incision open to air.  You may replace a dressing/Band-Aid to cover the incision for comfort if you wish.   8. ACTIVITIES as tolerated:   a. You may resume regular (light) daily activities beginning the next day--such as daily self-care, walking, climbing stairs--gradually increasing activities as tolerated.  If you can walk 30 minutes without difficulty, it is safe to try more intense activity such as jogging, treadmill, bicycling, low-impact aerobics, swimming, etc. b. Save the most intensive and strenuous activity for last such as sit-ups, heavy lifting, contact sports, etc  Refrain from any heavy lifting or straining until you are off narcotics for pain control.   c. DO NOT PUSH THROUGH PAIN.  Let pain be your guide: If it hurts to do something, don't do it.  Pain is your body warning you to avoid that activity for another week until the pain  goes down. d. You may drive when you are no longer taking prescription pain medication, you can comfortably wear a seatbelt, and you can safely maneuver your car and apply brakes. e. Dennis Bast may have sexual intercourse when it is comfortable.  9. FOLLOW UP in our office a. Please call [facility] at [phone] to set up an appointment to see your surgeon in the office for a follow-up appointment approximately 2-3 weeks after your surgery. b. Make sure that you call for this appointment the day you arrive home to insure a convenient appointment time.  10. IF YOU HAVE DISABILITY OR FAMILY LEAVE FORMS, BRING THEM TO THE OFFICE FOR PROCESSING.  DO NOT GIVE THEM TO YOUR DOCTOR.   WHEN TO CALL us [phone]: 1. Poor pain control 2. Reactions / problems with new medications (rash/itching, nausea, etc)  3. Fever over 101.5 F (38.5 C) 4. Inability to urinate 5. Nausea and/or vomiting 6. Worsening swelling or bruising 7. Continued bleeding from incision. 8. Increased pain, redness, or drainage from the incision   The clinic staff is available to answer your questions during regular business hours (8:30am-5pm).  Please dont hesitate to call and ask to speak to one of our nurses for clinical concerns.   If you have a medical emergency, go to the nearest emergency room or call 911.  A surgeon from [facility] is always on call at the [facility], [place], [place], [place], [place]  [id number] ? MAIN: [phone] ? TOLL FREE: [phone] ?  FAX [phone] [email or web address]

## 2018-11-03 NOTE — Anesthesia Postprocedure Evaluation (Signed)
Anesthesia Post Note  Patient: Travis Kaufman  Procedure(s) Performed: XI ROBOTIC ASSISTED NISSEN FUNDOPLICATION (N/A Abdomen)     Patient location during evaluation: PACU Anesthesia Type: General Level of consciousness: awake and alert Pain management: pain level controlled Vital Signs Assessment: post-procedure vital signs reviewed and stable Respiratory status: spontaneous breathing, nonlabored ventilation, respiratory function stable and patient connected to nasal cannula oxygen Cardiovascular status: blood pressure returned to baseline and stable Postop Assessment: no apparent nausea or vomiting Anesthetic complications: no    Last Vitals:  Vitals:   11/03/18 1516 11/03/18 1623  BP: 126/70 113/77  Pulse: 81 68  Resp:  16  Temp: 37.1 C 37.1 C  SpO2: 98% 95%    Last Pain:  Vitals:   11/03/18 1623  TempSrc: Oral  PainSc:                  Ryan P Ellender

## 2018-11-03 NOTE — Transfer of Care (Signed)
Immediate Anesthesia Transfer of Care Note  Patient: Travis Kaufman  Procedure(s) Performed: XI ROBOTIC ASSISTED NISSEN FUNDOPLICATION (N/A Abdomen)  Patient Location: PACU  Anesthesia Type:General  Level of Consciousness: awake, alert  and oriented  Airway & Oxygen Therapy: Patient Spontanous Breathing and Patient connected to face mask oxygen  Post-op Assessment: Report given to RN and Post -op Vital signs reviewed and stable  Post vital signs: Reviewed and stable  Last Vitals:  Vitals Value Taken Time  BP 159/97 11/03/18 1052  Temp    Pulse 93 11/03/18 1054  Resp 21 11/03/18 1054  SpO2 100 % 11/03/18 1054  Vitals shown include unvalidated device data.  Last Pain:  Vitals:   11/03/18 0717  TempSrc:   PainSc: 0-No pain         Complications: No apparent anesthesia complications

## 2018-11-03 NOTE — Anesthesia Procedure Notes (Signed)
Procedure Name: Intubation Date/Time: 11/03/2018 8:37 AM Performed by: Cynda Familia, CRNA Pre-anesthesia Checklist: Patient identified, Emergency Drugs available, Suction available and Patient being monitored Patient Re-evaluated:Patient Re-evaluated prior to induction Oxygen Delivery Method: Circle System Utilized Preoxygenation: Pre-oxygenation with 100% oxygen Induction Type: IV induction Ventilation: Mask ventilation without difficulty Laryngoscope Size: Miller and 2 Grade View: Grade I Tube type: Oral Number of attempts: 1 Airway Equipment and Method: Stylet Placement Confirmation: ETT inserted through vocal cords under direct vision,  positive ETCO2 and breath sounds checked- equal and bilateral Secured at: 22 cm Tube secured with: Tape Dental Injury: Teeth and Oropharynx as per pre-operative assessment  Comments: Smooth IV induction Ellender-- intubation AM CRNA atraumatic-- slight chipped present on left upper front and lateral teeth-- unchanged with laryngoscopy-- bilat BS Ellender

## 2018-11-03 NOTE — Op Note (Signed)
11/03/2018  10:58 AM  PATIENT:  Travis Kaufman  48 y.o. male  Patient Care Team: Luetta Nutting, DO as PCP - General (Family Medicine) Sueanne Margarita, MD as PCP - Cardiology (Cardiology) Michael Boston, MD as Consulting Physician (General Surgery) Ronnette Juniper, MD as Consulting Physician (Gastroenterology)  PRE-OPERATIVE DIAGNOSIS:  GERD REFRACTORY TO MEDICAL MANAGEMENT  POST-OPERATIVE DIAGNOSIS:  GERD & CHRONIC ESOPHAGITIS REFRACTORY TO MEDICAL MANAGEMENT  PROCEDURE:   ROBOTIC Nissen fundoplication 2 cm over a 56-French bougie Type II mediastinal dissection Anterior & posterior gastropexY  SURGEON:  Adin Hector, MD, FACS  ASSIST:  Leighton Ruff, MD, FACS. An experienced assistant was required given the standard of surgical care given the complexity of the case.  This assistant was needed for exposure, dissection, suctioning, retraction, instrument exchange, etc.  ANESTHESIA:   local and general   Nerve block provided with liposomal bupivacaine (Experel) mixed with 0.25% bupivacaine as a Bilateral TAP block x 44mL each side at the level of costal ridge from the epigastric region down towards the mid flank along the flank at the anterior axillary line, from subcostal ridge to iliac crest under laparoscopic guidance    EBL:  Total I/O In: 1820 [I.V.:1620; IV Piggyback:200] Out: 25 [Blood:25]  Delay start of Pharmacological VTE agent (>24hrs) due to surgical blood loss or risk of bleeding:  no  ANESTHESIA: 1. General anesthesia. 2. Local anesthetic in a field block around all port sites.  SPECIMEN:  NONE  DRAINS:  NONE  COUNTS:  YES  PLAN OF CARE: Admit for overnight observation  PATIENT DISPOSITION:  PACU - hemodynamically stable.  INDICATION:   Pleasant patient with strong family history of significant heartburn and reflux.  5 required fundoplication to control.  Persistent chest pain and esophagitis despite management by gastroenterology and and acid blockade.   No evidence of any esophageal dysmotility other possible etiologies.  Negative cardiac work-up.  Recommendation made for fundoplication.  The patient has had extensive work-up & we feel the patient will benefit from repair:  The anatomy & physiology of the foregut and anti-reflux mechanism was discussed.  The pathophysiology of hiatal herniation and GERD was discussed.  Natural history risks without surgery was discussed.   The patient's symptoms are not adequately controlled by medicines and other non-operative treatments.  I feel the risks of no intervention will lead to serious problems that outweigh the operative risks; therefore, I recommended surgery to reduce the hiatal hernia out of the chest and fundoplication to rebuild the anti-reflux valve and control reflux better.  Need for a thorough workup to rule out the differential diagnosis and plan treatment was explained.  I explained laparoscopic techniques with possible need for an open approach.  Risks such as bleeding, infection, abscess, leak, need for further treatment, heart attack, death, and other risks were discussed.   I noted a good likelihood this will help address the problem.  Goals of post-operative recovery were discussed as well.  Possibility that this will not correct all symptoms was explained.  Post-operative dysphagia, need for short-term liquid & pureed diet, inability to vomit, possibility of reherniation, possible need for medicines to help control symptoms in addition to surgery were discussed.  We will work to minimize complications.   Educational handouts further explaining the pathology, treatment options, and dysphagia diet was given as well.  Questions were answered.  The patient expresses understanding & wishes to proceed with surgery.  OR FINDINGS:  Inflammation consistent with distal esophagitis.  No obvious hiatal  hernia.  It is a primary repair over pledgets.   The patient has a 2 cm Nissen fundoplication that was  done over 56-French bougie.  The patient has had anterior and posterior gastropexy.  DESCRIPTION:   Informed consent was confirmed.  The patient received IV antibiotics prior to incision.  The underwent general anesthesia without difficulty.  A Foley catheter sterilely placed.  The patient was positioned in split leg with arms tucked. The abdomen was prepped and draped in the sterile fashion.  Surgical time-out confirmed our plan.  I placed a 8 mm port in the left upper abdomen using Varess entry technique with the patient in steep reverse Trendelenburg and left side up.  Entry was clean.  We induced carbon dioxide insufflation.  Camera inspection revealed no injury.  Under direct visualization, I placed extra ports.  I also placed a 5 mm port in the left subxiphoid region under direct visualization.  I removed that and placed an Omega-shaped rigid Nathanson liver retractor to lift the left lateral sector of the liver anteriorly to expose the esophageal hiatus.  This was secured to the bed using the iron man system.  The Xi robot was carefully docked and instruments placed and advanced under direct visualization.  We focused on dissection.  There is no obvious large hiatal hernia.  Some inflammation and edema consistent with chronic esophagitis.  Is able to grab the anterior epiphrenic pad.  Came through hepatogastric ligament and identified the right crus.  Entered into the anterior mediastinum bluntly between the esophagus and superomedial right crus.  I transected phrenoesophageal attachments to the inner right crus until I found the base of the crura.  I then came around anteriorly on the left side and freed up the phrenoesophageal attachments of the mediastinal sac on the medial part of the left crus on the superior half.  Freed some attachments of the fundus to the left diaphragm and crus to come down on the medial inferior phrenoesophageal attachments along the posterior left crus.  We ligated the  short gastrics along the lesser curvature of the stomach about a third the way down and then came up proximally over the fundus.  We released the attachments of the stomach to the retroperitoneum until we were able to connect with the prior dissection on the left crus.  We completed the release of phrenoesophageal attachments to the medial part of the left crus down to its base.  With this, we had circumferential mobilization.    We placed the stomach and esophagus on axial tension.  I then did a Type II mediastinal dissection where I freed the esophagus from its attachments to the aorta, spine, pleura, and pericardium using primarily gentle blunt as well as focused ultrasonic dissection.  He had obvious inflammation and edema..  We saw the anterior & posterior vagus nerves were able to keep them intact.  We preserved it at all times.  I procedded to dissect about 15 cm proximally into the mediastinum.  With that I could straighten out the esophagus and get 5 cm of intra-abdominal length of the esophagus at a best estimation.  I dissected out & removed the fatty   epiphrenic pads at the esophagogastric junction, preserving the left anterior and right posterior vagal nerves. With that, I could better define the esophagogastric junction.  I confirmed the the patient had 5 cm of intra-abdominal esophageal length off tension.  I brought the fundus of the stomach posterior to the esophagus over to the right  side.  The wrap was mobile with the classic shoe shine maneuver.  Wrap became together gently.  We reflected the stomach left laterally and closed the esophageal hiatus using 0 Ethibond stitch using horizontal mattress stitches with pledgets on both sides.  I did that x3 stitches.  The most cephalad stitch I did is a horizontal mattress suture without pledgets since he did not have a giant hiatal hernia and things came together well.  The crura had good substance and they came together well without any  tension.  I brought the fundus of the stomach behind the esophagus and cardia to set up a fundoplication wrap.  I did a posterior gastropexy by taking of #1 Ethibond stitches to the posterior part of the right side of the wrap and thru the crural closure.  Next, Anesthesia passed a 56-French bougie transorally.  It was a lighted bougie and we did do this under axial tension.  It passed down easily without resistance.    I then did a classic 2 cm Nissen fundoplication on the true esophagus above the cardia using Ethibond stitch in the left side of the wrap, then anterior esophagus, and then right side of the wrap and tied that down. Did 3 stitches.  I measured it and it was 2 cm in length.  We removed the bougie.  It was intact.  I placed a stitch on the left anterior part of the wrap to the left anterior crus.  I did a similar stitch on the right side.  Therefore right and left anterior gastropexy's as well.  The hiatal closure was snug but allowed a large grasper to easily passed through, implying no stricture.  The wrap was soft and floppy.  I placed a drain as noted above.  I did irrigation and ensured hemostasis.  I saw no evidence of any leak or perforation or other abnormality.  I removed the [facility] liver retractor under direct visualization.  I evacuated carbon dioxide and removed the ports.  The skin was closed with Monocryl and sterile dressings applied.  The patient is being extubated and brought back to the recovery room.  I discussed postop care in detail with the patient and family in in the office.  Discussed again with the patient in the holding area. I discussed operative findings, updated the patient's status, discussed probable steps to recovery, and gave postoperative recommendations to the patient's spouse, Lolita Patella.   Recommendations were made.  Questions were answered.  She expressed understanding & appreciation.    Adin Hector, M.D., F.A.C.S. Gastrointestinal and Minimally  Invasive Surgery [facility] [address] [place], [place] [address] [phone] Main / Paging

## 2018-11-03 NOTE — H&P (Addendum)
Travis Kaufman  DOB: Feb 23, 1971 Single / Language: Cleophus Molt / Race: White Male   Patient Care Team: Luetta Nutting, DO as PCP - General (Family Medicine) Sueanne Margarita, MD as PCP - Cardiology (Cardiology) Michael Boston, MD as Consulting Physician (General Surgery) Ronnette Juniper, MD as Consulting Physician (Gastroenterology) Marland Kitchen ` Patient sent for surgical consultation at the request of Dr Acie Fredrickson GI  Chief Complaint: Intermittent chest and epigastric pain with negative cardiac workup. Consideration of fundoplication for reflux ` ` The patient has struggled with heartburn and reflux for some time. He's been on antacid medications for quite some time. An endoscopy showed evidence of esophagitis in the past. He had an episode of severe chest and epigastric pain. He was admitted to rule out myocardial infarction. Cardiac catheterization showed no evidence of coronary disease with normal systolic function. Consistent with noncardiac chest pain. Given his history of reflux, gastroenterology was consulted. Endoscopy showed some esophagitis. Medication adjustments were recommended. That did not help like they had in the past, so surgical follow-up. Patient is now my office one month later from that evaluation in the hospital. He does struggle with some anxiety. He talks by burning chest pain occasionally with exertion. Raiding to the left axilla. This usually related to heartburn and reflux. We'll get diaphoretic and dizzy. He has some baseline bradycardia with his heart rate usually in the 40 to 50s.  Patient has had reflux for many many years. He's been on meds for at least the past 5 years if not longer. He's not convinced that they've helped much although he's has worse symptoms without them. Eats a low fat diet. He tries to avoid spicy or greasy foods. He has a glass of wine every other day. Can tolerate that. Does not like to do heavy drinking. Does not smoke. He  can walk several miles without difficulty. He moves his bowels every day. No early satiety or bloating. He gets burning often after he swallows. No definite dysphagia to solids or liquids but some burning and discomfort with it. He cannot sleep supine as he starts getting reflux and heartburn. The being on a few pillows helps. He does not notice any major issues bending over. No issues with asthma or pneumonias or sore throats or dental caries.  He notes that his father had an open Nissen fundoplication decades ago for the exact symptoms & and that was the best thing ever. He notes his sister has bad heartburn / reflux. She's been trying to use medicines to control since she is afraid of surgery.  (Review of systems as stated in this history (HPI) or in the review of systems. Otherwise all other 12 point ROS are negative) ` ` `  Diagnosis 1. Stomach, biopsy, antrum - ANTRAL MUCOSA WITH MILD CHRONIC ACTIVE INFLAMMATION. - WARTHIN-STARRY STAIN NEGATIVE FOR HELICOBACTER PYLORI. - NO INTESTINAL METAPLASIA, DYSPLASIA OR MALIGNANCY. 2. Gastroesophageal, biopsy, [facility] [place] - GASTROESOPHAGEAL MUCOSA WITH SLIGHT INFLAMMATION CONSISTENT WITH REFLUX. - NO INTESTINAL METAPLASIA, DYSPLASIA OR MALIGNANCY. Claudette Laws MD Pathologist, Electronic Signature (Case signed 08/18/2018)   Past Surgical History Sabino Gasser, [place]; 09/07/2018 8:51 AM) No pertinent past surgical history   Allergies Sabino Gasser, [place]; 09/07/2018 8:51 AM) No Known Drug Allergies  [09/07/2018]: Allergies Reconciled   Medication History Sabino Gasser, CMA; 09/07/2018 8:52 AM) Atorvastatin Calcium (40MG  Tablet, Oral) Active. Dexlansoprazole (30MG  Capsule DR, Oral) Active. Medications Reconciled  Social History Sabino Gasser, CMA; 09/07/2018 8:51 AM) Alcohol use  Occasional alcohol use. Caffeine use  Coffee. No drug use  Tobacco use  Former smoker.  Family History Sabino Gasser, Miranda; 09/07/2018 8:51  AM) Breast Cancer  Mother.  Other Problems Sabino Gasser, CMA; 09/07/2018 8:51 AM) Gastroesophageal Reflux Disease     Review of Systems Sabino Gasser CMA; 09/07/2018 8:51 AM) General Not Present- Appetite Loss, Chills, Fatigue, Fever, Night Sweats, Weight Gain and Weight Loss. Skin Not Present- Change in Wart/Mole, Dryness, Hives, Jaundice, New Lesions, Non-Healing Wounds, Rash and Ulcer. HEENT Not Present- Earache, Hearing Loss, Hoarseness, Nose Bleed, Oral Ulcers, Ringing in the Ears, Seasonal Allergies, Sinus Pain, Sore Throat, Visual Disturbances, Wears glasses/contact lenses and Yellow Eyes. Respiratory Not Present- Bloody sputum, Chronic Cough, Difficulty Breathing, Snoring and Wheezing. Breast Not Present- Breast Mass, Breast Pain, Nipple Discharge and Skin Changes. Cardiovascular Not Present- Chest Pain, Difficulty Breathing Lying Down, Leg Cramps, Palpitations, Rapid Heart Rate, Shortness of Breath and Swelling of Extremities. Gastrointestinal Present- Abdominal Pain, Bloating and Excessive gas. Not Present- Bloody Stool, Change in Bowel Habits, Chronic diarrhea, Constipation, Difficulty Swallowing, Gets full quickly at meals, Hemorrhoids, Indigestion, Nausea, Rectal Pain and Vomiting. Male Genitourinary Not Present- Blood in Urine, Change in Urinary Stream, Frequency, Impotence, Nocturia, Painful Urination, Urgency and Urine Leakage. Musculoskeletal Not Present- Back Pain, Joint Pain, Joint Stiffness, Muscle Pain, Muscle Weakness and Swelling of Extremities. Neurological Not Present- Decreased Memory, Fainting, Headaches, Numbness, Seizures, Tingling, Tremor, Trouble walking and Weakness. Psychiatric Not Present- Anxiety, Bipolar, Change in Sleep Pattern, Depression, Fearful and Frequent crying. Endocrine Not Present- Cold Intolerance, Excessive Hunger, Hair Changes, Heat Intolerance, Hot flashes and New Diabetes. Hematology Not Present- Blood Thinners, Easy Bruising, Excessive  bleeding, Gland problems, HIV and Persistent Infections.  Vitals Sabino Gasser CMA; 09/07/2018 8:54 AM) 09/07/2018 8:52 AM Weight: 185.5 lb Height: 71in Body Surface Area: 2.04 m Body Mass Index: 25.87 kg/m  Temp.: 98.40F (Oral)  Pulse: 62 (Regular)  BP: 126/82(Sitting, Left Arm, Standard)       Physical Exam Travis Hector MD; 09/07/2018 9:25 AM) General Mental Status-Alert. General Appearance-Not in acute distress, Not Sickly. Orientation-Oriented X3. Hydration-Well hydrated. Voice-Normal. Note: Local.. Moves normally. No guarding. Not overweight.   Integumentary Global Assessment Upon inspection and palpation of skin surfaces of the - Axillae: non-tender, no inflammation or ulceration, no drainage. and Distribution of scalp and body hair is normal. General Characteristics Temperature - normal warmth is noted.  Head and Neck Head-normocephalic, atraumatic with no lesions or palpable masses. Face Global Assessment - atraumatic, no absence of expression. Neck Global Assessment - no abnormal movements, no bruit auscultated on the right, no bruit auscultated on the left, no decreased range of motion, non-tender. Trachea-midline. Thyroid Gland Characteristics - non-tender.  Eye Eyeball - Left-Extraocular movements intact, No Nystagmus - Left. Eyeball - Right-Extraocular movements intact, No Nystagmus - Right. Cornea - Left-No Hazy - Left. Cornea - Right-No Hazy - Right. Sclera/Conjunctiva - Left-No scleral icterus, No Discharge - Left. Sclera/Conjunctiva - Right-No scleral icterus, No Discharge - Right. Pupil - Left-Direct reaction to light normal. Pupil - Right-Direct reaction to light normal.  ENMT Ears Pinna - Left - no drainage observed, no generalized tenderness observed. Pinna - Right - no drainage observed, no generalized tenderness observed. Nose and Sinuses External Inspection of the Nose - no destructive lesion  observed. Inspection of the nares - Left - quiet respiration. Inspection of the nares - Right - quiet respiration. Mouth and Throat Lips - Upper Lip - no fissures observed, no pallor noted. Lower Lip - no fissures observed, no pallor noted.  Nasopharynx - no discharge present. Oral Cavity/Oropharynx - Tongue - no dryness observed. Oral Mucosa - no cyanosis observed. Hypopharynx - no evidence of airway distress observed.  Chest and Lung Exam Inspection Movements - Normal and Symmetrical. Accessory muscles - No use of accessory muscles in breathing. Palpation Palpation of the chest reveals - Non-tender. Auscultation Breath sounds - Normal and Clear.  Cardiovascular Auscultation Rhythm - Regular. Murmurs & Other Heart Sounds - Auscultation of the heart reveals - No Murmurs and No Systolic Clicks.  Abdomen Inspection Inspection of the abdomen reveals - No Visible peristalsis and No Abnormal pulsations. Umbilicus - No Bleeding, No Urine drainage. Palpation/Percussion Palpation and Percussion of the abdomen reveal - Soft, Non Tender, No Rebound tenderness, No Rigidity (guarding) and No Cutaneous hyperesthesia. Note: Abdomen soft. Nontender. Not distended. No umbilical or incisional hernias. No guarding.   Male Genitourinary Sexual Maturity Tanner 5 - Adult hair pattern and Adult penile size and shape. Note: ` ` ` Patient sent for surgical consultation at the request of  Chief Complaint: ` ` The patient is a  (Review of systems as stated in this history (HPI) or in the review of systems. Otherwise all other 12 point ROS are negative) ` ` `   Peripheral Vascular Upper Extremity Inspection - Left - No Cyanotic nailbeds - Left, Not Ischemic. Inspection - Right - No Cyanotic nailbeds - Right, Not Ischemic.  Neurologic Neurologic evaluation reveals -normal attention span and ability to concentrate, able to name objects and repeat phrases. Appropriate fund of knowledge ,  normal sensation and normal coordination. Mental Status Affect - not angry, not paranoid. Cranial Nerves-Normal Bilaterally. Gait-Normal.  Neuropsychiatric Mental status exam performed with findings of-able to articulate well with normal speech/language, rate, volume and coherence, thought content normal with ability to perform basic computations and apply abstract reasoning and no evidence of hallucinations, delusions, obsessions or homicidal/suicidal ideation.  Musculoskeletal Global Assessment Spine, Ribs and Pelvis - no instability, subluxation or laxity. Right Upper Extremity - no instability, subluxation or laxity.  Lymphatic Head & Neck  General Head & Neck Lymphatics: Bilateral - Description - No Localized lymphadenopathy. Axillary  General Axillary Region: Bilateral - Description - No Localized lymphadenopathy. Femoral & Inguinal  Generalized Femoral & Inguinal Lymphatics: Left - Description - No Localized lymphadenopathy. Right - Description - No Localized lymphadenopathy.    Assessment & Plan Travis Hector MD; 09/07/2018 9:25 AM)  GASTROESOPHAGEAL REFLUX DISEASE WITH ESOPHAGITIS (K21.0) Impression: Evidence of reflux by EGD biopsy despite being on chronic an antacid medications. Story suspicious for heartburn and reflux issues with negative cardiac workup.  I instincts as he would benefit from fundoplication as it benefited his father.  However, some of his symptoms are atypical. I would like to complete the workup and give manometry.  He does not give any suspicion of delayed gastric emptying or gastroparesis so hold off on any further studies.  If esophageal manometry is underwhelming, then a more convinced he would benefit from fundoplication and recommend minimally evasive Nissen. There is no strong evidence of any giant hiatal hernia, so doubt I need any mesh reinforcement. Probably just suturing and fundoplication only. That plan may change  interoperatively. While he is not excited about surgery, he is optimistic this will help turn things around.  Current Plans Follow Up - Call [facility] office after tests / studies doneto discuss further plans Pt Education - [facility] - General recommendations Pt Education - [facility] Esophageal Surgery Diet HCI (Bomani Oommen): discussed with patient and provided  information. Pt Education - [facility] Laparoscopic Surgery HCI  CHEST PAIN, NON-CARDIAC (R07.89) Impression: Given his episodes of severe pain radiating to his chest and not necessarily improvement on PPIs, I would definitely want to get manometry first to rule out diffuse esophageal spasm or nutcracker esophagus or other esophageal dysmotility disorders.  If manometry shows normal esophageal function without any concerned of any dysmotility or other pathognomonic problems, then I more convinced that reflux is the cause any would benefit from surgery. The fact that his father had similar symptoms and had marked improvement after his fundoplication decades ago makes me guardedly hopeful.  ADDENDUM:  W/U COMPLETE MANOMETRY SHOWS mild inc LES pressure.  No dysmotility READY FOR SURGERY

## 2018-11-04 ENCOUNTER — Observation Stay (HOSPITAL_COMMUNITY): Payer: BC Managed Care – PPO

## 2018-11-04 DIAGNOSIS — K210 Gastro-esophageal reflux disease with esophagitis: Secondary | ICD-10-CM | POA: Diagnosis not present

## 2018-11-04 DIAGNOSIS — Z885 Allergy status to narcotic agent status: Secondary | ICD-10-CM | POA: Diagnosis not present

## 2018-11-04 DIAGNOSIS — E785 Hyperlipidemia, unspecified: Secondary | ICD-10-CM | POA: Diagnosis not present

## 2018-11-04 DIAGNOSIS — Z79899 Other long term (current) drug therapy: Secondary | ICD-10-CM | POA: Diagnosis not present

## 2018-11-04 DIAGNOSIS — K222 Esophageal obstruction: Secondary | ICD-10-CM | POA: Diagnosis not present

## 2018-11-04 MED ORDER — LACTATED RINGERS IV BOLUS: 1000.0000 mL | Freq: Three times a day (TID) | INTRAVENOUS | Status: DC | PRN

## 2018-11-04 MED ORDER — SODIUM CHLORIDE 0.9 % IV SOLN: 250.0000 mL | INTRAVENOUS | Status: DC | PRN

## 2018-11-04 MED ORDER — SODIUM CHLORIDE 0.9% FLUSH
3.0000 mL | Freq: Two times a day (BID) | INTRAVENOUS | Status: DC
  Administered 2018-11-04: 22:00:00 3 mL via INTRAVENOUS

## 2018-11-04 MED ORDER — ALBUMIN HUMAN 5 % IV SOLN: 12.5000 g | Freq: Four times a day (QID) | INTRAVENOUS | Status: DC | PRN

## 2018-11-04 MED ORDER — SODIUM CHLORIDE 0.9% FLUSH: 3.0000 mL | INTRAVENOUS | Status: DC | PRN

## 2018-11-04 MED ORDER — IOHEXOL 300 MG/ML  SOLN
50.0000 mL | Freq: Once | INTRAMUSCULAR | Status: AC | PRN
  Administered 2018-11-04: 50 mL via ORAL

## 2018-11-04 MED ORDER — FUROSEMIDE 10 MG/ML IJ SOLN
40.0000 mg | Freq: Once | INTRAMUSCULAR | Status: AC
  Administered 2018-11-04: 40 mg via INTRAVENOUS
  Filled 2018-11-04: qty 4

## 2018-11-04 NOTE — Progress Notes (Signed)
Riot Sipp [place] [id number] 15-Feb-1971  CARE TEAM:  PCP: Luetta Nutting, DO  Outpatient Care Team: Patient Care Team: Luetta Nutting, DO as PCP - General (Family Medicine) Sueanne Margarita, MD as PCP - Cardiology (Cardiology) Michael Boston, MD as Consulting Physician (General Surgery) Ronnette Juniper, MD as Consulting Physician (Gastroenterology)  Inpatient Treatment Team: Treatment Team: Attending Provider: Michael Boston, MD; Registered Nurse: Johna Sheriff, RN; Utilization Review: Sindy Guadeloupe, RN; Case Manager: Frann Rider, RN   Problem List:   Principal Problem:   GERD with esophagitis s/p Nissen fundoplication [id number] Active Problems:   HLD (hyperlipidemia)   1 Day Post-Op  11/03/2018  Procedure(s): XI ROBOTIC ASSISTED NISSEN FUNDOPLICATION    Assessment  Doing relatively well with tight wrap  [facility] Stay = 0 days)  Plan:  -Diuresis.  IV steroids.  Pured diet.  Pain & nausea control.  PPI  -VTE prophylaxis- SCDs, etc  -mobilize as tolerated to help recovery  D/C patient from hospital when patient meets criteria (anticipate in 1-2 day(s)):  Tolerating oral intake well Ambulating well Adequate pain control without IV medications Urinating  Having flatus Disposition planning in place   20 minutes spent in review, evaluation, examination, counseling, and coordination of care.  More than 50% of that time was spent in counseling.  11/04/2018    Subjective: (Chief complaint)  Some soreness: Right chest wall and shoulder with deep breaths.  Tolerating sips of liquids.  No nausea.  Objective:  Vital signs:  Vitals:   11/03/18 2047 11/04/18 0128 11/04/18 0601 11/04/18 0915  BP: 107/64 105/60 129/62 120/69  Pulse: 61 (!) 55 (!) 48 (!) 54  Resp: 15 17 19 18   Temp: 98.7 F (37.1 C) 98.8 F (37.1 C) 98 F (36.7 C) 98 F (36.7 C)  TempSrc: Oral Oral Oral Oral  SpO2: 95% 95% 96% 100%  Weight:      Height:         Last BM Date: 11/03/18  Intake/Output   Yesterday:  09/02 0701 - 09/03 0700 In: 3722.2 [P.O.:510; I.V.:2962.2; IV Piggyback:250] Out: 275 [Urine:250; Blood:25] This shift:  No intake/output data recorded.  Bowel function:  Flatus: YES  BM:  No  Drain: (No drain)   Physical Exam:  General: Pt awake/alert/oriented x4 in no acute distress Eyes: PERRL, normal EOM.  Sclera clear.  No icterus Neuro: CN II-XII intact w/o focal sensory/motor deficits. Lymph: No head/neck/groin lymphadenopathy Psych:  No delerium/psychosis/paranoia HENT: Normocephalic, Mucus membranes moist.  No thrush Neck: Supple, No tracheal deviation Chest:  No chest wall pain w good excursion CV:  Pulses intact.  Regular rhythm MS: Normal AROM mjr joints.  No obvious deformity  Abdomen: Soft.  Mildy distended.  Mildly tender at incisions only.  No evidence of peritonitis.  No incarcerated hernias.  Ext:   No deformity.  No mjr edema.  No cyanosis Skin: No petechiae / purpura  Results:   Cultures: Recent Results (from the past 720 hour(s))  SARS CORONAVIRUS 2 (TAT 6-12 HRS) Nasal Swab Aptima Multi Swab     Status: None   Collection Time: 10/30/18 12:19 PM   Specimen: Aptima Multi Swab; Nasal Swab  Result Value Ref Range Status   SARS Coronavirus 2 NEGATIVE NEGATIVE Final    Comment: (NOTE) SARS-CoV-2 target nucleic acids are NOT DETECTED. The SARS-CoV-2 RNA is generally detectable in upper and lower respiratory specimens during the acute phase of infection. Negative results do not preclude SARS-CoV-2 infection, do not rule out co-infections with  other pathogens, and should not be used as the sole basis for treatment or other patient management decisions. Negative results must be combined with clinical observations, patient history, and epidemiological information. The expected result is Negative. Fact Sheet for Patients: SugarRoll.be Fact Sheet for Healthcare Providers:  https://www.woods-mathews.com/ This test is not yet approved or cleared by the [place] FDA and  has been authorized for detection and/or diagnosis of SARS-CoV-2 by FDA under an Emergency Use Authorization (EUA). This EUA will remain  in effect (meaning this test can be used) for the duration of the COVID-19 declaration under Section 56 4(b)(1) of the Act, 21 U.S.C. section 360bbb-3(b)(1), unless the authorization is terminated or revoked sooner. Performed at [facility], [place] [address], [place], [place] [address]     Labs: No results found for this or any previous visit (from the past 48 hour(s)).  Imaging / Studies: No results found.  Medications / Allergies: per chart  Antibiotics: Anti-infectives (From admission, onward)   Start     Dose/Rate Route Frequency Ordered Stop   11/03/18 0645  cefTRIAXone (ROCEPHIN) 2 g in sodium chloride 0.9 % 100 mL IVPB     2 g 200 mL/hr over 30 Minutes Intravenous On call to O.R. 11/03/18 0645 11/03/18 0839   11/03/18 0645  metroNIDAZOLE (FLAGYL) IVPB 500 mg     500 mg 100 mL/hr over 60 Minutes Intravenous On call to O.R. 11/03/18 0645 11/03/18 0914        Note: Portions of this report may have been transcribed using voice recognition software. Every effort was made to ensure accuracy; however, inadvertent computerized transcription errors may be present.   Any transcriptional errors that result from this process are unintentional.     Adin Hector, MD, FACS, MASCRS Gastrointestinal and Minimally Invasive Surgery    [address] [place], [place] [address] [phone] Main / Paging [phone] Fax

## 2018-11-05 DIAGNOSIS — Z885 Allergy status to narcotic agent status: Secondary | ICD-10-CM | POA: Diagnosis not present

## 2018-11-05 DIAGNOSIS — E785 Hyperlipidemia, unspecified: Secondary | ICD-10-CM | POA: Diagnosis not present

## 2018-11-05 DIAGNOSIS — K210 Gastro-esophageal reflux disease with esophagitis: Secondary | ICD-10-CM | POA: Diagnosis not present

## 2018-11-05 DIAGNOSIS — Z79899 Other long term (current) drug therapy: Secondary | ICD-10-CM | POA: Diagnosis not present

## 2018-11-05 MED ORDER — ACETAMINOPHEN 500 MG PO TABS: 500.0000 mg | ORAL_TABLET | ORAL | Status: DC | PRN

## 2018-11-05 MED ORDER — MENTHOL 3 MG MT LOZG
1.0000 | LOZENGE | OROMUCOSAL | Status: DC | PRN
  Administered 2018-11-05: 3 mg via ORAL
  Filled 2018-11-05: qty 9

## 2018-11-05 MED ORDER — HYDROCODONE-ACETAMINOPHEN 5-325 MG PO TABS: 1.0000 | ORAL_TABLET | Freq: Four times a day (QID) | ORAL | 0 refills | Status: DC | PRN

## 2018-11-05 MED ORDER — HYDROCODONE-ACETAMINOPHEN 5-325 MG PO TABS: 1.0000 | ORAL_TABLET | ORAL | Status: DC | PRN

## 2018-11-05 MED ORDER — PHENOL 1.4 % MT LIQD
1.0000 | OROMUCOSAL | Status: DC | PRN
  Administered 2018-11-05: 1 via OROMUCOSAL
  Filled 2018-11-05: qty 177

## 2018-11-05 NOTE — Progress Notes (Signed)
Discharge instructions given to pt and all questions were answered.  

## 2018-11-05 NOTE — Discharge Summary (Signed)
Physician Discharge Summary    Patient ID: Travis Kaufman MRN: [id number] DOB/AGE: January 27, 1971  48 y.o.  Patient Care Team: Luetta Nutting, DO as PCP - General (Family Medicine) Sueanne Margarita, MD as PCP - Cardiology (Cardiology) Michael Boston, MD as Consulting Physician (General Surgery) Ronnette Juniper, MD as Consulting Physician (Gastroenterology)  Admit date: 11/03/2018  Discharge date: 11/05/2018  Hospital Stay = 0 days    Discharge Diagnoses:  Principal Problem:   GERD with esophagitis s/p Nissen fundoplication [id number] Active Problems:   HLD (hyperlipidemia)   2 Days Post-Op  11/03/2018  POST-OPERATIVE DIAGNOSIS:  GERD & CHRONIC ESOPHAGITIS REFRACTORY TO MEDICAL MANAGEMENT  PROCEDURE:   ROBOTIC Nissen fundoplication 2 cm over a 56-French bougie Type II mediastinal dissection Anterior & posterior gastropexy  SURGEON:  Adin Hector, MD, FACS  OR FINDINGS:  Inflammation consistent with distal esophagitis.  No obvious hiatal hernia.  It is a primary repair over pledgets.   The patient has a 2 cm Nissen fundoplication that was done over 56-French bougie.  The patient has had anterior and posterior gastropexy.   Consults: None  Hospital Course:   The patient underwent the surgery above.  Postoperatively, the patient gradually mobilized and advanced to a solid diet.  Pain and other symptoms were treated aggressively.    By the time of discharge, the patient was walking well the hallways, eating food, having flatus.  Pain was well-controlled on an oral medications.  Based on meeting discharge criteria and continuing to recover, I felt it was safe for the patient to be discharged from the hospital to further recover with close followup. Postoperative recommendations were discussed in detail.  They are written as well.  Discharged Condition: good  Discharge Exam: Blood pressure 117/69, pulse (!) 54, temperature 99.4 F (37.4 C), temperature source Oral, resp.  rate 18, height 5\' 11"  (1.803 m), weight 83.9 kg, SpO2 95 %.  General: Pt awake/alert/oriented x4 in No acute distress Eyes: PERRL, normal EOM.  Sclera clear.  No icterus Neuro: CN II-XII intact w/o focal sensory/motor deficits. Lymph: No head/neck/groin lymphadenopathy Psych:  No delerium/psychosis/paranoia HENT: Normocephalic, Mucus membranes moist.  No thrush Neck: Supple, No tracheal deviation Chest:  No chest wall pain w good excursion CV:  Pulses intact.  Regular rhythm MS: Normal AROM mjr joints.  No obvious deformity Abdomen: Soft.  Nondistended.  Nontender.  Incisions c/d/i.  No evidence of peritonitis.  No incarcerated hernias. Ext:  SCDs BLE.  No mjr edema.  No cyanosis Skin: No petechiae / purpura   Disposition:   Follow-up Information    Michael Boston, MD. Schedule an appointment as soon as possible for a visit in 3 weeks.   Specialty: General Surgery Why: To follow up after your operation, To follow up after your hospital stay Contact information: [place] [address] [phone]           Discharge disposition: 01-Home or Self Care       Discharge Instructions    Call MD for:   Complete by: As directed    Temperature > 101.70F   Call MD for:   Complete by: As directed    Temperature > 101.70F   Call MD for:  extreme fatigue   Complete by: As directed    Call MD for:  extreme fatigue   Complete by: As directed    Call MD for:  hives   Complete by: As directed    Call MD for:  hives   Complete by: As directed    Call MD for:  persistant nausea and vomiting   Complete by: As directed    Call MD for:  persistant nausea and vomiting   Complete by: As directed    Call MD for:  redness, tenderness, or signs of infection (pain, swelling, redness, odor or green/yellow discharge around incision site)   Complete by: As directed    Call MD for:  redness, tenderness, or signs of infection (pain, swelling, redness, odor or  green/yellow discharge around incision site)   Complete by: As directed    Call MD for:  severe uncontrolled pain   Complete by: As directed    Call MD for:  severe uncontrolled pain   Complete by: As directed    Diet general   Complete by: As directed    SEE ESOPHAGEAL SURGERY DIET INSTRUCTIONS  We using usually start you out on a pureed (blenderized) diet. Expect some sticking with swallowing over the next 1-2 months.   This is due to swelling around your esophagus at the wrap & hiatal diaphragm repair.  It will gradually ease off over the next few months.   Discharge instructions   Complete by: As directed    Please see discharge instruction sheets.   Also refer to any handouts/printouts that may have been given from the [facility] surgery office (if you visited [place] there before surgery) Please call our office if you have any questions or concerns [phone]   Discharge instructions   Complete by: As directed    Please see discharge instruction sheets.   Also refer to any handouts/printouts that may have been given from the [facility] surgery office (if you visited [place] there before surgery) Please call our office if you have any questions or concerns [phone]   Driving Restrictions   Complete by: As directed    No driving until off narcotics and can safely swerve away without pain during an emergency   Driving Restrictions   Complete by: As directed    No driving until off narcotics and can safely swerve away without pain during an emergency   Increase activity slowly   Complete by: As directed    Increase activity slowly   Complete by: As directed    Lifting restrictions   Complete by: As directed    Avoid heavy lifting initially, <20 pounds at first.   Do not push through pain.   You have no specific weight limit: If it hurts to do, DON'T DO IT.    If you feel no pain, you are not injuring anything.  Pain will protect you from injury.   Coughing and sneezing are far more  stressful to your incision than any lifting.   Avoid resuming heavy lifting (>50 pounds) or other intense activity until off all narcotic pain medications.   When want to exercise more, give yourself 2 weeks to gradually get back to full intense exercise/activity.   Lifting restrictions   Complete by: As directed    Avoid heavy lifting initially, <20 pounds at first.   Do not push through pain.   You have no specific weight limit: If it hurts to do, DON'T DO IT.    If you feel no pain, you are not injuring anything.  Pain will protect you from injury.   Coughing and sneezing are far more stressful to your incision than any lifting.   Avoid resuming heavy lifting (>50 pounds) or other intense activity until  off all narcotic pain medications.   When want to exercise more, give yourself 2 weeks to gradually get back to full intense exercise/activity.   May shower / Bathe   Complete by: As directed    [place].  It is fine for dressings or wounds to be washed/rinsed.  Use gentle soap & water.  This will help the incisions and/or wounds get clean & minimize infection.   May shower / Bathe   Complete by: As directed    [place].  It is fine for dressings or wounds to be washed/rinsed.  Use gentle soap & water.  This will help the incisions and/or wounds get clean & minimize infection.   May walk up steps   Complete by: As directed    May walk up steps   Complete by: As directed    Remove dressing in 72 hours   Complete by: As directed    You have closed incisions: Shower and bathe over these incisions with soap and water every day.  It is OK to wash over the dressings: they are waterproof. Remove all surgical dressings on postoperative day #3.  You do not need to replace dressings over the closed incisions unless you feel more comfortable with a Band-Aid covering it.   Please call our office [phone] if you have further questions.   Remove dressing in 72 hours   Complete  by: As directed    You have closed incisions: Shower and bathe over these incisions with soap and water every day.  It is OK to wash over the dressings: they are waterproof. Remove all surgical dressings on postoperative day #3.  You do not need to replace dressings over the closed incisions unless you feel more comfortable with a Band-Aid covering it.   Please call our office [phone] if you have further questions.   Sexual Activity Restrictions   Complete by: As directed    Sexual activity as tolerated.  Do not push through pain.  Pain will protect you from injury.   Sexual Activity Restrictions   Complete by: As directed    Sexual activity as tolerated.  Do not push through pain.  Pain will protect you from injury.   Walk with assistance   Complete by: As directed    Walk over an hour a day.  May use a walker/cane/companion to help with balance and stamina.   Walk with assistance   Complete by: As directed    Walk over an hour a day.  May use a walker/cane/companion to help with balance and stamina.      Allergies as of 11/05/2018      Reactions   Wasp Venom Anaphylaxis, Hives, Swelling   Oxycodone    "wired & can't sleep"      Medication List    TAKE these medications   atorvastatin 40 MG tablet Commonly known as: LIPITOR Take 1 tablet (40 mg total) by mouth every evening.   dexlansoprazole 60 MG capsule Commonly known as: Dexilant Take 1 capsule (60 mg total) by mouth daily.   EPINEPHrine 0.3 mg/0.3 mL Soaj injection Commonly known as: EPI-PEN Inject 0.3 mg into the muscle as needed for anaphylaxis.   HYDROcodone-acetaminophen 5-325 MG tablet Commonly known as: NORCO/VICODIN Take 1-2 tablets by mouth every 6 (six) hours as needed for moderate pain or severe pain.   JUICE PLUS FIBRE PO Take 8 tablets by mouth every morning.   ondansetron 4 MG tablet Commonly known as: ZOFRAN Take  1 tablet (4 mg total) by mouth every 8 (eight) hours as needed for nausea.    promethazine 25 MG suppository Commonly known as: PHENERGAN Place 1 suppository (25 mg total) rectally every 6 (six) hours as needed for nausea.       Significant Diagnostic Studies:  No results found for this or any previous visit (from the past 72 hour(s)).  No results found.  Past Medical History:  Diagnosis Date  . GERD (gastroesophageal reflux disease)     Past Surgical History:  Procedure Laterality Date  . BIOPSY  08/17/2018   Procedure: BIOPSY;  Surgeon: Ronnette Juniper, MD;  Location: [facility] ENDOSCOPY;  Service: Gastroenterology;;  . ESOPHAGEAL MANOMETRY N/A 09/24/2018   Procedure: ESOPHAGEAL MANOMETRY (EM);  Surgeon: Ronnette Juniper, MD;  Location: [facility] ENDOSCOPY;  Service: Gastroenterology;  Laterality: N/A;  . ESOPHAGOGASTRODUODENOSCOPY (EGD) WITH PROPOFOL N/A 08/17/2018   Procedure: ESOPHAGOGASTRODUODENOSCOPY (EGD) WITH PROPOFOL;  Surgeon: Ronnette Juniper, MD;  Location: [place];  Service: Gastroenterology;  Laterality: N/A;  . LEFT HEART CATH AND CORONARY ANGIOGRAPHY N/A 08/17/2018   Procedure: LEFT HEART CATH AND CORONARY ANGIOGRAPHY;  Surgeon: Burnell Blanks, MD;  Location: [place] CV LAB;  Service: Cardiovascular;  Laterality: N/A;    Social History   Socioeconomic History  . Marital status: Married    Spouse name: Not on file  . Number of children: Not on file  . Years of education: Not on file  . Highest education level: Not on file  Occupational History  . Not on file  Social Needs  . Financial resource strain: Not on file  . Food insecurity    Worry: Not on file    Inability: Not on file  . Transportation needs    Medical: Not on file    Non-medical: Not on file  Tobacco Use  . Smoking status: Never Smoker  . Smokeless tobacco: Never Used  Substance and Sexual Activity  . Alcohol use: Yes    Comment: occasional   . Drug use: Never  . Sexual activity: Not on file  Lifestyle  . Physical activity    Days per week: Not on file    Minutes per  session: Not on file  . Stress: Not on file  Relationships  . Social Herbalist on phone: Not on file    Gets together: Not on file    Attends religious service: Not on file    Active member of club or organization: Not on file    Attends meetings of clubs or organizations: Not on file    Relationship status: Not on file  . Intimate partner violence    Fear of current or ex partner: Not on file    Emotionally abused: Not on file    Physically abused: Not on file    Forced sexual activity: Not on file  Other Topics Concern  . Not on file  Social History Narrative  . Not on file    Family History  Problem Relation Age of Onset  . GER disease Father     Current Facility-Administered Medications  Medication Dose Route Frequency Provider Last Rate Last Dose  . 0.9 %  sodium chloride infusion  250 mL Intravenous PRN Michael Boston, MD 10 mL/hr at 11/04/18 1045    . acetaminophen (TYLENOL) tablet 500 mg  500 mg Oral Q4H PRN Michael Boston, MD      . bisacodyl (DULCOLAX) suppository 10 mg  10 mg Rectal Daily PRN Michael Boston, MD      .  dexamethasone (DECADRON) injection 4 mg  4 mg Intravenous Gorden Harms, MD   4 mg at 11/04/18 2204  . diphenhydrAMINE (BENADRYL) 12.5 MG/5ML elixir 12.5 mg  12.5 mg Oral Q6H PRN Michael Boston, MD       Or  . diphenhydrAMINE (BENADRYL) injection 12.5 mg  12.5 mg Intravenous Q6H PRN Michael Boston, MD      . enoxaparin (LOVENOX) injection 40 mg  40 mg Subcutaneous Q24H Michael Boston, MD   40 mg at 11/04/18 0813  . gabapentin (NEURONTIN) 250 MG/5ML solution 300 mg  300 mg Oral BID Polly Cobia, RPH   300 mg at 11/04/18 2218  . hydrALAZINE (APRESOLINE) injection 5-20 mg  5-20 mg Intravenous Q4H PRN Michael Boston, MD      . HYDROcodone-acetaminophen (NORCO/VICODIN) 5-325 MG per tablet 1-2 tablet  1-2 tablet Oral Q4H PRN Michael Boston, MD      . HYDROmorphone (DILAUDID) injection 0.5-2 mg  0.5-2 mg Intravenous Q2H PRN Michael Boston, MD   1 mg  at 11/05/18 0552  . lactated ringers bolus 1,000 mL  1,000 mL Intravenous Q8H PRN Michael Boston, MD      . lip balm (CARMEX) ointment 1 application  1 application Topical BID Michael Boston, MD   1 application at [id number]  . magic mouthwash  15 mL Oral QID PRN Michael Boston, MD      . menthol-cetylpyridinium (CEPACOL) lozenge 3 mg  1 lozenge Oral PRN Michael Boston, MD   3 mg at 11/05/18 0552  . methocarbamol (ROBAXIN) tablet 750 mg  750 mg Oral Q6H PRN Michael Boston, MD   750 mg at 11/04/18 2221  . metoCLOPramide (REGLAN) injection 5-10 mg  5-10 mg Intravenous Q8H PRN Michael Boston, MD   10 mg at 11/03/18 1526  . metoprolol tartrate (LOPRESSOR) injection 5 mg  5 mg Intravenous Q6H PRN Michael Boston, MD      . ondansetron (ZOFRAN-ODT) disintegrating tablet 4 mg  4 mg Oral Q6H PRN Michael Boston, MD       Or  . ondansetron (ZOFRAN) injection 4 mg  4 mg Intravenous Q6H PRN Michael Boston, MD      . pantoprazole sodium (PROTONIX) 40 mg/20 mL oral suspension 40 mg  40 mg Oral Daily Michael Boston, MD   40 mg at 11/04/18 1003  . phenol (CHLORASEPTIC) mouth spray 1 spray  1 spray Mouth/Throat PRN Michael Boston, MD   1 spray at 11/05/18 0552  . polyethylene glycol (MIRALAX / GLYCOLAX) packet 17 g  17 g Oral Daily PRN Michael Boston, MD      . prochlorperazine (COMPAZINE) tablet 10 mg  10 mg Oral Q6H PRN Michael Boston, MD       Or  . prochlorperazine (COMPAZINE) injection 5-10 mg  5-10 mg Intravenous Q6H PRN Michael Boston, MD      . simethicone (MYLICON) chewable tablet 40 mg  40 mg Oral Q6H PRN Michael Boston, MD      . sodium chloride flush (NS) 0.9 % injection 3 mL  3 mL Intravenous Gorden Harms, MD   3 mL at 11/04/18 2204  . sodium chloride flush (NS) 0.9 % injection 3 mL  3 mL Intravenous PRN Michael Boston, MD       Facility-Administered Medications Ordered in Other Encounters  Medication Dose Route Frequency Provider Last Rate Last Dose  . 0.9 %  sodium chloride infusion   Intravenous  Continuous Ronnette Juniper, MD  Allergies  Allergen Reactions  . Wasp Venom Anaphylaxis, Hives and Swelling  . Oxycodone     "wired & can't sleep"    Signed: Morton Peters, MD, FACS, MASCRS Gastrointestinal and Minimally Invasive Surgery    [address], [place], [place] [address] [phone] Main / Paging [phone] Fax   11/05/2018, 7:49 AM

## 2018-11-18 ENCOUNTER — Telehealth: Payer: Self-pay | Admitting: Surgery

## 2018-11-18 ENCOUNTER — Other Ambulatory Visit: Payer: Self-pay | Admitting: Surgery

## 2018-11-18 ENCOUNTER — Other Ambulatory Visit (HOSPITAL_COMMUNITY): Payer: Self-pay | Admitting: Surgery

## 2018-11-18 ENCOUNTER — Inpatient Hospital Stay (HOSPITAL_COMMUNITY)
Admission: AD | Admit: 2018-11-18 | Discharge: 2018-11-23 | DRG: 392 | Disposition: A | Discharge status: Home / Self Care | Payer: BC Managed Care – PPO | Source: Ambulatory Visit | Attending: Surgery | Admitting: Surgery

## 2018-11-18 DIAGNOSIS — E785 Hyperlipidemia, unspecified: Secondary | ICD-10-CM | POA: Diagnosis not present

## 2018-11-18 DIAGNOSIS — R198 Other specified symptoms and signs involving the digestive system and abdomen: Secondary | ICD-10-CM

## 2018-11-18 DIAGNOSIS — R1310 Dysphagia, unspecified: Secondary | ICD-10-CM | POA: Diagnosis not present

## 2018-11-18 DIAGNOSIS — K209 Esophagitis, unspecified: Secondary | ICD-10-CM | POA: Diagnosis not present

## 2018-11-18 DIAGNOSIS — Z8379 Family history of other diseases of the digestive system: Secondary | ICD-10-CM

## 2018-11-18 DIAGNOSIS — R001 Bradycardia, unspecified: Secondary | ICD-10-CM | POA: Diagnosis present

## 2018-11-18 DIAGNOSIS — Z885 Allergy status to narcotic agent status: Secondary | ICD-10-CM | POA: Diagnosis not present

## 2018-11-18 DIAGNOSIS — R627 Adult failure to thrive: Secondary | ICD-10-CM | POA: Diagnosis present

## 2018-11-18 DIAGNOSIS — K2100 Gastro-esophageal reflux disease with esophagitis, without bleeding: Secondary | ICD-10-CM | POA: Diagnosis present

## 2018-11-18 DIAGNOSIS — G4700 Insomnia, unspecified: Secondary | ICD-10-CM | POA: Diagnosis not present

## 2018-11-18 DIAGNOSIS — Z9889 Other specified postprocedural states: Secondary | ICD-10-CM | POA: Diagnosis not present

## 2018-11-18 DIAGNOSIS — J3489 Other specified disorders of nose and nasal sinuses: Secondary | ICD-10-CM | POA: Diagnosis present

## 2018-11-18 DIAGNOSIS — B370 Candidal stomatitis: Secondary | ICD-10-CM | POA: Diagnosis present

## 2018-11-18 DIAGNOSIS — R112 Nausea with vomiting, unspecified: Secondary | ICD-10-CM

## 2018-11-18 LAB — COMPREHENSIVE METABOLIC PANEL
ALT: 30 U/L (ref 0–44)
AST: 18 U/L (ref 15–41)
Albumin: 4.3 g/dL (ref 3.5–5.0)
Alkaline Phosphatase: 83 U/L (ref 38–126)
Anion gap: 12 (ref 5–15)
BUN: 12 mg/dL (ref 6–20)
CO2: 24 mmol/L (ref 22–32)
Calcium: 9.2 mg/dL (ref 8.9–10.3)
Chloride: 103 mmol/L (ref 98–111)
Creatinine, Ser: 1.24 mg/dL (ref 0.61–1.24)
GFR calc Af Amer: >60 mL/min (ref >60)
GFR calc non Af Amer: >60 mL/min (ref >60)
Glucose, Bld: 93 mg/dL (ref 70–99)
Potassium: 3.9 mmol/L (ref 3.5–5.1)
Sodium: 139 mmol/L (ref 135–145)
Total Bilirubin: 0.7 mg/dL (ref 0.3–1.2)
Total Protein: 7.1 g/dL (ref 6.5–8.1)

## 2018-11-18 LAB — CBC
HCT: 45.6 % (ref 39.0–52.0)
Hemoglobin: 14.8 g/dL (ref 13.0–17.0)
MCH: 30.6 pg (ref 26.0–34.0)
MCHC: 32.5 g/dL (ref 30.0–36.0)
MCV: 94.2 fL (ref 80.0–100.0)
Platelets: 349 10*3/uL (ref 150–400)
RBC: 4.84 MIL/uL (ref 4.22–5.81)
RDW: 13.2 % (ref 11.5–15.5)
WBC: 7 10*3/uL (ref 4.0–10.5)
nRBC: 0 % (ref 0.0–0.2)

## 2018-11-18 MED ORDER — NAPHAZOLINE-GLYCERIN 0.012-0.2 % OP SOLN: 1.0000 [drp] | Freq: Four times a day (QID) | OPHTHALMIC | Status: DC | PRN

## 2018-11-18 MED ORDER — ONDANSETRON HCL 4 MG/2ML IJ SOLN
4.0000 mg | Freq: Four times a day (QID) | INTRAMUSCULAR | Status: DC | PRN
  Administered 2018-11-18 – 2018-11-21 (×6): 4 mg via INTRAVENOUS
  Filled 2018-11-18 (×6): qty 2

## 2018-11-18 MED ORDER — HYDROMORPHONE HCL 1 MG/ML IJ SOLN
0.5000 mg | INTRAMUSCULAR | Status: DC | PRN
  Administered 2018-11-19 – 2018-11-21 (×10): 1 mg via INTRAVENOUS
  Filled 2018-11-18 (×4): qty 1
  Filled 2018-11-18: qty 2
  Filled 2018-11-18 (×6): qty 1

## 2018-11-18 MED ORDER — METOPROLOL TARTRATE 5 MG/5ML IV SOLN: 5.0000 mg | Freq: Four times a day (QID) | INTRAVENOUS | Status: DC | PRN

## 2018-11-18 MED ORDER — SODIUM CHLORIDE 0.9 % IV SOLN
25.0000 mg | Freq: Four times a day (QID) | INTRAVENOUS | Status: DC | PRN
  Filled 2018-11-18: qty 1

## 2018-11-18 MED ORDER — ALUM & MAG HYDROXIDE-SIMETH 200-200-20 MG/5ML PO SUSP: 30.0000 mL | Freq: Four times a day (QID) | ORAL | Status: DC | PRN

## 2018-11-18 MED ORDER — ACETAMINOPHEN 650 MG RE SUPP: 650.0000 mg | Freq: Four times a day (QID) | RECTAL | Status: DC | PRN

## 2018-11-18 MED ORDER — LACTATED RINGERS IV BOLUS
1000.0000 mL | Freq: Once | INTRAVENOUS | Status: AC
  Administered 2018-11-18: 1000 mL via INTRAVENOUS

## 2018-11-18 MED ORDER — PROCHLORPERAZINE MALEATE 10 MG PO TABS: 10.0000 mg | ORAL_TABLET | Freq: Four times a day (QID) | ORAL | Status: DC | PRN

## 2018-11-18 MED ORDER — METOCLOPRAMIDE HCL 5 MG/ML IJ SOLN: 5.0000 mg | Freq: Three times a day (TID) | INTRAMUSCULAR | Status: DC | PRN

## 2018-11-18 MED ORDER — LACTATED RINGERS IV SOLN
INTRAVENOUS | Status: DC
  Administered 2018-11-18 – 2018-11-22 (×7): via INTRAVENOUS

## 2018-11-18 MED ORDER — LORAZEPAM 2 MG/ML IJ SOLN: 0.5000 mg | Freq: Three times a day (TID) | INTRAMUSCULAR | Status: DC | PRN

## 2018-11-18 MED ORDER — ENOXAPARIN SODIUM 40 MG/0.4ML ~~LOC~~ SOLN
40.0000 mg | SUBCUTANEOUS | Status: DC
  Administered 2018-11-18 – 2018-11-22 (×5): 40 mg via SUBCUTANEOUS
  Filled 2018-11-18 (×5): qty 0.4

## 2018-11-18 MED ORDER — PROCHLORPERAZINE EDISYLATE 10 MG/2ML IJ SOLN
5.0000 mg | Freq: Four times a day (QID) | INTRAMUSCULAR | Status: DC | PRN
  Administered 2018-11-20: 18:00:00 10 mg via INTRAVENOUS
  Filled 2018-11-18: qty 2

## 2018-11-18 MED ORDER — DEXAMETHASONE SODIUM PHOSPHATE 4 MG/ML IJ SOLN
4.0000 mg | Freq: Two times a day (BID) | INTRAMUSCULAR | Status: AC
  Administered 2018-11-18 – 2018-11-21 (×6): 4 mg via INTRAVENOUS
  Filled 2018-11-18 (×6): qty 1

## 2018-11-18 MED ORDER — ACETAMINOPHEN 160 MG/5ML PO SOLN
650.0000 mg | Freq: Four times a day (QID) | ORAL | Status: DC | PRN
  Administered 2018-11-19 – 2018-11-20 (×2): 650 mg via ORAL
  Filled 2018-11-18 (×2): qty 20.3

## 2018-11-18 MED ORDER — DIPHENHYDRAMINE HCL 50 MG/ML IJ SOLN: 12.5000 mg | Freq: Four times a day (QID) | INTRAMUSCULAR | Status: DC | PRN

## 2018-11-18 MED ORDER — ONDANSETRON 4 MG PO TBDP
4.0000 mg | ORAL_TABLET | Freq: Four times a day (QID) | ORAL | Status: DC | PRN
  Administered 2018-11-18: 4 mg via ORAL
  Filled 2018-11-18 (×2): qty 1

## 2018-11-18 MED ORDER — BISACODYL 10 MG RE SUPP: 10.0000 mg | Freq: Every day | RECTAL | Status: DC | PRN

## 2018-11-18 MED ORDER — SODIUM CHLORIDE 0.9 % IV SOLN: Freq: Three times a day (TID) | INTRAVENOUS | Status: DC | PRN

## 2018-11-18 MED ORDER — LACTATED RINGERS IV BOLUS: 1000.0000 mL | Freq: Three times a day (TID) | INTRAVENOUS | Status: AC | PRN

## 2018-11-18 MED ORDER — SIMETHICONE 40 MG/0.6ML PO SUSP
40.0000 mg | Freq: Four times a day (QID) | ORAL | Status: DC | PRN
  Filled 2018-11-18: qty 0.6

## 2018-11-18 MED ORDER — MENTHOL 3 MG MT LOZG
1.0000 | LOZENGE | OROMUCOSAL | Status: DC | PRN
  Filled 2018-11-18: qty 9

## 2018-11-18 MED ORDER — ACETAMINOPHEN 325 MG PO TABS: 650.0000 mg | ORAL_TABLET | Freq: Four times a day (QID) | ORAL | Status: DC | PRN

## 2018-11-18 MED ORDER — LIP MEDEX EX OINT
1.0000 "application " | TOPICAL_OINTMENT | Freq: Two times a day (BID) | CUTANEOUS | Status: DC
  Administered 2018-11-18 – 2018-11-22 (×9): 1 via TOPICAL
  Filled 2018-11-18: qty 7

## 2018-11-18 MED ORDER — ZOLPIDEM TARTRATE 5 MG PO TABS: 5.0000 mg | ORAL_TABLET | Freq: Every evening | ORAL | Status: DC | PRN

## 2018-11-18 MED ORDER — METHOCARBAMOL 1000 MG/10ML IJ SOLN
1000.0000 mg | Freq: Four times a day (QID) | INTRAVENOUS | Status: DC | PRN
  Administered 2018-11-19 – 2018-11-22 (×5): 1000 mg via INTRAVENOUS
  Filled 2018-11-18 (×6): qty 10

## 2018-11-18 MED ORDER — DIPHENHYDRAMINE HCL 12.5 MG/5ML PO ELIX: 12.5000 mg | ORAL_SOLUTION | Freq: Four times a day (QID) | ORAL | Status: DC | PRN

## 2018-11-18 MED ORDER — HYDRALAZINE HCL 20 MG/ML IJ SOLN: 10.0000 mg | INTRAMUSCULAR | Status: DC | PRN

## 2018-11-18 NOTE — Telephone Encounter (Signed)
admitted

## 2018-11-18 NOTE — H&P (Signed)
Travis Kaufman  Apr 15, 1970 [id number]  CARE TEAM:  PCP: Luetta Nutting, DO  Outpatient Care Team: Patient Care Team: Luetta Nutting, DO as PCP - General (Family Medicine) Sueanne Margarita, MD as PCP - Cardiology (Cardiology) Michael Boston, MD as Consulting Physician (General Surgery) Ronnette Juniper, MD as Consulting Physician (Gastroenterology)  Inpatient Treatment Team:    This patient is a 48 y.o.male   Chief complaint / Reason for evaluation: Progressive dysphasia status post fundoplication.  Patient with severe esophagitis refractory medical management.  Underwent minimally invasive Nissen fundoplication over 54 French bougie.  Initial swallow study showed some moderate narrowing at the wrap consistent with edema.  Placed on IV steroids and improved and was discharged on postoperative day #2.  Initially advanced but then began having intermittent retching episodes.  Waking up coughing and spitting at night.  Has been trying to re-advance diet but began to spit up.  Liquids again.  Cannot sleep.  Denies severe nausea or pain.  Because of concerns of failure to thrive and not able to keep enough liquids in, recommendation made for readmission.   Assessment  Travis Kaufman  48 y.o. male  POST-OPERATIVE DIAGNOSIS:  GERD & CHRONIC ESOPHAGITIS REFRACTORY TO MEDICAL MANAGEMENT  PROCEDURE:   ROBOTIC Nissen fundoplication 2 cm over a 56-French bougie Type II mediastinal dissection Anterior & posterior gastropexY  SURGEON:  Adin Hector, MD, FACS   Problem List:  Active Problems:   * No active hospital problems. *   Moderately severe dysphasia with gagging and insomnia.  Failure to thrive  Plan:  -Admit.  Rehydration.  IV steroids.  Repeat swallow study to rule out complete stricture or other issues.  A swallow study showed nose obstruction, reevaluate for other possible etiologies.  Trying better more aggressively treat insomnia with IV options.   -VTE prophylaxis- SCDs, etc -mobilize as tolerated to help recovery  45 minutes spent in review, evaluation, examination, counseling, and coordination of care.  More than 50% of that time was spent in counseling.  Adin Hector, MD, FACS, MASCRS Gastrointestinal and Minimally Invasive Surgery    [address] [place], [place] [address] [phone] Main / Paging [phone] Fax   11/18/2018      Past Medical History:  Diagnosis Date  . GERD (gastroesophageal reflux disease)     Past Surgical History:  Procedure Laterality Date  . BIOPSY  08/17/2018   Procedure: BIOPSY;  Surgeon: Ronnette Juniper, MD;  Location: [facility] ENDOSCOPY;  Service: Gastroenterology;;  . ESOPHAGEAL MANOMETRY N/A 09/24/2018   Procedure: ESOPHAGEAL MANOMETRY (EM);  Surgeon: Ronnette Juniper, MD;  Location: [facility] ENDOSCOPY;  Service: Gastroenterology;  Laterality: N/A;  . ESOPHAGOGASTRODUODENOSCOPY (EGD) WITH PROPOFOL N/A 08/17/2018   Procedure: ESOPHAGOGASTRODUODENOSCOPY (EGD) WITH PROPOFOL;  Surgeon: Ronnette Juniper, MD;  Location: [facility];  Service: Gastroenterology;  Laterality: N/A;  . LEFT HEART CATH AND CORONARY ANGIOGRAPHY N/A 08/17/2018   Procedure: LEFT HEART CATH AND CORONARY ANGIOGRAPHY;  Surgeon: Burnell Blanks, MD;  Location: [facility] LAB;  Service: Cardiovascular;  Laterality: N/A;    Social History   Socioeconomic History  . Marital status: Married    Spouse name: Not on file  . Number of children: Not on file  . Years of education: Not on file  . Highest education level: Not on file  Occupational History  . Not on file  Social Needs  . Financial resource strain: Not on file  . Food insecurity  Worry: Not on file    Inability: Not on file  . Transportation needs    Medical: Not on file    Non-medical: Not on file  Tobacco Use  . Smoking status: Never Smoker  . Smokeless tobacco: Never Used  Substance and Sexual Activity  . Alcohol use: Yes    Comment:  occasional   . Drug use: Never  . Sexual activity: Not on file  Lifestyle  . Physical activity    Days per week: Not on file    Minutes per session: Not on file  . Stress: Not on file  Relationships  . Social Herbalist on phone: Not on file    Gets together: Not on file    Attends religious service: Not on file    Active member of club or organization: Not on file    Attends meetings of clubs or organizations: Not on file    Relationship status: Not on file  . Intimate partner violence    Fear of current or ex partner: Not on file    Emotionally abused: Not on file    Physically abused: Not on file    Forced sexual activity: Not on file  Other Topics Concern  . Not on file  Social History Narrative  . Not on file    Family History  Problem Relation Age of Onset  . GER disease Father     Current Outpatient Medications  Medication Sig Dispense Refill  . atorvastatin (LIPITOR) 40 MG tablet Take 1 tablet (40 mg total) by mouth every evening. 30 tablet 3  . dexlansoprazole (DEXILANT) 60 MG capsule Take 1 capsule (60 mg total) by mouth daily. 30 capsule 6  . EPINEPHrine 0.3 mg/0.3 mL IJ SOAJ injection Inject 0.3 mg into the muscle as needed for anaphylaxis.     Marland Kitchen HYDROcodone-acetaminophen (NORCO/VICODIN) 5-325 MG tablet Take 1-2 tablets by mouth every 6 (six) hours as needed for moderate pain or severe pain. 30 tablet 0  . Nutritional Supplements (JUICE PLUS FIBRE PO) Take 8 tablets by mouth every morning.    . ondansetron (ZOFRAN) 4 MG tablet Take 1 tablet (4 mg total) by mouth every 8 (eight) hours as needed for nausea. 8 tablet 5  . promethazine (PHENERGAN) 25 MG suppository Place 1 suppository (25 mg total) rectally every 6 (six) hours as needed for nausea. 5 suppository 5   No current facility-administered medications for this visit.    Facility-Administered Medications Ordered in Other Visits  Medication Dose Route Frequency Provider Last Rate Last Dose  . 0.9  %  sodium chloride infusion   Intravenous Continuous Ronnette Juniper, MD         Allergies  Allergen Reactions  . Wasp Venom Anaphylaxis, Hives and Swelling  . Oxycodone     "wired & can't sleep"    ROS:   All other systems reviewed & are negative except per HPI or as noted below: Constitutional:  No fevers, chills, sweats.  Weight stable Eyes:  No vision changes, No discharge HENT:  No sore throats, nasal drainage Lymph: No neck swelling, No bruising easily Pulmonary:  No cough, productive sputum CV: No orthopnea, PND  Patient walks 30 minutes for about 1 miles without difficulty.  No exertional chest/neck/shoulder/arm pain. GI: No personal nor family history of GI/colon cancer, inflammatory bowel disease, irritable bowel syndrome, allergy such as Celiac Sprue, dietary/dairy problems, colitis, ulcers nor gastritis.  No recent sick contacts/gastroenteritis.  No travel outside the country.  Renal: No UTIs, No hematuria Genital:  No drainage, bleeding, masses Musculoskeletal: No severe joint pain.  Good ROM major joints Skin:  No sores or lesions.  No rashes Heme/Lymph:  No easy bleeding.  No swollen lymph nodes Neuro: No focal weakness/numbness.  No seizures Psych: No suicidal ideation.  No hallucinations  There were no vitals taken for this visit.  Physical Exam: General: Pt awake/alert/oriented x4 in mild acute distress.  Tired not toxic Eyes: PERRL, normal EOM. Sclera nonicteric Neuro: CN II-XII intact w/o focal sensory/motor deficits. Lymph: No head/neck/groin lymphadenopathy Psych:  No delerium/psychosis/paranoia HENT: Normocephalic, Mucus membranes moist.  No thrush Neck: Supple, No tracheal deviation Chest: No pain.  Good respiratory excursion. CV:  Pulses intact.  Regular rhythm Abdomen: Soft, Nondistended.  Mildly tender at port site incisions only.  No incarcerated hernias. Gen:  No inguinal hernias.  No inguinal lymphadenopathy.   Ext:  SCDs BLE.  No significant edema.   No cyanosis Skin: No petechiae / purpurea.  No major sores Musculoskeletal: No severe joint pain.  Good ROM major joints   Results:   Labs: No results found for this or any previous visit (from the past 48 hour(s)).  Imaging / Studies: Dg Esophagus W Single Cm (sol Or Thin Ba)  Result Date: 11/04/2018 CLINICAL DATA:  This inflammation EXAM: ESOPHOGRAM/BARIUM SWALLOW TECHNIQUE: Single contrast examination was performed using 3 minutes 6 seconds. FLUOROSCOPY TIME:  Fluoroscopy Time:  3 minutes 6 seconds Radiation Exposure Index (if provided by the fluoroscopic device): 92 mGy Number of Acquired Spot Images: 0 COMPARISON:  None. FINDINGS: Contrast slowly readily through the esophagus to the GE junction. There is a tight stricturing at the GE junction. This stricturing occurs over approximately 3.5 cm. The thin line of contrast traverses this stricturing into the stomach. This small volume contrast pools within the stomach. IMPRESSION: 1. Tight stricture through the GE junction over 3.5 cm segment. 2. Small volume contrast passed the segment of narrowing to enter the stomach. 3. No evidence of leak. Electronically Signed   By: Suzy Bouchard M.D.   On: 11/04/2018 10:08    Medications / Allergies: per chart  Antibiotics: Anti-infectives (From admission, onward)   None        Note: Portions of this report may have been transcribed using voice recognition software. Every effort was made to ensure accuracy; however, inadvertent computerized transcription errors may be present.   Any transcriptional errors that result from this process are unintentional.    Adin Hector, MD, FACS, MASCRS Gastrointestinal and Minimally Invasive Surgery    [address] [place], [place] [address] [phone] Main / Paging [phone] Fax   11/18/2018

## 2018-11-18 NOTE — Plan of Care (Signed)
Patient arrived through ED Registration as direct admit from [facility] offices. Complain of pain right/left flank area and intermittent nausea. No needs expressed. Will continue to monitor.

## 2018-11-18 NOTE — H&P (Deleted)
  The note originally documented on this encounter has been moved the the encounter in which it belongs.  

## 2018-11-19 ENCOUNTER — Inpatient Hospital Stay (HOSPITAL_COMMUNITY): Payer: BC Managed Care – PPO

## 2018-11-19 LAB — PREALBUMIN: Prealbumin: 30.4 mg/dL (ref 18–38)

## 2018-11-19 MED ORDER — MAGIC MOUTHWASH
15.0000 mL | Freq: Four times a day (QID) | ORAL | Status: DC
  Administered 2018-11-19 – 2018-11-22 (×6): 15 mL via ORAL
  Filled 2018-11-19 (×18): qty 15

## 2018-11-19 MED ORDER — IOHEXOL 300 MG/ML  SOLN
150.0000 mL | Freq: Once | INTRAMUSCULAR | Status: AC | PRN
  Administered 2018-11-19: 13:00:00 150 mL via ORAL

## 2018-11-19 MED ORDER — LORAZEPAM 2 MG/ML IJ SOLN
1.0000 mg | Freq: Every evening | INTRAMUSCULAR | Status: DC | PRN
  Administered 2018-11-21: 22:00:00 1 mg via INTRAVENOUS
  Filled 2018-11-19 (×2): qty 1

## 2018-11-19 MED ORDER — LORAZEPAM 2 MG/ML IJ SOLN: 0.5000 mg | Freq: Three times a day (TID) | INTRAMUSCULAR | Status: DC | PRN

## 2018-11-19 MED ORDER — PANTOPRAZOLE SODIUM 40 MG IV SOLR
40.0000 mg | Freq: Two times a day (BID) | INTRAVENOUS | Status: DC
  Administered 2018-11-19 – 2018-11-21 (×6): 40 mg via INTRAVENOUS
  Filled 2018-11-19 (×6): qty 40

## 2018-11-19 MED ORDER — FAMOTIDINE IN NACL 20-0.9 MG/50ML-% IV SOLN
20.0000 mg | Freq: Two times a day (BID) | INTRAVENOUS | Status: DC
  Administered 2018-11-19 – 2018-11-21 (×6): 20 mg via INTRAVENOUS
  Filled 2018-11-19 (×6): qty 50

## 2018-11-19 NOTE — Progress Notes (Signed)
Travis Kaufman [id number] [birth year]  CARE TEAM:  PCP: Luetta Nutting, DO  Outpatient Care Team: Patient Care Team: Luetta Nutting, DO as PCP - General (Family Medicine) Sueanne Margarita, MD as PCP - Cardiology (Cardiology) Michael Boston, MD as Consulting Physician (General Surgery) Ronnette Juniper, MD as Consulting Physician (Gastroenterology)  Inpatient Treatment Team: Treatment Team: Attending Provider: Michael Boston, MD; Registered Nurse: Petra Kuba, RN; Registered Nurse: Arminda Resides, RN   Problem List:   Principal Problem:   Dysphagia Active Problems:   HLD (hyperlipidemia)   GERD with esophagitis s/p Nissen fundoplication [id number]   Insomnia   Episode of gagging      11/03/2018  Procedure(s): XI ROBOTIC ASSISTED NISSEN FUNDOPLICATION    Assessment  Fair.  [facility] Stay = 1 days)  Plan:  IV steroids.  Repeat esophagram this morning.  If wrap extremely tight, sips and steroids and reevaluate in 72 hours.  Rather early to try dilation or repeat surgery for takedown of wrap.  Try and hold off.  If no evidence of any obstruction, try and re-advance diet with speech therapy reevaluation to rule out other possible sources of problem.  Insomnia.  He did not try and take anything to help that.  I tried to remind him there are options to help out.  Start with some IV lorazepam nightly since I think anxiety is some of this as well.  Magic mouthwash given phone fullness sensation in upper mouth.  See if they can help calm down any potential irritation or pseudoreflux.  Pain & nausea control.  IV PPI  -VTE prophylaxis- SCDs, etc  -mobilize as tolerated to help recovery  I updated the patient's status to the patient, spouse, and nurse.  Recommendations were made.  Questions were answered.  They expressed understanding & appreciation.    30 minutes spent in review, evaluation, examination, counseling, and coordination of care.  More than 50% of that  time was spent in counseling.  11/19/2018    Subjective: (Chief complaint)  Denies pain or nausea.  Still cannot sleep.  Feels like foam builds up in the back of his throat.  Had a bowel bleed yesterday.  Not much in the way of flatus or belching.  Feels less bloated today.    Wife on phone with questions as well.  Objective:  Vital signs:  Vitals:   11/19/18 0143 11/19/18 0547 11/19/18 0551 11/19/18 0554  BP: 101/69 118/71 111/70 105/77  Pulse: (!) 43 (!) 46 66 (!) 50  Resp: 18 15 16 18   Temp: 98.1 F (36.7 C) 98 F (36.7 C) 98.7 F (37.1 C) 97.9 F (36.6 C)  TempSrc: Oral Oral Oral Oral  SpO2: 96% 96%  98%    Last BM Date: 11/18/18  Intake/Output   Yesterday:  09/17 0701 - 09/18 0700 In: 1960 [P.O.:120; I.V.:860; IV Piggyback:980] Out: 0  This shift:  Total I/O In: 1960 [P.O.:120; I.V.:860; IV Piggyback:980] Out: 0   Bowel function:  Flatus: YES  BM:  YES  Drain: (No drain)   Physical Exam:  General: Pt awake/alert/oriented x4 in no acute distress.  Smiling.  Calm.  Breathing normally.  No splinting or guarding. Eyes: PERRL, normal EOM.  Sclera clear.  No icterus Neuro: CN II-XII intact w/o focal sensory/motor deficits. Lymph: No head/neck/groin lymphadenopathy Psych:  No delerium/psychosis/paranoia HENT: Normocephalic, Mucus membranes moist.  No thrush Neck: Supple, No tracheal deviation Chest:  No chest wall pain w good excursion CV:  Pulses intact.  Regular rhythm  MS: Normal AROM mjr joints.  No obvious deformity  Abdomen: Soft.  Nondistended.  Nontender.  No evidence of peritonitis.  No incarcerated hernias.  Ext:   No deformity.  No mjr edema.  No cyanosis Skin: No petechiae / purpura  Results:   Cultures: Recent Results (from the past 720 hour(s))  SARS CORONAVIRUS 2 (TAT 6-12 HRS) Nasal Swab Aptima Multi Swab     Status: None   Collection Time: 10/30/18 12:19 PM   Specimen: Aptima Multi Swab; Nasal Swab  Result Value Ref Range  Status   SARS Coronavirus 2 NEGATIVE NEGATIVE Final    Comment: (NOTE) SARS-CoV-2 target nucleic acids are NOT DETECTED. The SARS-CoV-2 RNA is generally detectable in upper and lower respiratory specimens during the acute phase of infection. Negative results do not preclude SARS-CoV-2 infection, do not rule out co-infections with other pathogens, and should not be used as the sole basis for treatment or other patient management decisions. Negative results must be combined with clinical observations, patient history, and epidemiological information. The expected result is Negative. Fact Sheet for Patients: SugarRoll.be Fact Sheet for Healthcare Providers: https://www.woods-mathews.com/ This test is not yet approved or cleared by the [place] FDA and  has been authorized for detection and/or diagnosis of SARS-CoV-2 by FDA under an Emergency Use Authorization (EUA). This EUA will remain  in effect (meaning this test can be used) for the duration of the COVID-19 declaration under Section 56 4(b)(1) of the Act, 21 U.S.C. section 360bbb-3(b)(1), unless the authorization is terminated or revoked sooner. Performed at [facility], [place] [address] [place], [place] [address]     Labs: Results for orders placed or performed during the hospital encounter of 11/18/18 (from the past 48 hour(s))  CBC     Status: None   Collection Time: 11/18/18  5:51 PM  Result Value Ref Range   WBC 7.0 4.0 - 10.5 K/uL   RBC 4.84 4.22 - 5.81 MIL/uL   Hemoglobin 14.8 13.0 - 17.0 g/dL   HCT 45.6 39.0 - 52.0 %   MCV 94.2 80.0 - 100.0 fL   MCH 30.6 26.0 - 34.0 pg   MCHC 32.5 30.0 - 36.0 g/dL   RDW 13.2 11.5 - 15.5 %   Platelets 349 150 - 400 K/uL   nRBC 0.0 0.0 - 0.2 %    Comment: Performed at [facility], [place] [address] [place],  [address]  Comprehensive metabolic panel     Status: None   Collection Time: 11/18/18  5:51 PM   Result Value Ref Range   Sodium 139 135 - 145 mmol/L   Potassium 3.9 3.5 - 5.1 mmol/L   Chloride 103 98 - 111 mmol/L   CO2 24 22 - 32 mmol/L   Glucose, Bld 93 70 - 99 mg/dL   BUN 12 6 - 20 mg/dL   Creatinine, Ser 1.24 0.61 - 1.24 mg/dL   Calcium 9.2 8.9 - 10.3 mg/dL   Total Protein 7.1 6.5 - 8.1 g/dL   Albumin 4.3 3.5 - 5.0 g/dL   AST 18 15 - 41 U/L   ALT 30 0 - 44 U/L   Alkaline Phosphatase 83 38 - 126 U/L   Total Bilirubin 0.7 0.3 - 1.2 mg/dL   GFR calc non Af Amer >60 >60 mL/min   GFR calc Af Amer >60 >60 mL/min   Anion gap 12 5 - 15    Comment: Performed at [facility], [address] [place],  [place] [address]  Prealbumin     Status: None   Collection Time: 11/19/18 12:30 AM  Result Value Ref Range   Prealbumin 30.4 18 - 38 mg/dL    Comment: Performed at [facility], [place] [address], [place], [place] [address]    Imaging / Studies: No results found.  Medications / Allergies: per chart  Antibiotics: Anti-infectives (From admission, onward)   None        Note: Portions of this report may have been transcribed using voice recognition software. Every effort was made to ensure accuracy; however, inadvertent computerized transcription errors may be present.   Any transcriptional errors that result from this process are unintentional.     Adin Hector, MD, FACS, MASCRS Gastrointestinal and Minimally Invasive Surgery    [address] [place], [place] [address] [phone] Main / Paging [phone] Fax

## 2018-11-20 NOTE — Progress Notes (Signed)
Travis Kaufman [id number] Oct 03, 1970  CARE TEAM:  PCP: Luetta Nutting, DO  Outpatient Care Team: Patient Care Team: Luetta Nutting, DO as PCP - General (Family Medicine) Sueanne Margarita, MD as PCP - Cardiology (Cardiology) Michael Boston, MD as Consulting Physician (General Surgery) Ronnette Juniper, MD as Consulting Physician (Gastroenterology)  Inpatient Treatment Team: Treatment Team: Attending Provider: Michael Boston, MD; Technician: Leda Quail, NT; Registered Nurse: Heloise Ochoa, RN; Technician: Aida Raider, NT   Problem List:   Principal Problem:   Dysphagia Active Problems:   HLD (hyperlipidemia)   Bradycardia   GERD with esophagitis s/p Nissen fundoplication [id number]   Insomnia   Episode of gagging      11/03/2018  Procedure(s): XI ROBOTIC ASSISTED NISSEN FUNDOPLICATION    Assessment  Fair.  [facility] Stay = 2 days)  Plan:  IV steroids.  Repeat esophagram did show sl tight wrap, but contrast did pass through.  Pt improving.  OK for small amounts of full liquids.  Continue IV steroids and IV PPI.    Magic mouthwash given phone fullness sensation in upper mouth.  See if they can help calm down any potential irritation or pseudoreflux.  Pain & nausea control.  IV PPI  -VTE prophylaxis- SCDs, etc  -mobilize as tolerated to help recovery    11/20/2018    Subjective: (Chief complaint)  Denies pain or nausea.  Feeling like things are significantly better, but not completely resolved.  Did have one episode of emesis last night.     Objective:  Vital signs:  Vitals:   11/19/18 0554 11/19/18 0934 11/19/18 2046 11/20/18 0624  BP: 105/77 119/72 114/88 107/70  Pulse: (!) 50 (!) 46 (!) 51 (!) 43  Resp: 18 18 15 18   Temp: 97.9 F (36.6 C) 98.4 F (36.9 C) 97.7 F (36.5 C) 98 F (36.7 C)  TempSrc: Oral Oral Oral Oral  SpO2: 98% 98% 91% 98%    Last BM Date: 11/18/18  Intake/Output   Yesterday:  09/18 0701 -  09/19 0700 In: 2497.8 [P.O.:600; I.V.:1631.1; IV Piggyback:266.7] Out: 2700 [Urine:2700] This shift:  No intake/output data recorded.  Bowel function:  Flatus: YES  BM:  YES  Drain: (No drain)   Physical Exam:  General: Pt awake/alert/oriented x4 in no acute distress.  Smiling.  Calm.  Breathing normally.  No splinting or guarding. Eyes: PERRL, normal EOM.  Sclera clear.  No icterus Psych:  No delerium/psychosis/paranoia HENT: Normocephalic, Mucus membranes moist.  No thrush Neck: Supple, No tracheal deviation Chest:  No chest wall pain w good excursion Abdomen: Soft.  Nondistended.  Nontender.  No evidence of peritonitis.  No incarcerated hernias. Ext:   No deformity.  No mjr edema.  No cyanosis Skin: No petechiae / purpura  Results:   Cultures: Recent Results (from the past 720 hour(s))  SARS CORONAVIRUS 2 (TAT 6-12 HRS) Nasal Swab Aptima Multi Swab     Status: None   Collection Time: 10/30/18 12:19 PM   Specimen: Aptima Multi Swab; Nasal Swab  Result Value Ref Range Status   SARS Coronavirus 2 NEGATIVE NEGATIVE Final    Comment: (NOTE) SARS-CoV-2 target nucleic acids are NOT DETECTED. The SARS-CoV-2 RNA is generally detectable in upper and lower respiratory specimens during the acute phase of infection. Negative results do not preclude SARS-CoV-2 infection, do not rule out co-infections with other pathogens, and should not be used as the sole basis for treatment or other patient management decisions. Negative results must be combined with clinical observations, patient  history, and epidemiological information. The expected result is Negative. Fact Sheet for Patients: SugarRoll.be Fact Sheet for Healthcare Providers: https://www.woods-mathews.com/ This test is not yet approved or cleared by the [place] FDA and  has been authorized for detection and/or diagnosis of SARS-CoV-2 by FDA under an Emergency Use Authorization  (EUA). This EUA will remain  in effect (meaning this test can be used) for the duration of the COVID-19 declaration under Section 56 4(b)(1) of the Act, 21 U.S.C. section 360bbb-3(b)(1), unless the authorization is terminated or revoked sooner. Performed at [facility], [place] [address], [place], [place] [address]     Labs: Results for orders placed or performed during the hospital encounter of 11/18/18 (from the past 48 hour(s))  CBC     Status: None   Collection Time: 11/18/18  5:51 PM  Result Value Ref Range   WBC 7.0 4.0 - 10.5 K/uL   RBC 4.84 4.22 - 5.81 MIL/uL   Hemoglobin 14.8 13.0 - 17.0 g/dL   HCT 45.6 39.0 - 52.0 %   MCV 94.2 80.0 - 100.0 fL   MCH 30.6 26.0 - 34.0 pg   MCHC 32.5 30.0 - 36.0 g/dL   RDW 13.2 11.5 - 15.5 %   Platelets 349 150 - 400 K/uL   nRBC 0.0 0.0 - 0.2 %    Comment: Performed at [facility],  [address] [place], [place] [address]  Comprehensive metabolic panel     Status: None   Collection Time: 11/18/18  5:51 PM  Result Value Ref Range   Sodium 139 135 - 145 mmol/L   Potassium 3.9 3.5 - 5.1 mmol/L   Chloride 103 98 - 111 mmol/L   CO2 24 22 - 32 mmol/L   Glucose, Bld 93 70 - 99 mg/dL   BUN 12 6 - 20 mg/dL   Creatinine, Ser 1.24 0.61 - 1.24 mg/dL   Calcium 9.2 8.9 - 10.3 mg/dL   Total Protein 7.1 6.5 - 8.1 g/dL   Albumin 4.3 3.5 - 5.0 g/dL   AST 18 15 - 41 U/L   ALT 30 0 - 44 U/L   Alkaline Phosphatase 83 38 - 126 U/L   Total Bilirubin 0.7 0.3 - 1.2 mg/dL   GFR calc non Af Amer >60 >60 mL/min   GFR calc Af Amer >60 >60 mL/min   Anion gap 12 5 - 15    Comment: Performed at [facility] [place] [address] [place], [place] [address]  Prealbumin     Status: None   Collection Time: 11/19/18 12:30 AM  Result Value Ref Range   Prealbumin 30.4 18 - 38 mg/dL    Comment: Performed at [facility], [place] [address] [place] [address], [place], [place] [address]    Imaging / Studies: Dg Esophagus W  Single Cm (sol Or Thin Ba)  Result Date: 11/19/2018 CLINICAL DATA:  Here for evaluation of dysphagia. History of Nissen fundoplication on [id number] for severe esophagitis refractory to medical management. EXAM: ESOPHOGRAM/BARIUM SWALLOW TECHNIQUE: Single contrast examination was performed using water-soluble contrast. FLUOROSCOPY TIME:  Fluoroscopy Time:  1.4 minutes Radiation Exposure Index (if provided by the fluoroscopic device): 9.1 mGy Number of Acquired Spot Images: 0 COMPARISON:  Esophagram dated 11/04/2018. FINDINGS: There is no tracheal aspiration contrast. The esophagus is normal in course. There is narrowing at the site Nissen fundoplication which is 3.6 cm in length. There is pooling of contrast in the esophagus proximal to the narrowing. The gastroesophageal junction is  normally positioned. IMPRESSION: Narrowing at the site of Nissen fundoplication measuring 3.6 cm in length with pooling of contrast in the esophagus proximal to the operative site. Electronically Signed   By: Zerita Boers M.D.   On: 11/19/2018 13:57    Medications / Allergies: per chart  Antibiotics: Anti-infectives (From admission, onward)   None        Note: Portions of this report may have been transcribed using voice recognition software. Every effort was made to ensure accuracy; however, inadvertent computerized transcription errors may be present.   Any transcriptional errors that result from this process are unintentional.     Milus Height, MD [facility] Surgical Oncology, General Surgery, Trauma and [facility], [place] [phone] Check [email or web address], password [email or web address] for coverage night/weekend     [address], [place], [place] [address] [phone] Main / Paging [phone] Fax

## 2018-11-21 NOTE — Progress Notes (Addendum)
Jazier Handke [place] [id number] 1970-09-13  CARE TEAM:  PCP: Luetta Nutting, DO  Outpatient Care Team: Patient Care Team: Luetta Nutting, DO as PCP - General (Family Medicine) Sueanne Margarita, MD as PCP - Cardiology (Cardiology) Michael Boston, MD as Consulting Physician (General Surgery) Ronnette Juniper, MD as Consulting Physician (Gastroenterology)  Inpatient Treatment Team: Treatment Team: Attending Provider: Michael Boston, MD; Technician: Leda Quail, NT; Registered Nurse: Heloise Ochoa, RN; Technician: Aida Raider, NT   Problem List:   Principal Problem:   Dysphagia Active Problems:   HLD (hyperlipidemia)   Bradycardia   GERD with esophagitis s/p Nissen fundoplication [id number]   Insomnia   Episode of gagging      11/03/2018  Procedure(s): XI ROBOTIC ASSISTED NISSEN FUNDOPLICATION    Assessment  Fair.  [facility] Stay = 3 days)  Plan:  IV steroids.  Repeat esophagram did show sl tight wrap, but contrast did pass through.  Pt improving.  OK for small amounts of full liquids.  Continue IV steroids and IV PPI.    Pain & nausea control adequate  IV PPI  -VTE prophylaxis- SCDs, etc  -mobilize as tolerated to help recovery    11/21/2018    Subjective: (Chief complaint)  Denies pain or nausea.  Feeling like things are improving daily - had some issues with potatoes yesterday (reports they gave him "food poisoning" as it caused emesis) but wife brought him chicken noodle soup which went we.   Objective:  Vital signs:  Vitals:   11/20/18 1346 11/20/18 1808 11/20/18 2102 11/21/18 0549  BP: 118/78  118/72 121/70  Pulse: (!) 42  (!) 40 (!) 50  Resp: 18  18 16   Temp: 98.1 F (36.7 C) 98.5 F (36.9 C) 97.7 F (36.5 C) 98.1 F (36.7 C)  TempSrc: Oral Oral Oral Oral  SpO2: 97%  98% 97%    Last BM Date: 11/18/18  Intake/Output   Yesterday:  09/19 0701 - 09/20 0700 In: 2561.8 [P.O.:360; I.V.:2051.8; IV Piggyback:149.9] Out:  2400 [Urine:2400] This shift:  No intake/output data recorded.  Bowel function:  Flatus: YES  BM:  YES  Drain: (No drain)   Physical Exam:  General: Pt awake/alert/oriented x4 in no acute distress.  Smiling.  Calm.  Breathing normally.  No splinting or guarding. Eyes: PERRL, normal EOM.  Sclera clear.  No icterus Psych:  No delerium/psychosis/paranoia HENT: Normocephalic, Mucus membranes moist.  No thrush Neck: Supple, No tracheal deviation Chest:  No chest wall pain w good excursion Abdomen: Soft.  Nondistended.  Nontender.  No evidence of peritonitis.  No incarcerated hernias. Ext:   No deformity.  No edema.  No cyanosis Skin: No petechiae / purpura  Results:   Cultures: Recent Results (from the past 720 hour(s))  SARS CORONAVIRUS 2 (TAT 6-12 HRS) Nasal Swab Aptima Multi Swab     Status: None   Collection Time: 10/30/18 12:19 PM   Specimen: Aptima Multi Swab; Nasal Swab  Result Value Ref Range Status   SARS Coronavirus 2 NEGATIVE NEGATIVE Final    Comment: (NOTE) SARS-CoV-2 target nucleic acids are NOT DETECTED. The SARS-CoV-2 RNA is generally detectable in upper and lower respiratory specimens during the acute phase of infection. Negative results do not preclude SARS-CoV-2 infection, do not rule out co-infections with other pathogens, and should not be used as the sole basis for treatment or other patient management decisions. Negative results must be combined with clinical observations, patient history, and epidemiological information. The expected result is Negative. Fact Sheet for  Patients: SugarRoll.be Fact Sheet for Healthcare Providers: https://www.woods-mathews.com/ This test is not yet approved or cleared by the [place] FDA and  has been authorized for detection and/or diagnosis of SARS-CoV-2 by FDA under an Emergency Use Authorization (EUA). This EUA will remain  in effect (meaning this test can be used) for the  duration of the COVID-19 declaration under Section 56 4(b)(1) of the Act, 21 U.S.C. section 360bbb-3(b)(1), unless the authorization is terminated or revoked sooner. Performed at [facility], [place] [address] [place], [place] [address]     Labs: No results found for this or any previous visit (from the past 48 hour(s)).  Imaging / Studies: Dg Esophagus W Single Cm (sol Or Thin Ba)  Result Date: 11/19/2018 CLINICAL DATA:  Here for evaluation of dysphagia. History of Nissen fundoplication on 99991111 for severe esophagitis refractory to medical management. EXAM: ESOPHOGRAM/BARIUM SWALLOW TECHNIQUE: Single contrast examination was performed using water-soluble contrast. FLUOROSCOPY TIME:  Fluoroscopy Time:  1.4 minutes Radiation Exposure Index (if provided by the fluoroscopic device): 9.1 mGy Number of Acquired Spot Images: 0 COMPARISON:  Esophagram dated 11/04/2018. FINDINGS: There is no tracheal aspiration contrast. The esophagus is normal in course. There is narrowing at the site Nissen fundoplication which is 3.6 cm in length. There is pooling of contrast in the esophagus proximal to the narrowing. The gastroesophageal junction is normally positioned. IMPRESSION: Narrowing at the site of Nissen fundoplication measuring 3.6 cm in length with pooling of contrast in the esophagus proximal to the operative site. Electronically Signed   By: Zerita Boers M.D.   On: 11/19/2018 13:57    Medications / Allergies: per chart  Antibiotics: Anti-infectives (From admission, onward)   None        Note: Portions of this report may have been transcribed using voice recognition software. Every effort was made to ensure accuracy; however, inadvertent computerized transcription errors may be present.   Any transcriptional errors that result from this process are unintentional.     Sharon Mt. Dema Severin, M.D. [facility] [phone] Check [email or web address], password [email or web address] for  coverage night/weekend     [address] [place], [place] [address] [phone] Main / Paging [phone] Fax

## 2018-11-21 NOTE — Plan of Care (Signed)

## 2018-11-22 ENCOUNTER — Ambulatory Visit (HOSPITAL_COMMUNITY): Payer: BC Managed Care – PPO

## 2018-11-22 ENCOUNTER — Ambulatory Visit (HOSPITAL_COMMUNITY): Admission: RE | Admit: 2018-11-22 | Payer: BC Managed Care – PPO | Source: Ambulatory Visit

## 2018-11-22 MED ORDER — LORAZEPAM 2 MG/ML IJ SOLN: 1.0000 mg | Freq: Every evening | INTRAMUSCULAR | Status: DC | PRN

## 2018-11-22 MED ORDER — PANTOPRAZOLE SODIUM 40 MG PO TBEC
40.0000 mg | DELAYED_RELEASE_TABLET | Freq: Two times a day (BID) | ORAL | Status: DC
  Administered 2018-11-22 – 2018-11-23 (×3): 40 mg via ORAL
  Filled 2018-11-22 (×3): qty 1

## 2018-11-22 MED ORDER — SODIUM CHLORIDE 0.9 % IV SOLN: 250.0000 mL | INTRAVENOUS | Status: DC | PRN

## 2018-11-22 MED ORDER — ATORVASTATIN CALCIUM 40 MG PO TABS
40.0000 mg | ORAL_TABLET | Freq: Every day | ORAL | Status: DC
  Administered 2018-11-22: 09:00:00 40 mg via ORAL
  Filled 2018-11-22: qty 1

## 2018-11-22 MED ORDER — SODIUM CHLORIDE 0.9% FLUSH: 3.0000 mL | INTRAVENOUS | Status: DC | PRN

## 2018-11-22 MED ORDER — HYDROCODONE-ACETAMINOPHEN 5-325 MG PO TABS: 1.0000 | ORAL_TABLET | Freq: Four times a day (QID) | ORAL | Status: DC | PRN

## 2018-11-22 MED ORDER — LACTATED RINGERS IV BOLUS: 1000.0000 mL | Freq: Once | INTRAVENOUS | Status: DC

## 2018-11-22 MED ORDER — PREDNISONE 5 MG/5ML PO SOLN
10.0000 mg | Freq: Two times a day (BID) | ORAL | Status: DC
  Administered 2018-11-22 – 2018-11-23 (×3): 10 mg via ORAL
  Filled 2018-11-22 (×6): qty 10

## 2018-11-22 MED ORDER — DEXAMETHASONE SODIUM PHOSPHATE 4 MG/ML IJ SOLN
4.0000 mg | Freq: Once | INTRAMUSCULAR | Status: AC
  Administered 2018-11-22: 4 mg via INTRAVENOUS
  Filled 2018-11-22: qty 1

## 2018-11-22 MED ORDER — HYDROMORPHONE HCL 1 MG/ML IJ SOLN
0.5000 mg | INTRAMUSCULAR | Status: DC | PRN
  Administered 2018-11-22: 1 mg via INTRAVENOUS
  Filled 2018-11-22: qty 1

## 2018-11-22 MED ORDER — SODIUM CHLORIDE 0.9% FLUSH
3.0000 mL | Freq: Two times a day (BID) | INTRAVENOUS | Status: DC
  Administered 2018-11-22: 3 mL via INTRAVENOUS

## 2018-11-22 MED ORDER — DEXAMETHASONE SODIUM PHOSPHATE 4 MG/ML IJ SOLN: 4.0000 mg | Freq: Two times a day (BID) | INTRAMUSCULAR | Status: DC

## 2018-11-22 NOTE — Progress Notes (Addendum)
Travis Kaufman [id number] 08/31/1970  CARE TEAM:  PCP: Luetta Nutting, DO  Outpatient Care Team: Patient Care Team: Luetta Nutting, DO as PCP - General (Family Medicine) Sueanne Margarita, MD as PCP - Cardiology (Cardiology) Michael Boston, MD as Consulting Physician (General Surgery) Ronnette Juniper, MD as Consulting Physician (Gastroenterology)  Inpatient Treatment Team: Treatment Team: Attending Provider: Michael Boston, MD; Technician: Leda Quail, NT; Registered Nurse: Heloise Ochoa, RN; Technician: Abbe [place], NT; Registered Nurse: Jennye Boroughs, RN; Utilization Review: Sindy [place], RN   Problem List:   Principal Problem:   Dysphagia Active Problems:   HLD (hyperlipidemia)   Bradycardia   GERD with esophagitis s/p Nissen fundoplication [id number]   Insomnia   Episode of gagging      11/03/2018  Procedure(s): XI ROBOTIC ASSISTED NISSEN FUNDOPLICATION    Assessment  Fair  [facility] Stay = 4 days)  Plan:  IV steroids.  Order timed out.  I will give 1 more IV dose and switch to oral prednisone.  See if he can tolerate that.  Most likely continue for 7 more days since he seems to be slowly improving.  Convert medicines to p.o.  Dysphagia 1 pured diet which is what I would recommend at home.  See if he can tolerate that.  I am skeptical he truly had food poisoning, just poor tolerance and retching to mashed potatoes.  If he can be retch free for more than 24 hours, he can make his case that it is safe to go home on that diet.  If he has recurrent retching, then thin liquids only for the next week  I did note that it is common to lose 5-15 pounds after fundoplication surgery given the transition to liquid diet especially the first few weeks.  As the wrap edema and dysphasia abate, caloric intake can improve with more solid type diet and the weight will regain over the next few months.  There is no evidence of dehydration.  We will stop him off IV  fluids to make sure he can keep himself hydrated on his own.   Stop IV fluids.  Insomnia.  Hopefully we are on the right track.  He slept the best he has in a while.  Magic mouthwash as needed given the irritation and atypical sensation in upper mouth.  He feels that is definitely betterSee if they can help calm down any potential irritation or pseudoreflux.  Pain & nausea control.  IV PPI  -VTE prophylaxis- SCDs, etc  -mobilize as tolerated to help recovery  I updated the patient's status to the patient and nurse.  Recommendations were made.  Questions were answered.  They expressed understanding & appreciation.    30 minutes spent in review, evaluation, examination, counseling, and coordination of care.  More than 50% of that time was spent in counseling.  11/22/2018    Subjective: (Chief complaint)  Had a much better night.  Actually slept with the help of IV Ativan.  Tolerated some full liquids better.  No nausea or retching.  Concern about weight loss  Objective:  Vital signs:  Vitals:   11/21/18 0549 11/21/18 1331 11/21/18 2152 11/22/18 0519  BP: 121/70 114/72 129/79 134/81  Pulse: (!) 50 (!) 40 (!) 42 (!) 48  Resp: 16 16 18 16   Temp: 98.1 F (36.7 C) (!) 97.5 F (36.4 C) 98.3 F (36.8 C) 98.1 F (36.7 C)  TempSrc: Oral Oral Oral Oral  SpO2: 97% 98% 99% 98%  Last BM Date: 11/19/18  Intake/Output   Yesterday:  09/20 0701 - 09/21 0700 In: 3039.8 [P.O.:720; I.V.:2209.8; IV Piggyback:110] Out: 2200 [Urine:2200] This shift:  Total I/O In: -  Out: 350 [Urine:350]  Bowel function:  Flatus: YES  BM:  YES  Drain: (No drain)   Physical Exam:  General: Pt awake/alert/oriented x4 in no acute distress.  Smiling.  Calm.  Breathing normally.  No splinting or guarding. Eyes: PERRL, normal EOM.  Sclera clear.  No icterus Neuro: CN II-XII intact w/o focal sensory/motor deficits. Lymph: No head/neck/groin lymphadenopathy Psych:  No  delerium/psychosis/paranoia HENT: Normocephalic, Mucus membranes moist.  No thrush Neck: Supple, No tracheal deviation Chest:  No chest wall pain w good excursion CV:  Pulses intact.  Regular rhythm MS: Normal AROM mjr joints.  No obvious deformity  Abdomen: Soft.  Nondistended.  Nontender.  No evidence of peritonitis.  No incarcerated hernias.  Ext:   No deformity.  No mjr edema.  No cyanosis Skin: No petechiae / purpura  Results:   Cultures: Recent Results (from the past 720 hour(s))  SARS CORONAVIRUS 2 (TAT 6-12 HRS) Nasal Swab Aptima Multi Swab     Status: None   Collection Time: 10/30/18 12:19 PM   Specimen: Aptima Multi Swab; Nasal Swab  Result Value Ref Range Status   SARS Coronavirus 2 NEGATIVE NEGATIVE Final    Comment: (NOTE) SARS-CoV-2 target nucleic acids are NOT DETECTED. The SARS-CoV-2 RNA is generally detectable in upper and lower respiratory specimens during the acute phase of infection. Negative results do not preclude SARS-CoV-2 infection, do not rule out co-infections with other pathogens, and should not be used as the sole basis for treatment or other patient management decisions. Negative results must be combined with clinical observations, patient history, and epidemiological information. The expected result is Negative. Fact Sheet for Patients: SugarRoll.be Fact Sheet for Healthcare Providers: https://www.woods-mathews.com/ This test is not yet approved or cleared by the [place] FDA and  has been authorized for detection and/or diagnosis of SARS-CoV-2 by FDA under an Emergency Use Authorization (EUA). This EUA will remain  in effect (meaning this test can be used) for the duration of the COVID-19 declaration under Section 56 4(b)(1) of the Act, 21 U.S.C. section 360bbb-3(b)(1), unless the authorization is terminated or revoked sooner. Performed at [facility], [place] [address], [place], [place]  [address]     Labs: No results found for this or any previous visit (from the past 48 hour(s)).  Imaging / Studies: No results found.  Medications / Allergies: per chart  Antibiotics: Anti-infectives (From admission, onward)   None        Note: Portions of this report may have been transcribed using voice recognition software. Every effort was made to ensure accuracy; however, inadvertent computerized transcription errors may be present.   Any transcriptional errors that result from this process are unintentional.     Adin Hector, MD, FACS, MASCRS Gastrointestinal and Minimally Invasive Surgery    [address], [place], [place] [address] [phone] Main / Paging [phone] Fax

## 2018-11-23 MED ORDER — PREDNISONE 5 MG/5ML PO SOLN: 10.0000 mg | Freq: Every day | ORAL | 0 refills | Status: AC

## 2018-11-23 MED ORDER — FLUCONAZOLE 100 MG PO TABS
200.0000 mg | ORAL_TABLET | Freq: Once | ORAL | Status: AC
  Administered 2018-11-23: 09:00:00 200 mg via ORAL
  Filled 2018-11-23: qty 2

## 2018-11-23 NOTE — Discharge Instructions (Signed)
Complete 7 more days of oral steroids (prednisone) to help with irritation and swelling around wrap.  Stick to a pured/portable liquid diet for the next 2 weeks (LEVEL 1 DIET)   EATING AFTER YOUR ESOPHAGEAL SURGERY (Stomach Fundoplication, Hiatal Hernia repair, Achalasia surgery, etc)  ######################################################################  EAT Start with a pureed / full liquid diet (see below) Gradually transition to a high fiber diet with a fiber supplement over the next month after discharge.    WALK Walk an hour a day.  Control your pain to do that.    CONTROL PAIN Control pain so that you can walk, sleep, tolerate sneezing/coughing, go up/down stairs.  HAVE A BOWEL MOVEMENT DAILY Keep your bowels regular to avoid problems.  OK to try a laxative to override constipation.  OK to use an antidairrheal to slow down diarrhea.  Call if not better after 2 tries  CALL IF YOU HAVE PROBLEMS/CONCERNS Call if you are still struggling despite following these instructions. Call if you have concerns not answered by these instructions  ######################################################################   After your esophageal surgery, expect some sticking with swallowing over the next 1-2 months.    If food sticks when you eat, it is called "dysphagia".  This is due to swelling around your esophagus at the wrap & hiatal diaphragm repair.  It will gradually ease off over the next few months.  To help you through this temporary phase, we start you out on a pureed (blenderized) diet.  Your first meal in the hospital was thin liquids.  You should have been given a pureed diet by the time you left the hospital.  We ask patients to stay on a pureed diet for the first 2-3 weeks to avoid anything getting "stuck" near your recent surgery.  Don't be alarmed if your ability to swallow doesn't progress according to this plan.  Everyone is different and some diets can advance more or  less quickly.    It is often helpful to crush your medications or split them as they can sometimes stick, especially the first week or so.   Some BASIC RULES to follow are:  Maintain an upright position whenever eating or drinking.  Take small bites - just a teaspoon size bite at a time.  Eat slowly.  It may also help to eat only one food at a time.  Consider nibbling through smaller, more frequent meals & avoid the urge to eat BIG meals  Do not push through feelings of fullness, nausea, or bloatedness  Do not mix solid foods and liquids in the same mouthful  Try not to "wash foods down" with large gulps of liquids.  Avoid carbonated (bubbly/fizzy) drinks.    Avoid foods that make you feel gassy or bloated.  Start with bland foods first.  Wait on trying greasy, fried, or spicy meals until you are tolerating more bland solids well.  Understand that it will be hard to burp and belch at first.  This gradually improves with time.  Expect to be more gassy/flatulent/bloated initially.  Walking will help your body manage it better.  Consider using medications for bloating that contain simethicone such as  Maalox or Gas-X   Consider crushing her medications, especially smaller pills.  The ability to swallow pills should get easier after a few weeks  Eat in a relaxed atmosphere & minimize distractions.  Avoid talking while eating.    Do not use straws.  Following each meal, sit in an upright position (90 degree angle) for 60  to 90 minutes.  Going for a short walk can help as well  If food does stick, don't panic.  Try to relax and let the food pass on its own.  Sipping WARM LIQUID such as strong hot black tea can also help slide it down.   Be gradual in changes & use common sense:  -If you easily tolerating a certain "level" of foods, advance to the next level gradually -If you are having trouble swallowing a particular food, then avoid it.   -If food is sticking when you advance  your diet, go back to thinner previous diet (the lower LEVEL) for 1-2 days.  LEVEL 1 = PUREED DIET  Do for the first 2 WEEKS AFTER SURGERY  -Foods in this group are pureed or blenderized to a smooth, mashed potato-like consistency.  -If necessary, the pureed foods can keep their shape with the addition of a thickening agent.   -Meat should be pureed to a smooth, pasty consistency.  Hot broth or gravy may be added to the pureed meat, approximately 1 oz. of liquid per 3 oz. serving of meat. -CAUTION:  If any foods do not puree into a smooth consistency, swallowing will be more difficult.  (For example, nuts or seeds sometimes do not blend well.)  Hot Foods Cold Foods  Pureed scrambled eggs and cheese Pureed cottage cheese  Baby cereals Thickened juices and nectars  Thinned cooked cereals (no lumps) Thickened milk or eggnog  Pureed [place] toast or pancakes Ensure  Mashed potatoes Ice cream  Pureed parsley, au gratin, scalloped potatoes, candied sweet potatoes Fruit or [place] ice, sherbet  Pureed buttered or alfredo noodles Plain yogurt  Pureed vegetables (no corn or peas) Instant breakfast  Pureed soups and creamed soups Smooth pudding, mousse, custard  Pureed scalloped apples Whipped gelatin  Gravies Sugar, syrup, honey, jelly  Sauces, cheese, tomato, barbecue, white, creamed Cream  Any baby food Creamer  Alcohol in moderation (not beer or champagne) Margarine  Coffee or tea Mayonnaise   Ketchup, mustard   Apple sauce   SAMPLE MENU:  PUREED DIET Breakfast Lunch Dinner   Orange juice, 1/2 cup  Cream of wheat, 1/2 cup  Pineapple juice, 1/2 cup  Pureed [place], barley soup, 3/4 cup  Pureed Hawaiian chicken, 3 oz   Scrambled eggs, mashed or blended with cheese, 1/2 cup  Tea or coffee, 1 cup   Whole milk, 1 cup   Non-dairy creamer, 2 Tbsp.  Mashed potatoes, 1/2 cup  Pureed cooled broccoli, 1/2 cup  Apple sauce, 1/2 cup  Coffee or tea  Mashed potatoes, 1/2 cup  Pureed  spinach, 1/2 cup  Frozen yogurt, 1/2 cup  Tea or coffee      LEVEL 2 = SOFT DIET  After your first 2 weeks, you can advance to a soft diet.   Keep on this diet until everything goes down easily.  Hot Foods Cold Foods  White fish Cottage cheese  Stuffed fish Junior baby fruit  Baby food meals Semi thickened juices  Minced soft cooked, scrambled, poached eggs nectars  Souffle & omelets Ripe mashed bananas  Cooked cereals Canned fruit, pineapple sauce, milk  potatoes Milkshake  Buttered or Alfredo noodles Custard  Cooked cooled vegetable Puddings, including tapioca  Sherbet Yogurt  Vegetable soup or alphabet soup Fruit ice, [place] ice  Gravies Whipped gelatin  Sugar, syrup, honey, jelly Junior baby desserts  Sauces:  Cheese, creamed, barbecue, tomato, white Cream  Coffee or tea Margarine   SAMPLE MENU:  LEVEL 2 Breakfast Lunch Dinner   Orange juice, 1/2 cup  Oatmeal, 1/2 cup  Scrambled eggs with cheese, 1/2 cup  Decaffeinated tea, 1 cup  Whole milk, 1 cup  Non-dairy creamer, 2 Tbsp  Pineapple juice, 1/2 cup  Minced beef, 3 oz  Gravy, 2 Tbsp  Mashed potatoes, 1/2 cup  Minced fresh broccoli, 1/2 cup  Applesauce, 1/2 cup  Coffee, 1 cup  [place], barley soup, 3/4 cup  Minced Hawaiian chicken, 3 oz  Mashed potatoes, 1/2 cup  Cooked spinach, 1/2 cup  Frozen yogurt, 1/2 cup  Non-dairy creamer, 2 Tbsp      LEVEL 3 = CHOPPED DIET  -After all the foods in level 2 (soft diet) are passing through well you should advance up to more chopped foods.  -It is still important to cut these foods into small pieces and eat slowly.  Hot Foods Cold Foods  Poultry Cottage cheese  Chopped Swedish meatballs Yogurt  Meat salads (ground or flaked meat) Milk  Flaked fish (tuna) Milkshakes  Poached or scrambled eggs Soft, cold, dry cereal  Souffles and omelets Fruit juices or nectars  Cooked cereals Chopped canned fruit  Chopped [place] toast or pancakes Canned  fruit cocktail  Noodles or pasta (no rice) Pudding, mousse, custard  Cooked vegetables (no frozen peas, corn, or mixed vegetables) Green salad  Canned small sweet peas Ice cream  Creamed soup or vegetable soup Fruit ice, [place] ice  Pureed vegetable soup or alphabet soup Non-dairy creamer  Ground scalloped apples Margarine  Gravies Mayonnaise  Sauces:  Cheese, creamed, barbecue, tomato, white Ketchup  Coffee or tea Mustard   SAMPLE MENU:  LEVEL 3 Breakfast Lunch Dinner   Orange juice, 1/2 cup  Oatmeal, 1/2 cup  Scrambled eggs with cheese, 1/2 cup  Decaffeinated tea, 1 cup  Whole milk, 1 cup  Non-dairy creamer, 2 Tbsp  Ketchup, 1 Tbsp  Margarine, 1 tsp  Salt, 1/4 tsp  Sugar, 2 tsp  Pineapple juice, 1/2 cup  Ground beef, 3 oz  Gravy, 2 Tbsp  Mashed potatoes, 1/2 cup  Cooked spinach, 1/2 cup  Applesauce, 1/2 cup  Decaffeinated coffee  Whole milk  Non-dairy creamer, 2 Tbsp  Margarine, 1 tsp  Salt, 1/4 tsp  Pureed [place], barley soup, 3/4 cup  Barbecue chicken, 3 oz  Mashed potatoes, 1/2 cup  Ground fresh broccoli, 1/2 cup  Frozen yogurt, 1/2 cup  Decaffeinated tea, 1 cup  Non-dairy creamer, 2 Tbsp  Margarine, 1 tsp  Salt, 1/4 tsp  Sugar, 1 tsp    LEVEL 4:  REGULAR FOODS  -Foods in this group are soft, moist, regularly textured foods.   -This level includes meat and breads, which tend to be the hardest things to swallow.   -Eat very slowly, chew well and continue to avoid carbonated drinks. -most people are at this level in 4-6 weeks  Hot Foods Cold Foods  Baked fish or skinned Soft cheeses - cottage cheese  Souffles and omelets Cream cheese  Eggs Yogurt  Stuffed shells Milk  Spaghetti with meat sauce Milkshakes  Cooked cereal Cold dry cereals (no nuts, dried fruit, coconut)  [place] toast or pancakes Crackers  Buttered toast Fruit juices or nectars  Noodles or pasta (no rice) Canned fruit  Potatoes (all types) Ripe bananas    Soft, cooked vegetables (no corn, lima, or baked beans) Peeled, ripe, fresh fruit  Creamed soups or vegetable soup Cakes (no nuts, dried fruit, coconut)  Canned chicken noodle soup Plain doughnuts  Gravies  Ice cream  Bacon dressing Pudding, mousse, custard  Sauces:  Cheese, creamed, barbecue, tomato, white Fruit ice, [place] ice, sherbet  Decaffeinated tea or coffee Whipped gelatin  Pork chops Regular gelatin   Canned fruited gelatin molds   Sugar, syrup, honey, jam, jelly   Cream   Non-dairy   Margarine   Oil   Mayonnaise   Ketchup   Mustard   TROUBLESHOOTING IRREGULAR BOWELS  1) Avoid extremes of bowel movements (no bad constipation/diarrhea)  2) Miralax 17gm mixed in 8oz. water or juice-daily. May use BID as needed.  3) Gas-x,Phazyme, etc. as needed for gas & bloating.  4) Soft,bland diet. No spicy,greasy,fried foods.  5) Prilosec over-the-counter as needed  6) May hold gluten/wheat products from diet to see if symptoms improve.  7) May try probiotics (Align, Activa, etc) to help calm the bowels down  7) If symptoms become worse call back immediately.    If you have any questions please call our office at [place]: [phone].

## 2018-11-23 NOTE — Discharge Summary (Signed)
Physician Discharge Summary    Patient ID: Travis Kaufman MRN: [id number] DOB/AGE: [birth year]  48 y.o.  Patient Care Team: Luetta Nutting, DO as PCP - General (Family Medicine) Sueanne Margarita, MD as PCP - Cardiology (Cardiology) Michael Boston, MD as Consulting Physician (General Surgery) Ronnette Juniper, MD as Consulting Physician (Gastroenterology)  Admit date: 11/18/2018  Discharge date: 11/23/2018  Hospital Stay = 5 days    Discharge Diagnoses:  Principal Problem:   Dysphagia Active Problems:   HLD (hyperlipidemia)   Bradycardia   GERD with esophagitis s/p Nissen fundoplication [id number]   Insomnia   Episode of gagging      * No surgery found *  POST-OPERATIVE DIAGNOSIS:   * No surgery found *  SURGERY:  * No surgery found *    SURGEON:    * Surgery not found *  Consults: None  Hospital Course:   The patient underwent fundoplication surgery earlier this month and went home on a pured diet.  He had worsening dysphasia.  He was readmitted and hydrated.  Repeat swallow study showed a tight wrap but no complete obstruction.  He was placed on IV steroids and IV proton pump inhibitors.  His dysphasia abated.  He had issues with fullness retching and globus.  Improved with IV steroids, proton pump inhibitors.  He was given an oral dose of Diflucan for possible thrush.  Gradually he improved.  Was able to re-advance his diet and tolerated a dysphagia 1 diet off all IV medicines and IV fluids.  The surgery above.   Insomnia and other issues abated.  Pain and other symptoms were treated aggressively.    By the time of discharge, the patient was walking well the hallways, eating pured food, having flatus.  Pain was well-controlled on an oral medications.  Based on meeting discharge criteria and continuing to recover, I felt it was safe for the patient to be discharged from the hospital to further recover with close followup. Postoperative recommendations were discussed in  detail.  They are written as well.  Discharged Condition: good  Discharge Exam: Blood pressure 136/78, pulse (!) 42, temperature 98.5 F (36.9 C), temperature source Oral, resp. rate 17, SpO2 98 %.  General: Pt awake/alert/oriented x4 in No acute distress Eyes: PERRL, normal EOM.  Sclera clear.  No icterus Neuro: CN II-XII intact w/o focal sensory/motor deficits. Lymph: No head/neck/groin lymphadenopathy Psych:  No delerium/psychosis/paranoia HENT: Normocephalic, Mucus membranes moist.  No thrush Neck: Supple, No tracheal deviation Chest: No chest wall pain w good excursion CV:  Pulses intact.  Regular rhythm MS: Normal AROM mjr joints.  No obvious deformity Abdomen: Soft.  Nondistended.  Nontender.  No evidence of peritonitis.  No incarcerated hernias. Ext:  SCDs BLE.  No mjr edema.  No cyanosis Skin: No petechiae / purpura   Disposition:   Follow-up Information    Michael Boston, MD. Schedule an appointment as soon as possible for a visit in 3 week(s).   Specialty: General Surgery Contact information: [address] [place] [address] [phone]           Discharge disposition: 01-Home or Self Care       Discharge Instructions    Call MD for:   Complete by: As directed    Temperature > 101.2F   Call MD for:  extreme fatigue   Complete by: As directed    Call MD for:  hives   Complete by: As directed    Call MD for:  persistant nausea and vomiting   Complete by: As directed    Call MD for:  redness, tenderness, or signs of infection (pain, swelling, redness, odor or green/yellow discharge around incision site)   Complete by: As directed    Call MD for:  severe uncontrolled pain   Complete by: As directed    Diet general   Complete by: As directed    SEE ESOPHAGEAL SURGERY DIET INSTRUCTIONS  We using usually start you out on a pureed (blenderized) diet. Expect some sticking with swallowing over the next 1-2 months.   This is due to  swelling around your esophagus at the wrap & hiatal diaphragm repair.  It will gradually ease off over the next few months.   Discharge instructions   Complete by: As directed    Please see discharge instruction sheets.   Also refer to any handouts/printouts that may have been given from the [facility] surgery office (if you visited [place] there before surgery) Please call our office if you have any questions or concerns [phone]   Driving Restrictions   Complete by: As directed    No driving until off narcotics and can safely swerve away without pain during an emergency   Increase activity slowly   Complete by: As directed    Lifting restrictions   Complete by: As directed    Avoid heavy lifting initially, <20 pounds at first.   Do not push through pain.   You have no specific weight limit: If it hurts to do, DON'T DO IT.    If you feel no pain, you are not injuring anything.  Pain will protect you from injury.   Coughing and sneezing are far more stressful to your incision than any lifting.   Avoid resuming heavy lifting (>50 pounds) or other intense activity until off all narcotic pain medications.   When want to exercise more, give yourself 2 weeks to gradually get back to full intense exercise/activity.   May shower / Bathe   Complete by: As directed    [place].  It is fine for dressings or wounds to be washed/rinsed.  Use gentle soap & water.  This will help the incisions and/or wounds get clean & minimize infection.   May walk up steps   Complete by: As directed    Remove dressing in 72 hours   Complete by: As directed    You have closed incisions: Shower and bathe over these incisions with soap and water every day.  It is OK to wash over the dressings: they are waterproof. Remove all surgical dressings on postoperative day #3.  You do not need to replace dressings over the closed incisions unless you feel more comfortable with a Band-Aid covering it.   Please call our  office [phone] if you have further questions.   Sexual Activity Restrictions   Complete by: As directed    Sexual activity as tolerated.  Do not push through pain.  Pain will protect you from injury.   Walk with assistance   Complete by: As directed    Walk over an hour a day.  May use a walker/cane/companion to help with balance and stamina.      Allergies as of 11/23/2018      Reactions   Wasp Venom Anaphylaxis, Hives, Swelling   Oxycodone    "wired & can't sleep"      Medication List    TAKE these medications   acetaminophen 500 MG tablet Commonly known as:  TYLENOL Take 500 mg by mouth 2 (two) times daily as needed for moderate pain or headache.   atorvastatin 40 MG tablet Commonly known as: LIPITOR Take 1 tablet (40 mg total) by mouth every evening. What changed: when to take this   dexlansoprazole 60 MG capsule Commonly known as: Dexilant Take 1 capsule (60 mg total) by mouth daily.   EPINEPHrine 0.3 mg/0.3 mL Soaj injection Commonly known as: EPI-PEN Inject 0.3 mg into the muscle as needed for anaphylaxis.   HYDROcodone-acetaminophen 5-325 MG tablet Commonly known as: NORCO/VICODIN Take 1-2 tablets by mouth every 6 (six) hours as needed for moderate pain or severe pain. What changed: when to take this   ondansetron 4 MG tablet Commonly known as: ZOFRAN Take 1 tablet (4 mg total) by mouth every 8 (eight) hours as needed for nausea.   predniSONE 5 MG/5ML solution Take 10 mLs (10 mg total) by mouth daily with breakfast for 7 days.   promethazine 25 MG suppository Commonly known as: PHENERGAN Place 1 suppository (25 mg total) rectally every 6 (six) hours as needed for nausea.       Significant Diagnostic Studies:  No results found for this or any previous visit (from the past 72 hour(s)).  Dg Esophagus W Single Cm (sol Or Thin Ba)  Result Date: 11/19/2018 CLINICAL DATA:  Here for evaluation of dysphagia. History of Nissen fundoplication on  99991111 for severe esophagitis refractory to medical management. EXAM: ESOPHOGRAM/BARIUM SWALLOW TECHNIQUE: Single contrast examination was performed using water-soluble contrast. FLUOROSCOPY TIME:  Fluoroscopy Time:  1.4 minutes Radiation Exposure Index (if provided by the fluoroscopic device): 9.1 mGy Number of Acquired Spot Images: 0 COMPARISON:  Esophagram dated 11/04/2018. FINDINGS: There is no tracheal aspiration contrast. The esophagus is normal in course. There is narrowing at the site Nissen fundoplication which is 3.6 cm in length. There is pooling of contrast in the esophagus proximal to the narrowing. The gastroesophageal junction is normally positioned. IMPRESSION: Narrowing at the site of Nissen fundoplication measuring 3.6 cm in length with pooling of contrast in the esophagus proximal to the operative site. Electronically Signed   By: Zerita Boers M.D.   On: 11/19/2018 13:57    Past Medical History:  Diagnosis Date   GERD (gastroesophageal reflux disease)     Past Surgical History:  Procedure Laterality Date   BIOPSY  08/17/2018   Procedure: BIOPSY;  Surgeon: Ronnette Juniper, MD;  Location[facility] ENDOSCOPY;  Service: Gastroenterology;;   ESOPHAGEAL MANOMETRY N/A 09/24/2018   Procedure: ESOPHAGEAL MANOMETRY (EM);  Surgeon: Ronnette Juniper, MD;  Location: [facility] ENDOSCOPY;  Service: Gastroenterology;  Laterality: N/A;   ESOPHAGOGASTRODUODENOSCOPY (EGD) WITH PROPOFOL N/A 08/17/2018   Procedure: ESOPHAGOGASTRODUODENOSCOPY (EGD) WITH PROPOFOL;  Surgeon: Ronnette Juniper, MD;  Location: [facility];  Service: Gastroenterology;  Laterality: N/A;   LEFT HEART CATH AND CORONARY ANGIOGRAPHY N/A 08/17/2018   Procedure: LEFT HEART CATH AND CORONARY ANGIOGRAPHY;  Surgeon: Burnell Blanks, MD;  Location: [place] [facility];  Service: Cardiovascular;  Laterality: N/A;    Social History   Socioeconomic History   Marital status: Married    Spouse name: Not on file   Number of children: Not on  file   Years of education: Not on file   Highest education level: Not on file  Occupational History   Not on file  Social Needs   Financial resource strain: Not on file   Food insecurity    Worry: Not on file    Inability: Not on file  Transportation needs    Medical: Not on file    Non-medical: Not on file  Tobacco Use   Smoking status: Never Smoker   Smokeless tobacco: Never Used  Substance and Sexual Activity   Alcohol use: Yes    Comment: occasional    Drug use: Never   Sexual activity: Not on file  Lifestyle   Physical activity    Days per week: Not on file    Minutes per session: Not on file   Stress: Not on file  Relationships   Social connections    Talks on phone: Not on file    Gets together: Not on file    Attends religious service: Not on file    Active member of club or organization: Not on file    Attends meetings of clubs or organizations: Not on file    Relationship status: Not on file   Intimate partner violence    Fear of current or ex partner: Not on file    Emotionally abused: Not on file    Physically abused: Not on file    Forced sexual activity: Not on file  Other Topics Concern   Not on file  Social History Narrative   Not on file    Family History  Problem Relation Age of Onset   GER disease Father     Current Facility-Administered Medications  Medication Dose Route Frequency Provider Last Rate Last Dose   0.9 %  sodium chloride infusion   Intravenous Q8H PRN Taren Toops, Remo Lipps, MD       0.9 %  sodium chloride infusion  250 mL Intravenous PRN Michael Boston, MD       acetaminophen (TYLENOL) solution 650 mg  650 mg Oral Q6H PRN Michael Boston, MD   650 mg at 11/20/18 [id number]   acetaminophen (TYLENOL) suppository 650 mg  650 mg Rectal Q6H PRN Michael Boston, MD       alum & mag hydroxide-simeth (MAALOX/MYLANTA) 200-200-20 MG/5ML suspension 30 mL  30 mL Oral Q6H PRN Michael Boston, MD       atorvastatin (LIPITOR) tablet 40 mg   40 mg Oral Daily Michael Boston, MD   40 mg at 11/22/18 0920   bisacodyl (DULCOLAX) suppository 10 mg  10 mg Rectal Daily PRN Michael Boston, MD       chlorproMAZINE (THORAZINE) 25 mg in sodium chloride 0.9 % 25 mL IVPB  25 mg Intravenous Q6H PRN Michael Boston, MD       diphenhydrAMINE (BENADRYL) 12.5 MG/5ML elixir 12.5 mg  12.5 mg Oral Q6H PRN Michael Boston, MD       Or   diphenhydrAMINE (BENADRYL) injection 12.5 mg  12.5 mg Intravenous Q6H PRN Michael Boston, MD       enoxaparin (LOVENOX) injection 40 mg  40 mg Subcutaneous Q24H Michael Boston, MD   40 mg at 11/22/18 2200   fluconazole (DIFLUCAN) tablet 200 mg  200 mg Oral Once Michael Boston, MD       hydrALAZINE (APRESOLINE) injection 10 mg  10 mg Intravenous Q2H PRN Michael Boston, MD       HYDROcodone-acetaminophen (NORCO/VICODIN) 5-325 MG per tablet 1-2 tablet  1-2 tablet Oral Q6H PRN Michael Boston, MD       HYDROmorphone (DILAUDID) injection 0.5-2 mg  0.5-2 mg Intravenous Q4H PRN Michael Boston, MD   1 mg at 11/22/18 2026   lactated ringers bolus 1,000 mL  1,000 mL Intravenous Once Michael Boston, MD 999 mL/hr at 11/22/18 0800  lip balm (CARMEX) ointment 1 application  1 application Topical BID Michael Boston, MD   1 application at [id number]   LORazepam (ATIVAN) injection 0.5-1 mg  0.5-1 mg Intravenous Q8H PRN Michael Boston, MD       LORazepam (ATIVAN) injection 1 mg  1 mg Sublingual QHS,MR X 1 Aleynah Rocchio, Remo Lipps, MD       magic mouthwash  15 mL Oral QID Michael Boston, MD   15 mL at 11/22/18 1716   menthol-cetylpyridinium (CEPACOL) lozenge 3 mg  1 lozenge Oral PRN Michael Boston, MD       methocarbamol (ROBAXIN) 1,000 mg in dextrose 5 % 50 mL IVPB  1,000 mg Intravenous Q6H PRN Michael Boston, MD 100 mL/hr at 11/22/18 2203 1,000 mg at 11/22/18 2203   metoCLOPramide (REGLAN) injection 5 mg  5 mg Intravenous Q8H PRN Michael Boston, MD       metoprolol tartrate (LOPRESSOR) injection 5 mg  5 mg Intravenous Q6H PRN Michael Boston, MD        naphazoline-glycerin (CLEAR EYES REDNESS) ophth solution 1-2 drop  1-2 drop Both Eyes QID PRN Michael Boston, MD       ondansetron (ZOFRAN-ODT) disintegrating tablet 4 mg  4 mg Oral Q6H PRN Michael Boston, MD   4 mg at 11/18/18 1753   Or   ondansetron (ZOFRAN) injection 4 mg  4 mg Intravenous Q6H PRN Michael Boston, MD   4 mg at 11/21/18 1657   pantoprazole (PROTONIX) EC tablet 40 mg  40 mg Oral BID Rogelia Mire, MD   40 mg at 11/23/18 0800   predniSONE 5 MG/5ML solution 10 mg  10 mg Oral BID WC Michael Boston, MD   10 mg at 11/23/18 0801   prochlorperazine (COMPAZINE) injection 5-10 mg  5-10 mg Intravenous Q6H PRN Michael Boston, MD   10 mg at 11/20/18 1807   simethicone (MYLICON) 40 [id number] suspension 40 mg  40 mg Oral QID PRN Michael Boston, MD       sodium chloride flush (NS) 0.9 % injection 3 mL  3 mL Intravenous Gorden Harms, MD   3 mL at 11/22/18 2204   sodium chloride flush (NS) 0.9 % injection 3 mL  3 mL Intravenous PRN Michael Boston, MD       zolpidem (AMBIEN) tablet 5-10 mg  5-10 mg Oral QHS PRN Michael Boston, MD       Facility-Administered Medications Ordered in Other Encounters  Medication Dose Route Frequency Provider Last Rate Last Dose   0.9 %  sodium chloride infusion   Intravenous Continuous Ronnette Juniper, MD         Allergies  Allergen Reactions   Wasp Venom Anaphylaxis, Hives and Swelling   Oxycodone     "wired & can't sleep"    Signed: Morton Peters, MD, FACS, MASCRS Gastrointestinal and Minimally Invasive Surgery    [address] [place], [place] [address] [phone] Main / Paging [phone] Fax   11/23/2018, 8:30 AM

## 2018-11-23 NOTE — Progress Notes (Signed)
Discharge instructions given to pt and all questions were answered.  

## 2018-11-24 ENCOUNTER — Other Ambulatory Visit: Payer: BC Managed Care – PPO

## 2018-12-21 DIAGNOSIS — D2271 Melanocytic nevi of right lower limb, including hip: Secondary | ICD-10-CM | POA: Diagnosis not present

## 2018-12-21 DIAGNOSIS — D225 Melanocytic nevi of trunk: Secondary | ICD-10-CM | POA: Diagnosis not present

## 2018-12-21 DIAGNOSIS — L814 Other melanin hyperpigmentation: Secondary | ICD-10-CM | POA: Diagnosis not present

## 2018-12-21 DIAGNOSIS — L821 Other seborrheic keratosis: Secondary | ICD-10-CM | POA: Diagnosis not present

## 2019-01-14 DIAGNOSIS — L600 Ingrowing nail: Secondary | ICD-10-CM | POA: Diagnosis not present

## 2019-01-14 DIAGNOSIS — L03032 Cellulitis of left toe: Secondary | ICD-10-CM | POA: Diagnosis not present

## 2019-01-17 DIAGNOSIS — L600 Ingrowing nail: Secondary | ICD-10-CM | POA: Insufficient documentation

## 2019-01-18 ENCOUNTER — Other Ambulatory Visit: Payer: Self-pay | Admitting: Surgery

## 2019-01-18 DIAGNOSIS — R142 Eructation: Secondary | ICD-10-CM

## 2019-01-25 DIAGNOSIS — K219 Gastro-esophageal reflux disease without esophagitis: Secondary | ICD-10-CM | POA: Diagnosis not present

## 2019-01-25 DIAGNOSIS — Z131 Encounter for screening for diabetes mellitus: Secondary | ICD-10-CM | POA: Diagnosis not present

## 2019-01-25 DIAGNOSIS — E559 Vitamin D deficiency, unspecified: Secondary | ICD-10-CM | POA: Diagnosis not present

## 2019-01-25 DIAGNOSIS — E60 Dietary zinc deficiency: Secondary | ICD-10-CM | POA: Diagnosis not present

## 2019-01-25 DIAGNOSIS — R5383 Other fatigue: Secondary | ICD-10-CM | POA: Diagnosis not present

## 2019-01-25 DIAGNOSIS — E291 Testicular hypofunction: Secondary | ICD-10-CM | POA: Diagnosis not present

## 2019-02-07 ENCOUNTER — Other Ambulatory Visit: Payer: Self-pay

## 2019-02-08 ENCOUNTER — Ambulatory Visit (INDEPENDENT_AMBULATORY_CARE_PROVIDER_SITE_OTHER): Payer: 59 | Admitting: Family Medicine

## 2019-02-08 ENCOUNTER — Encounter: Payer: Self-pay | Admitting: Family Medicine

## 2019-02-08 DIAGNOSIS — L03113 Cellulitis of right upper limb: Secondary | ICD-10-CM | POA: Diagnosis not present

## 2019-02-08 MED ORDER — CEPHALEXIN 500 MG PO CAPS: 500.0000 mg | ORAL_CAPSULE | Freq: Four times a day (QID) | ORAL | 0 refills | Status: AC

## 2019-02-08 NOTE — Progress Notes (Signed)
Travis Kaufman - 48 y.o. male MRN [id number]  Date of birth: Mar 25, 1970  Subjective Chief Complaint  Patient presents with  . Foreign Body in Skin    x 2days.  Pt c/o of rt wrist swelling     HPI Travis Kaufman is a 48 y.o. male here today with complaint of R wrist pain.  He reports that he was working on flipping a house a few days ago and got a splinter in his R wrist.  He believes he removed all of the splinter.  He started having swelling yesterday with pain in his wrist.  He has some tightness with grip strength and flexing wrist.  He has not had fever or chills.    ROS:  A comprehensive ROS was completed and negative except as noted per HPI  Allergies  Allergen Reactions  . Wasp Venom Anaphylaxis, Hives and Swelling  . Oxycodone     "wired & can't sleep"    Past Medical History:  Diagnosis Date  . GERD (gastroesophageal reflux disease)     Past Surgical History:  Procedure Laterality Date  . BIOPSY  08/17/2018   Procedure: BIOPSY;  Surgeon: Ronnette Juniper, MD;  Location: [facility] ENDOSCOPY;  Service: Gastroenterology;;  . ESOPHAGEAL MANOMETRY N/A 09/24/2018   Procedure: ESOPHAGEAL MANOMETRY (EM);  Surgeon: Ronnette Juniper, MD;  Location: [facility] ENDOSCOPY;  Service: Gastroenterology;  Laterality: N/A;  . ESOPHAGOGASTRODUODENOSCOPY (EGD) WITH PROPOFOL N/A 08/17/2018   Procedure: ESOPHAGOGASTRODUODENOSCOPY (EGD) WITH PROPOFOL;  Surgeon: Ronnette Juniper, MD;  Location: [place];  Service: Gastroenterology;  Laterality: N/A;  . LEFT HEART CATH AND CORONARY ANGIOGRAPHY N/A 08/17/2018   Procedure: LEFT HEART CATH AND CORONARY ANGIOGRAPHY;  Surgeon: Burnell Blanks, MD;  Location: [place] [facility];  Service: Cardiovascular;  Laterality: N/A;    Social History   Socioeconomic History  . Marital status: Married    Spouse name: Not on file  . Number of children: Not on file  . Years of education: Not on file  . Highest education level: Not on file  Occupational History  . Not on  file  Social Needs  . Financial resource strain: Not on file  . Food insecurity    Worry: Not on file    Inability: Not on file  . Transportation needs    Medical: Not on file    Non-medical: Not on file  Tobacco Use  . Smoking status: Never Smoker  . Smokeless tobacco: Never Used  Substance and Sexual Activity  . Alcohol use: Yes    Comment: occasional   . Drug use: Never  . Sexual activity: Not on file  Lifestyle  . Physical activity    Days per week: Not on file    Minutes per session: Not on file  . Stress: Not on file  Relationships  . Social Herbalist on phone: Not on file    Gets together: Not on file    Attends religious service: Not on file    Active member of club or organization: Not on file    Attends meetings of clubs or organizations: Not on file    Relationship status: Not on file  Other Topics Concern  . Not on file  Social History Narrative  . Not on file    Family History  Problem Relation Age of Onset  . GER disease Father     Health Maintenance  Topic Date Due  . TETANUS/TDAP  05/19/1989  . INFLUENZA VACCINE  10/02/2018  .  HIV Screening  Completed    ----------------------------------------------------------------------------------------------------------------------------------------------------------------------------------------------------------------- Physical Exam BP 100/80 (BP Location: Right Arm, Patient Position: Sitting, Cuff Size: Normal)   Pulse 60   Temp 97.9 F (36.6 C) (Temporal)   Ht 5\' 11"  (1.803 m)   Wt 175 lb (79.4 kg)   SpO2 98%   BMI 24.41 kg/m   Physical Exam Constitutional:      Appearance: Normal appearance.  HENT:     Head: Normocephalic and atraumatic.  Eyes:     General: No scleral icterus. Skin:    Comments: Small puncture wound on R wrist with mild erythema and warmth.  No joint tenderness. He does have some ttp of the overlying soft tissues.  ROM of wrist is normal but with some pain.   Decreased grip ROM.    Neurological:     General: No focal deficit present.     Mental Status: He is alert.  Psychiatric:        Mood and Affect: Mood normal.        Behavior: Behavior normal.     ------------------------------------------------------------------------------------------------------------------------------------------------------------------------------------------------------------------- Assessment and Plan  Cellulitis of right wrist 2/2 to foreign body.  He believes he has removed foreign body in entirety.  He is fairly certain his tetanus status is UTD.  Start cephalexin 500mg  QID.  He does have some mild difficulty with making a fist.  I think it involves the soft tissues more so than being a tenosynovitis but discussed if having any worsening or not seeing some improvement in the next 24-36 hours to let me know.

## 2019-02-08 NOTE — Assessment & Plan Note (Signed)
2/2 to foreign body.  He believes he has removed foreign body in entirety.  He is fairly certain his tetanus status is UTD.  Start cephalexin 500mg  QID.  He does have some mild difficulty with making a fist.  I think it involves the soft tissues more so than being a tenosynovitis but discussed if having any worsening or not seeing some improvement in the next 24-36 hours to let me know.

## 2019-02-08 NOTE — Patient Instructions (Signed)
Start antibiotic today.  If you notice increase swelling, pain, fever, chills, streaking up the arm please let me know!

## 2019-04-09 ENCOUNTER — Other Ambulatory Visit: Payer: Self-pay

## 2019-04-09 ENCOUNTER — Observation Stay (HOSPITAL_BASED_OUTPATIENT_CLINIC_OR_DEPARTMENT_OTHER)
Admission: EM | Admit: 2019-04-09 | Discharge: 2019-04-11 | Disposition: A | Discharge status: Home / Self Care | Payer: 59 | Attending: Internal Medicine | Admitting: Internal Medicine

## 2019-04-09 ENCOUNTER — Encounter (HOSPITAL_BASED_OUTPATIENT_CLINIC_OR_DEPARTMENT_OTHER): Payer: Self-pay | Admitting: *Deleted

## 2019-04-09 DIAGNOSIS — I4891 Unspecified atrial fibrillation: Secondary | ICD-10-CM | POA: Diagnosis present

## 2019-04-09 DIAGNOSIS — Z20822 Contact with and (suspected) exposure to covid-19: Secondary | ICD-10-CM | POA: Insufficient documentation

## 2019-04-09 DIAGNOSIS — E785 Hyperlipidemia, unspecified: Secondary | ICD-10-CM | POA: Diagnosis not present

## 2019-04-09 DIAGNOSIS — R000 Tachycardia, unspecified: Secondary | ICD-10-CM | POA: Diagnosis present

## 2019-04-09 DIAGNOSIS — I480 Paroxysmal atrial fibrillation: Principal | ICD-10-CM | POA: Insufficient documentation

## 2019-04-09 LAB — BASIC METABOLIC PANEL
Anion gap: 14 (ref 5–15)
BUN: 17 mg/dL (ref 6–20)
CO2: 23 mmol/L (ref 22–32)
Calcium: 10 mg/dL (ref 8.9–10.3)
Chloride: 102 mmol/L (ref 98–111)
Creatinine, Ser: 0.98 mg/dL (ref 0.61–1.24)
GFR calc Af Amer: >60 mL/min (ref >60)
GFR calc non Af Amer: >60 mL/min (ref >60)
Glucose, Bld: 101 mg/dL — ABNORMAL HIGH (ref 70–99)
Potassium: 3.5 mmol/L (ref 3.5–5.1)
Sodium: 139 mmol/L (ref 135–145)

## 2019-04-09 LAB — CBC WITH DIFFERENTIAL/PLATELET
Abs Immature Granulocytes: 0.03 10*3/uL (ref 0.00–0.07)
Basophils Absolute: 0.1 10*3/uL (ref 0.0–0.1)
Basophils Relative: 1 %
Eosinophils Absolute: 0.3 10*3/uL (ref 0.0–0.5)
Eosinophils Relative: 3 %
HCT: 48.7 % (ref 39.0–52.0)
Hemoglobin: 16.7 g/dL (ref 13.0–17.0)
Immature Granulocytes: 0 %
Lymphocytes Relative: 28 %
Lymphs Abs: 3.5 10*3/uL (ref 0.7–4.0)
MCH: 31.2 pg (ref 26.0–34.0)
MCHC: 34.3 g/dL (ref 30.0–36.0)
MCV: 90.9 fL (ref 80.0–100.0)
Monocytes Absolute: 1.2 10*3/uL — ABNORMAL HIGH (ref 0.1–1.0)
Monocytes Relative: 10 %
Neutro Abs: 7.3 10*3/uL (ref 1.7–7.7)
Neutrophils Relative %: 58 %
Platelets: 317 10*3/uL (ref 150–400)
RBC: 5.36 MIL/uL (ref 4.22–5.81)
RDW: 13.2 % (ref 11.5–15.5)
WBC: 12.4 10*3/uL — ABNORMAL HIGH (ref 4.0–10.5)
nRBC: 0 % (ref 0.0–0.2)

## 2019-04-09 MED ORDER — METOPROLOL TARTRATE 5 MG/5ML IV SOLN
INTRAVENOUS | Status: AC
  Filled 2019-04-09: qty 5

## 2019-04-09 MED ORDER — METOPROLOL TARTRATE 5 MG/5ML IV SOLN
5.0000 mg | Freq: Once | INTRAVENOUS | Status: AC
  Administered 2019-04-09: 5 mg via INTRAVENOUS

## 2019-04-09 MED ORDER — DILTIAZEM HCL-DEXTROSE 125-5 MG/125ML-% IV SOLN (PREMIX)
5.0000 mg/h | INTRAVENOUS | Status: DC
  Administered 2019-04-09: 5 mg/h via INTRAVENOUS
  Filled 2019-04-09: qty 125

## 2019-04-09 MED ORDER — DILTIAZEM LOAD VIA INFUSION
10.0000 mg | Freq: Once | INTRAVENOUS | Status: AC
  Administered 2019-04-09: 10 mg via INTRAVENOUS
  Filled 2019-04-09: qty 10

## 2019-04-09 NOTE — ED Triage Notes (Signed)
Pt reports heart racing and palpitations x 1 hour. States heart rate has been irregular. His smart watch has been reading HR in the 190s. Pt alert, denies pain. States has had similar episode in the past which resolved on it's own

## 2019-04-09 NOTE — ED Notes (Addendum)
EDP at bedside and discussing care with patient and family by phone.

## 2019-04-09 NOTE — ED Notes (Signed)
ED Provider at bedside. 

## 2019-04-09 NOTE — ED Provider Notes (Signed)
[facility] Provider Note   CSN: [id number] Arrival date & time: 04/09/19  2029     History Chief Complaint  Patient presents with  . Tachycardia    Travis Kaufman is a 49 y.o. male.  Patient is a 50 year old male with a history of GERD and dysphagia who presents with rapid heart rate.  He says about an hour and a half prior to arrival he started feeling his heart was racing.  He feels it in his neck.  He denies any chest pain or shortness of breath.  No dizziness.  He is noted from his watch that his heart rate has been going between 1 70-1 90.  He had 1 similar episode in the past that resolved on its own without treatment.  No known history of atrial fibrillation or SVT.  He is not on anticoagulants.  Prior to an hour and a half ago when it started he was feeling fine with no palpitations or other symptoms.  He did have a recent [employer] fundoplication due to reflux which was felt to be contributing to chest pain he been having.        Past Medical History:  Diagnosis Date  . GERD (gastroesophageal reflux disease)     Patient Active Problem List   Diagnosis Date Noted  . Atrial fibrillation with RVR ([place]) 04/09/2019  . Cellulitis of right wrist 02/08/2019  . Dysphagia 11/18/2018  . Insomnia 11/18/2018  . Episode of gagging 11/18/2018  . GERD with esophagitis s/p Nissen fundoplication [id number]  . AKI (acute kidney injury) ([place]) 08/17/2018  . Bradycardia 08/17/2018  . Chest pain   . Angina at rest [facility]) 08/15/2018  . HLD (hyperlipidemia) 07/13/2018  . Nevus 07/13/2018  . Viral upper respiratory tract infection 01/07/2018  . Decreased libido without sexual dysfunction 01/07/2018  . GERD (gastroesophageal reflux disease) 01/07/2018    Past Surgical History:  Procedure Laterality Date  . BIOPSY  08/17/2018   Procedure: BIOPSY;  Surgeon: Ronnette Juniper, MD;  Location: [facility] ENDOSCOPY;  Service: Gastroenterology;;  . ESOPHAGEAL  MANOMETRY N/A 09/24/2018   Procedure: ESOPHAGEAL MANOMETRY (EM);  Surgeon: Ronnette Juniper, MD;  Location: [facility] ENDOSCOPY;  Service: Gastroenterology;  Laterality: N/A;  . ESOPHAGOGASTRODUODENOSCOPY (EGD) WITH PROPOFOL N/A 08/17/2018   Procedure: ESOPHAGOGASTRODUODENOSCOPY (EGD) WITH PROPOFOL;  Surgeon: Ronnette Juniper, MD;  Location: [facility];  Service: Gastroenterology;  Laterality: N/A;  . LEFT HEART CATH AND CORONARY ANGIOGRAPHY N/A 08/17/2018   Procedure: LEFT HEART CATH AND CORONARY ANGIOGRAPHY;  Surgeon: Burnell Blanks, MD;  Location: [place] CV LAB;  Service: Cardiovascular;  Laterality: N/A;       Family History  Problem Relation Age of Onset  . GER disease Father     Social History   Tobacco Use  . Smoking status: Never Smoker  . Smokeless tobacco: Never Used  Substance Use Topics  . Alcohol use: Yes    Comment: occasional   . Drug use: Never    Home Medications Prior to Admission medications   Medication Sig Start Date End Date Taking? Authorizing Provider  acetaminophen (TYLENOL) 500 MG tablet Take 500 mg by mouth 2 (two) times daily as needed for moderate pain or headache.    [provider]  dexlansoprazole (DEXILANT) 60 MG capsule Take 1 capsule (60 mg total) by mouth daily. Patient not taking: Reported on 11/18/2018 07/13/18   Luetta Nutting, DO  EPINEPHrine 0.3 mg/0.3 mL IJ SOAJ injection Inject 0.3 mg into the muscle as needed  for anaphylaxis.  09/17/17   [provider]  HYDROcodone-acetaminophen (NORCO/VICODIN) 5-325 MG tablet Take 1-2 tablets by mouth every 6 (six) hours as needed for moderate pain or severe pain. Patient not taking: Reported on 02/08/2019 11/05/18   Michael Boston, MD    Allergies    Wasp venom and Oxycodone  Review of Systems   Review of Systems  Constitutional: Negative for chills, diaphoresis, fatigue and fever.  HENT: Negative for congestion, rhinorrhea and sneezing.   Eyes: Negative.   Respiratory: Negative for  cough, chest tightness and shortness of breath.   Cardiovascular: Positive for palpitations. Negative for chest pain and leg swelling.  Gastrointestinal: Negative for abdominal pain, blood in stool, diarrhea, nausea and vomiting.  Genitourinary: Negative for difficulty urinating, flank pain, frequency and hematuria.  Musculoskeletal: Negative for arthralgias and back pain.  Skin: Negative for rash.  Neurological: Negative for dizziness, speech difficulty, weakness, numbness and headaches.    Physical Exam Updated Vital Signs BP 106/67 (BP Location: Right Arm)   Pulse 94   Temp 97.9 F (36.6 C) (Oral)   Resp (!) 21   Ht 5\' 11"  (1.803 m)   Wt 77.1 kg   SpO2 98%   BMI 23.71 kg/m   Physical Exam Constitutional:      Appearance: He is well-developed.  HENT:     Head: Normocephalic and atraumatic.  Eyes:     Pupils: Pupils are equal, round, and reactive to light.  Cardiovascular:     Rate and Rhythm: Regular rhythm. Tachycardia present.     Heart sounds: Normal heart sounds.  Pulmonary:     Effort: Pulmonary effort is normal. No respiratory distress.     Breath sounds: Normal breath sounds. No wheezing or rales.  Chest:     Chest wall: No tenderness.  Abdominal:     General: Bowel sounds are normal.     Palpations: Abdomen is soft.     Tenderness: There is no abdominal tenderness. There is no guarding or rebound.  Musculoskeletal:        General: Normal range of motion.     Cervical back: Normal range of motion and neck supple.  Lymphadenopathy:     Cervical: No cervical adenopathy.  Skin:    General: Skin is warm and dry.     Findings: No rash.  Neurological:     Mental Status: He is alert and oriented to person, place, and time.     ED Results / Procedures / Treatments   Labs (all labs ordered are listed, but only abnormal results are displayed) Labs Reviewed  BASIC METABOLIC PANEL - Abnormal; Notable for the following components:      Result Value   Glucose,  Bld 101 (*)    All other components within normal limits  CBC WITH DIFFERENTIAL/PLATELET - Abnormal; Notable for the following components:   WBC 12.4 (*)    Monocytes Absolute 1.2 (*)    All other components within normal limits  SARS CORONAVIRUS 2 (TAT 6-24 HRS)    EKG EKG Interpretation  Date/Time:  Saturday April 09 2019 20:37:27 EST Ventricular Rate:  172 PR Interval:    QRS Duration: 73 QT Interval:  265 QTC Calculation: 468 R Axis:   62 Text Interpretation: Atrial fibrillation with rapid V-rate Repolarization abnormality, prob rate related Confirmed by Malvin Johns [phone]) on 04/09/2019 8:41:12 PM   Radiology No results found.  Procedures Procedures (including critical care time)  Medications Ordered in ED Medications  metoprolol tartrate (LOPRESSOR) 5  MG/5ML injection (has no administration in time range)  diltiazem (CARDIZEM) 1 mg/mL load via infusion 10 mg (10 mg Intravenous Bolus from Bag 04/09/19 2143)    And  diltiazem (CARDIZEM) 125 mg in dextrose 5% 125 mL (1 mg/mL) infusion (10 mg/hr Intravenous Rate/Dose Change 04/09/19 2157)  metoprolol tartrate (LOPRESSOR) injection 5 mg (5 mg Intravenous Given 04/09/19 2102)    ED Course  I have reviewed the triage vital signs and the nursing notes.  Pertinent labs & imaging results that were available during my care of the patient were reviewed by me and considered in my medical decision making (see chart for details).   Patient presented to the ED in new onset atrial fibrillation with RVR.  He felt it started about an hour and a half prior to arrival.  He was given dose of Lopressor in the ED which brought his heart rate down a little bit but not significantly.  Given that his symptoms had just started within the last 2 hours, I felt he was a good candidate for cardioversion.  I discussed this option with the patient and his wife.  Patient is very anxious about it and at this point does not want to undergo cardioversion.  I  discussed with him the other option would be admitting him to the hospital for rate control and cardiology evaluation.  He is more amenable to this.  He was started on Cardizem by IV bolus and drip.  This did control his heart rate and brought it down into the lower 100s.  I spoke with Dr. Launa Grill with cardiology who is excepted the patient for admission to [facility].  CRITICAL CARE Performed by: Malvin Johns Total critical care time: 45 minutes Critical care time was exclusive of separately billable procedures and treating other patients. Critical care was necessary to treat or prevent imminent or life-threatening deterioration. Critical care was time spent personally by me on the following activities: development of treatment plan with patient and/or surrogate as well as nursing, discussions with consultants, evaluation of patient's response to treatment, examination of patient, obtaining history from patient or surrogate, ordering and performing treatments and interventions, ordering and review of laboratory studies, ordering and review of radiographic studies, pulse oximetry and re-evaluation of patient's condition.  MDM Rules/Calculators/A&P                      CHA2DS2-VASc Score = 0 The patient's score is based upon: CHF History: No HTN History: No Age : < 65 Diabetes History: No Stroke History: No Vascular Disease History: No Gender: Male      ASSESSMENT AND PLAN: Persistent Atrial Fibrillation (ICD10:  I48.19) The patient's CHA2DS2-VASc score is 0, indicating a 0.2% annual risk of stroke.  0      Signed,  Malvin Johns, MD    04/09/2019 10:38 PM    Final Clinical Impression(s) / ED Diagnoses Final diagnoses:  Atrial fibrillation with RVR [facility])    Rx / DC Orders ED Discharge Orders    None       Malvin Johns, MD 04/09/19 2241

## 2019-04-10 ENCOUNTER — Encounter (HOSPITAL_BASED_OUTPATIENT_CLINIC_OR_DEPARTMENT_OTHER): Payer: Self-pay | Admitting: Cardiology

## 2019-04-10 DIAGNOSIS — I480 Paroxysmal atrial fibrillation: Secondary | ICD-10-CM | POA: Diagnosis not present

## 2019-04-10 DIAGNOSIS — E785 Hyperlipidemia, unspecified: Secondary | ICD-10-CM | POA: Diagnosis not present

## 2019-04-10 LAB — COMPREHENSIVE METABOLIC PANEL
ALT: 20 U/L (ref 0–44)
AST: 18 U/L (ref 15–41)
Albumin: 3.7 g/dL (ref 3.5–5.0)
Alkaline Phosphatase: 68 U/L (ref 38–126)
Anion gap: 11 (ref 5–15)
BUN: 13 mg/dL (ref 6–20)
CO2: 25 mmol/L (ref 22–32)
Calcium: 9.2 mg/dL (ref 8.9–10.3)
Chloride: 103 mmol/L (ref 98–111)
Creatinine, Ser: 0.98 mg/dL (ref 0.61–1.24)
GFR calc Af Amer: >60 mL/min (ref >60)
GFR calc non Af Amer: >60 mL/min (ref >60)
Glucose, Bld: 136 mg/dL — ABNORMAL HIGH (ref 70–99)
Potassium: 3.7 mmol/L (ref 3.5–5.1)
Sodium: 139 mmol/L (ref 135–145)
Total Bilirubin: 0.9 mg/dL (ref 0.3–1.2)
Total Protein: 6.2 g/dL — ABNORMAL LOW (ref 6.5–8.1)

## 2019-04-10 LAB — CBC
HCT: 45 % (ref 39.0–52.0)
Hemoglobin: 15.2 g/dL (ref 13.0–17.0)
MCH: 31 pg (ref 26.0–34.0)
MCHC: 33.8 g/dL (ref 30.0–36.0)
MCV: 91.6 fL (ref 80.0–100.0)
Platelets: 292 10*3/uL (ref 150–400)
RBC: 4.91 MIL/uL (ref 4.22–5.81)
RDW: 13.3 % (ref 11.5–15.5)
WBC: 7 10*3/uL (ref 4.0–10.5)
nRBC: 0 % (ref 0.0–0.2)

## 2019-04-10 LAB — LIPID PANEL
Cholesterol: 235 mg/dL — ABNORMAL HIGH (ref 0–200)
HDL: 59 mg/dL (ref >40)
LDL Cholesterol: 161 mg/dL — ABNORMAL HIGH (ref 0–99)
Total CHOL/HDL Ratio: 4 RATIO
Triglycerides: 74 mg/dL (ref <150)
VLDL: 15 mg/dL (ref 0–40)

## 2019-04-10 LAB — SARS CORONAVIRUS 2 (TAT 6-24 HRS): SARS Coronavirus 2: NEGATIVE

## 2019-04-10 LAB — TSH: TSH: 0.689 u[IU]/mL (ref 0.350–4.500)

## 2019-04-10 LAB — HEMOGLOBIN A1C
Hgb A1c MFr Bld: 5.2 % (ref 4.8–5.6)
Mean Plasma Glucose: 102.54 mg/dL

## 2019-04-10 LAB — T4, FREE: Free T4: 0.9 ng/dL (ref 0.61–1.12)

## 2019-04-10 MED ORDER — DILTIAZEM HCL 60 MG PO TABS
30.0000 mg | ORAL_TABLET | Freq: Four times a day (QID) | ORAL | Status: DC
  Administered 2019-04-10: 30 mg via ORAL
  Filled 2019-04-10: qty 1

## 2019-04-10 MED ORDER — ACETAMINOPHEN 325 MG PO TABS: 650.0000 mg | ORAL_TABLET | ORAL | Status: DC | PRN

## 2019-04-10 MED ORDER — APIXABAN 5 MG PO TABS: 5.0000 mg | ORAL_TABLET | Freq: Two times a day (BID) | ORAL | Status: DC

## 2019-04-10 MED ORDER — DILTIAZEM HCL-DEXTROSE 125-5 MG/125ML-% IV SOLN (PREMIX): 5.0000 mg/h | INTRAVENOUS | Status: DC

## 2019-04-10 MED ORDER — ONDANSETRON HCL 4 MG/2ML IJ SOLN: 4.0000 mg | Freq: Four times a day (QID) | INTRAMUSCULAR | Status: DC | PRN

## 2019-04-10 NOTE — Progress Notes (Deleted)
Pt came in Afib with RVR and just converted to sinus rhythm. Pt in sinus brady,HR in the 40's-50's and pt stated that his baseline HR in the 40's and SBP is running in the 90's-100's. MD is aware.

## 2019-04-10 NOTE — Progress Notes (Signed)
Pt has been sleeping most of the day.  Idolina Primer, RN

## 2019-04-10 NOTE — H&P (Addendum)
Cardiology Admission History and Physical:   Patient ID: Travis Kaufman MRN: [id number]; DOB: 01/26/1971   Admission date: 04/09/2019  Primary Care Provider: Luetta Nutting, DO Primary Cardiologist: Fransico Him, MD  Primary Electrophysiologist:  None   Chief Complaint:  palpitations  Patient Profile:   Travis Kaufman is a 49 y.o. male with severe GERD s/p Nissen Fundoplication in [id number] admitted for new diagnosis atrial fibrillation with RVR.   History of Present Illness:   Travis Kaufman is at baseline quite active. 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 He has felt run down for the past couple weeks. Yesterday, he felt a fluttering from his chest that radiated to his neck. He put on his smart watch and heart rate was ~200. He did not have chest pain or dyspnea associated with this. On arrival to [place] ED, HR was 170s with normal blood pressure. Cardioversion was offered but declined. He received IV metoprolol and was started on diltiazem drip with good rate control.   He has no known structural heart disease. He was evaluated with echo and coronary angiography last summer due to chest pain symptoms, both of which were normal. Chest pain was ultimately attributed to GERD. He drinks about 3 glasses of wine per week and avoids caffeine. He does not snore and has never been evaluated for sleep apnea. He has not been diagnosed with thyroid disorder and had never had palpitations prior to today.   On arrival to [facility], he was transitioned from diltiazem drip to oral short-acting diltiazem. Around 06:00, he self-converted to sinus bradycardia. He is not symptomatic of bradycardia.   Heart Pathway Score:     Past Medical History:  Diagnosis Date  . GERD (gastroesophageal reflux disease)     Past Surgical History:  Procedure Laterality Date  . BIOPSY  08/17/2018   Procedure: BIOPSY;  Surgeon: Ronnette Juniper, MD;  Location: [facility] ENDOSCOPY;  Service:  Gastroenterology;;  . ESOPHAGEAL MANOMETRY N/A 09/24/2018   Procedure: ESOPHAGEAL MANOMETRY (EM);  Surgeon: Ronnette Juniper, MD;  Location: [facility] ENDOSCOPY;  Service: Gastroenterology;  Laterality: N/A;  . ESOPHAGOGASTRODUODENOSCOPY (EGD) WITH PROPOFOL N/A 08/17/2018   Procedure: ESOPHAGOGASTRODUODENOSCOPY (EGD) WITH PROPOFOL;  Surgeon: Ronnette Juniper, MD;  Location: [place];  Service: Gastroenterology;  Laterality: N/A;  . LEFT HEART CATH AND CORONARY ANGIOGRAPHY N/A 08/17/2018   Procedure: LEFT HEART CATH AND CORONARY ANGIOGRAPHY;  Surgeon: Burnell Blanks, MD;  Location: [place] CV LAB;  Service: Cardiovascular;  Laterality: N/A;  . NISSEN FUNDOPLICATION       Medications Prior to Admission: Prior to Admission medications   Medication Sig Start Date End Date Taking? Authorizing Provider  acetaminophen (TYLENOL) 500 MG tablet Take 500 mg by mouth 2 (two) times daily as needed for moderate pain or headache.    [provider]  dexlansoprazole (DEXILANT) 60 MG capsule Take 1 capsule (60 mg total) by mouth daily. Patient not taking: Reported on 11/18/2018 07/13/18   Luetta Nutting, DO  EPINEPHrine 0.3 mg/0.3 mL IJ SOAJ injection Inject 0.3 mg into the muscle as needed for anaphylaxis.  09/17/17   [provider]  HYDROcodone-acetaminophen (NORCO/VICODIN) 5-325 MG tablet Take 1-2 tablets by mouth every 6 (six) hours as needed for moderate pain or severe pain. Patient not taking: Reported on 02/08/2019 11/05/18   Michael Boston, MD     Allergies:    Allergies  Allergen Reactions  . Wasp Venom Anaphylaxis, Hives and Swelling  . Oxycodone     "  wired & can't sleep"    Social History:   Social History   Socioeconomic History  . Marital status: Married    Spouse name: Not on file  . Number of children: Not on file  . Years of education: Not on file  . Highest education level: Not on file  Occupational History  . Not on file  Tobacco Use  . Smoking status: Never Smoker   . Smokeless tobacco: Never Used  Substance and Sexual Activity  . Alcohol use: Yes    Comment: occasional   . Drug use: Never  . Sexual activity: Not on file  Other Topics Concern  . Not on file  Social History Narrative  . Not on file   Social Determinants of Health   Financial Resource Strain:   . Difficulty of Paying Living Expenses: Not on file  Food Insecurity:   . Worried About Charity fundraiser in the Last Year: Not on file  . Ran Out of Food in the Last Year: Not on file  Transportation Needs:   . Lack of Transportation (Medical): Not on file  . Lack of Transportation (Non-Medical): Not on file  Physical Activity:   . Days of Exercise per Week: Not on file  . Minutes of Exercise per Session: Not on file  Stress:   . Feeling of Stress : Not on file  Social Connections:   . Frequency of Communication with Friends and Family: Not on file  . Frequency of Social Gatherings with Friends and Family: Not on file  . Attends Religious Services: Not on file  . Active Member of Clubs or Organizations: Not on file  . Attends Archivist Meetings: Not on file  . Marital Status: Not on file  Intimate Partner Violence:   . Fear of Current or Ex-Partner: Not on file  . Emotionally Abused: Not on file  . Physically Abused: Not on file  . Sexually Abused: Not on file    Family History:   The patient's family history includes GER disease in his father.    ROS:  Please see the history of present illness. All other ROS reviewed and negative.     Physical Exam/Data:   Vitals:   04/10/19 0318 04/10/19 0323 04/10/19 0329 04/10/19 0416  BP:  91/73  112/71  Pulse: 85 (!) 55 62 62  Resp: 13 13 17  (!) 21  Temp:    97.9 F (36.6 C)  TempSrc:    Oral  SpO2: 98% 99% 99% 98%  Weight:      Height:        Intake/Output Summary (Last 24 hours) at 04/10/2019 0555 Last data filed at 04/09/2019 2100 Gross per 24 hour  Intake -  Output 400 ml  Net -400 ml   Last 3 Weights  04/09/2019 02/08/2019 11/03/2018  Weight (lbs) 170 lb 175 lb 185 lb  Weight (kg) 77.111 kg 79.379 kg 83.915 kg     Body mass index is 23.71 kg/m.  General:  Well nourished, well developed, in no acute distress HEENT: normal Lymph: no adenopathy Neck: no JVD Endocrine:  No thryomegaly Vascular: No carotid bruits; FA pulses 2+ bilaterally without bruits  Cardiac:  normal S1, S2; RRR; no murmur  Lungs:  clear to auscultation bilaterally, no wheezing, rhonchi or rales  Abd: soft, nontender, no hepatomegaly  Ext: no edema Musculoskeletal:  No deformities, BUE and BLE strength normal and equal Skin: warm and dry  Neuro:  CNs 2-12 intact, no focal  abnormalities noted Psych:  Normal affect   EKG:  The ECG that was done 04/09/2019 was personally reviewed and demonstrates atrial fibrillation with HR 170. On 04/10/19, sinus bradycardia with HR 40.   Relevant CV Studies:  TTE 08/15/18 1. The left ventricle has hyperdynamic systolic function, with an  ejection fraction of >65%. The cavity size was normal. Left ventricular  diastolic parameters were normal. No evidence of left ventricular regional  wall motion abnormalities.  2. The right ventricle has normal systolic function. The cavity was  normal. There is no increase in right ventricular wall thickness. Right  ventricular systolic pressure could not be assessed.  3. The aortic valve is grossly normal.   LHC 08/17/18 Left Anterior Descending  Vessel is large.  Left Circumflex  Vessel is large.  Right Coronary Artery  Vessel is large.   The left ventricular size is normal. The left ventricular systolic function is normal. LV end diastolic pressure is normal. The left ventricular ejection fraction is greater than 65% by visual estimate. No regional wall motion abnormalities. There is no evidence of mitral regurgitation.  Laboratory Data:  High Sensitivity Troponin:  No results for input(s): TROPONINIHS in the last 720 hours.    Chemistry  Recent Labs  Lab 04/09/19 2050  NA 139  K 3.5  CL 102  CO2 23  GLUCOSE 101*  BUN 17  CREATININE 0.98  CALCIUM 10.0  GFRNONAA >60  GFRAA >60  ANIONGAP 14    No results for input(s): PROT, ALBUMIN, AST, ALT, ALKPHOS, BILITOT in the last 168 hours. Hematology Recent Labs  Lab 04/09/19 2050  WBC 12.4*  RBC 5.36  HGB 16.7  HCT 48.7  MCV 90.9  MCH 31.2  MCHC 34.3  RDW 13.2  PLT 317   BNPNo results for input(s): BNP, PROBNP in the last 168 hours.  DDimer No results for input(s): DDIMER in the last 168 hours.   Radiology/Studies:  No results found. { Assessment and Plan:   Paroxysmal Atrial Fibrillation - I discussed with Travis Kaufman the nature of AF as a chronic but manageable condition. I discussed rate and rhythm control and the strong benefits for rhythm control in young patients with paroxysmal AF to prevent adverse remodeling and progression to persistent / permanent AF with atrial myopathy and heart failure in the future. He and his wife would like to learn more about AF today.  - Transitioned from diltiazem drip to PO diltiazem 30 mg q6H. As he had baseline sinus bradycardia, chronic rate control may be difficult, further supporting the potential role for rhythm control (and/or pill-in-a-pocket) for him.  - The patient's CHA2DS2-VASc score is 0, indicating a 0.2% annual risk of stroke. I had initially discussed anticoagulation with Travis Kaufman and offered short course (3 weeks + 4 weeks or TEE + 4 weeks) of AC per guidelines surrounding the time of cardioversion. Now that he is back in sinus rhythm, we can safely forego [facility] for the time being. If he develops additional risk factors (HTN, HF, DM, vascular disease), he should receive strong consideration of AC - TSH, A1c   CHA2DS2-VASc Score = 0 The patient's score is based upon: CHF History: No HTN History: No Age : < 65 Diabetes History: No Stroke History: No Vascular Disease History: No Gender: Male  Hyperlipidemia  With LDL >70 and <190, age >96, and no diabetes, he is at borderline 10-year ASCVD risk.  - recheck lipid panel with labs today - if LDL remains <190, he should  have risk-benefit discussion regarding primary prevention statin (class IIb)   Signed,  Osvaldo Shipper, MD    04/10/2019 5:55 AM     Severity of Illness: The appropriate patient status for this patient is OBSERVATION. Observation status is judged to be reasonable and necessary in order to provide the required intensity of service to ensure the patient's safety. The patient's presenting symptoms, physical exam findings, and initial radiographic and laboratory data in the context of their medical condition is felt to place them at decreased risk for further clinical deterioration. Furthermore, it is anticipated that the patient will be medically stable for discharge from the hospital within 2 midnights of admission. The following factors support the patient status of observation.   " The patient's presenting symptoms include palpitations. " The physical exam findings include tachycardia and bradycardia. " The initial radiographic and laboratory data are AF with rapid ventricular rates.   For questions or updates, please contact [place] Please consult [email or web address] for contact info under    Signed, Osvaldo Shipper, MD  04/10/2019 5:55 AM

## 2019-04-10 NOTE — Progress Notes (Signed)
Seen and examined by Dr. Launa Grill.

## 2019-04-10 NOTE — Progress Notes (Signed)
   Contacted in regards to patient's bradycardia.  Initially seen by overnight cardiology fellow 04/10/2019 for atrial fibrillation with RVR at which time he was started on diltiazem infusion which was transitioned to p.o. at 30 mg p.o. every 6 hours.  Patient spontaneously converted to sinus bradycardia with rates in the 40s.  He has been asymptomatic and is currently sleeping. Given this, his p.o. diltiazem was discontinued.  Plan was to continue following his heart rate closely.  Please see H&P for full report.   Kathyrn Drown NP-C [place] Pager: [phone]

## 2019-04-10 NOTE — Progress Notes (Signed)
Pt converted to sinus rhythm. Pt is in sinus bradycardia, in 40s-50s HR. Pt stated that his baseline HR at home is in the 40's. EKG done showing Sinus bradycardia. Dr. Launa Grill updated. Will continue to monitor pt.

## 2019-04-10 NOTE — Discharge Summary (Addendum)
Discharge Summary    Patient ID: Travis Kaufman MRN: [id number]; DOB: December 09, 1970  Admit date: 04/09/2019 Discharge date: 04/11/2019  Primary Care Provider: Luetta Nutting, DO  Primary Cardiologist: Fransico Him, MD   Discharge Diagnoses    Active Problems:   Atrial fibrillation with RVR [facility])  Diagnostic Studies/Procedures    None _____________   History of Present Illness     Travis Kaufman is a 49 y.o. male with a hx of severe GERD s/p Nissen Fundoplication in [id number] who was admitted to [facility] 04/10/19 for new diagnosis atrial fibrillation with RVR.   Hospital Course   On presentation, he reported that he has been feeling more run down for the past couple weeks. Yesterday, he felt a fluttering from his chest that radiated to his neck. He put on his smart watch and heart rate was ~200. He did not have chest pain or dyspnea associated with this. On arrival to [place] ED, HR was noted to be in the 170s with normal blood pressure. Cardioversion was offered however the patient declined. He received IV metoprolol and was started on diltiazem infususion with good rate control.   He has no known structural heart disease. He was evaluated with echo and coronary angiography last summer due to chest pain symptoms, both of which were normal. Chest pain was ultimately attributed to GERD. He drinks about 3 glasses of wine per week and avoids caffeine. He does not snore and has never been evaluated for sleep apnea. He has not been diagnosed with thyroid disorder and had never had palpitations prior to today.   On arrival to [facility], he was transitioned from diltiazem drip to oral short-acting diltiazem. Around 06:00, he self-converted to sinus bradycardia. He is not symptomatic of bradycardia.   Hospital problems include:   Paroxysmal Atrial Fibrillation -Patient was initially treated with diltiazem and converted to sinus bradycardia.  Due to low heart rates diltiazem has been  discontinued, last dose at 5 AM yesterday.  Overnight he maintained sinus bradycardia in the 30s-40s. -CHA2DS2-VASc score is 0, indicating a 0.2% annual risk of stroke. -Anticoagulation was initially discussed and a short course of AC was offered (3 weeks + 4 weeks or TEE + 4 weeks) per guidelines surrounding the time of cardioversion. Given his spontaneous conversion to NSR we can safely forego [facility] for the time being. If he develops additional risk factors (HTN, HF, DM, vascular disease), he should receive strong consideration of AC -TSH from 08/17/18=1.468 -HbA1c, 5.2 -The patient feels like he has been in and out of A. fib with fast palpitations recently.  Unfortunately with his baseline bradycardia it would be difficult to give him a beta-blocker or calcium channel blocker for rate control to prevent episodes of A. fib with RVR.  Will provide diltiazem 30 mg to use as pill in a pocket.   -Will schedule f/u in AF clinic.  If having more AF, would consider antiarrhythmic options including ablation. -The patient takes several supplements per [facility]. I advised him to review all of the supplements in the setting of new afib.   Hyperlipidemia -LDL is somewhat elevated at 161.  However with no evidence of CAD on recent cath and no diabetes, would recommend lifestyle management with diet and exercise.   -His 10-year ASCVD calculated risk is 2.2% with decreased to 1.7% with optimal risk factors.  Lifetime ASCVD risk is 46% which can be reduced to 5% with optimal risk factors. -The patient notes that he was briefly  on a statin, possibly atorvastatin in the past but this was discontinued when he had his Nissen fundoplication.  He is open to taking a statin if it reduces his future risk.  He states that he eats a very good diet with very little meat and lots of vegetables and fish. -Pt to discuss options with his PCP.   Consultants: None   The patient was seen and examined by Dr.  Gilman Schmidt who feels that he is stable and ready for discharge today   Did the patient have an acute coronary syndrome (MI, NSTEMI, STEMI, etc) this admission?:  No                               Did the patient have a percutaneous coronary intervention (stent / angioplasty)?:  No.   _____________  Discharge Vitals Blood pressure 107/76, pulse (!) 45, temperature 98 F (36.7 C), temperature source Oral, resp. rate 18, height 5\' 11"  (1.803 m), weight 74.8 kg, SpO2 99 %.  Filed Weights   04/09/19 2035 04/11/19 0431  Weight: 77.1 kg 74.8 kg   Labs & Radiologic Studies    CBC Recent Labs    04/09/19 2050 04/10/19 0713  WBC 12.4* 7.0  NEUTROABS 7.3  --   HGB 16.7 15.2  HCT 48.7 45.0  MCV 90.9 91.6  PLT 317 [id number]   Basic Metabolic Panel Recent Labs    04/09/19 2050 04/10/19 0713  NA 139 139  K 3.5 3.7  CL 102 103  CO2 23 25  GLUCOSE 101* 136*  BUN 17 13  CREATININE 0.98 0.98  CALCIUM 10.0 9.2   Disposition   Pt is being discharged home today in good condition.  Follow-up Plans & Appointments    Follow-up Information    Fenton, Clint R, PA Follow up.   Specialty: Cardiology Why: Cardiology hospital follow up in the Afib clinic on 04/25/2019 at 9:00. Please arrive 15 minutes early for check in. You will be called with instructions for parking.  Contact information: [place] [address] [phone]          Discharge Instructions    Amb referral to AFIB Clinic   Complete by: As directed    Diet - low sodium heart healthy   Complete by: As directed    Increase activity slowly   Complete by: As directed       Discharge Medications   Allergies as of 04/11/2019      Reactions   Wasp Venom Anaphylaxis, Hives   Oxycodone Other (See Comments)   "wired & can't sleep"      Medication List    TAKE these medications   acetaminophen 500 MG tablet Commonly known as: TYLENOL Take 1,000 mg by mouth 2 (two) times daily as needed for moderate pain or  headache.   dexlansoprazole 60 MG capsule Commonly known as: Dexilant Take 1 capsule (60 mg total) by mouth daily.   DHEA 25 MG Caps Take 25 mg by mouth daily.   Digestive Enzymes Tabs Take 2 tablets by mouth daily.   diltiazem 30 MG tablet Commonly known as: Cardizem Take 1 tablet (30 mg total) by mouth every 6 (six) hours as needed (If fast heart rate over 120 at rest).   EPINEPHrine 0.3 mg/0.3 mL Soaj injection Commonly known as: EPI-PEN Inject 0.3 mg into the muscle once as needed for anaphylaxis.   MAGNESIUM MALATE PO Take 450 mg by  mouth at bedtime.   Methylcobalamin 1000 MCG Lozg Take 1 mg by mouth daily.   Nature-Throid 65 MG tablet Generic drug: thyroid Take 65 mg by mouth daily before breakfast.   OVER THE COUNTER MEDICATION Take 1 tablet by mouth daily. Gastro Ease (zinc 75 mg, lactobacillus 100 mg)   PREGNENOLONE PO Take 75 mg by mouth daily.   PROBIOTIC PO Take 1 capsule by mouth daily. "Megaspore"   VITAMIN K2-VITAMIN D3 PO Take 1 tablet by mouth daily. 7500 I.U. vitamin d3 and 200 mcg K2   Zinc 50 MG Tabs Take 50 mg by mouth daily.          Outstanding Labs/Studies   None  Duration of Discharge Encounter   Greater than 30 minutes including physician time.  Signed, Daune Perch, NP 04/11/2019, 10:33 AM

## 2019-04-10 NOTE — Progress Notes (Addendum)
Pt is asleep and his HR dropped to 39.  Pt's HR is remaining in lower 40s while asleep.  Idolina Primer, RN

## 2019-04-10 NOTE — ED Notes (Signed)
Report attempted x 1.  Call back info given.

## 2019-04-11 DIAGNOSIS — R001 Bradycardia, unspecified: Secondary | ICD-10-CM

## 2019-04-11 DIAGNOSIS — I4891 Unspecified atrial fibrillation: Secondary | ICD-10-CM

## 2019-04-11 MED ORDER — DILTIAZEM HCL 30 MG PO TABS: 30.0000 mg | ORAL_TABLET | Freq: Four times a day (QID) | ORAL | 11 refills | Status: DC | PRN

## 2019-04-11 NOTE — Discharge Instructions (Signed)

## 2019-04-11 NOTE — Progress Notes (Addendum)
Progress Note  Patient Name: Travis Kaufman Date of Encounter: 04/11/2019  Primary Cardiologist: Fransico Him, MD   Subjective   The patient is feeling well this morning with no complaints.  No chest pain or shortness of breath or palpitations.  He notes that his heart rate has always been low.  Inpatient Medications    Scheduled Meds:  Continuous Infusions:  PRN Meds: acetaminophen, ondansetron (ZOFRAN) IV   Vital Signs    Vitals:   04/10/19 0600 04/10/19 1430 04/10/19 2137 04/11/19 0431  BP: 95/64 (!) 91/56 105/66 107/76  Pulse:  (!) 47 (!) 45 (!) 45  Resp: 14 12 16 18   Temp:  98.9 F (37.2 C) 98.2 F (36.8 C) 98 F (36.7 C)  TempSrc:  Oral Oral Oral  SpO2:  100% 97% 99%  Weight:    74.8 kg  Height:        Intake/Output Summary (Last 24 hours) at 04/11/2019 0824 Last data filed at 04/10/2019 1920 Gross per 24 hour  Intake 150 ml  Output 200 ml  Net -50 ml   Last 3 Weights 04/11/2019 04/09/2019 02/08/2019  Weight (lbs) 165 lb 170 lb 175 lb  Weight (kg) 74.844 kg 77.111 kg 79.379 kg      Telemetry    Sinus bradycardia in the 30s-40s overnight, occasionally up to 60s- Personally Reviewed  ECG    No new tracings for review  Physical Exam   GEN: No acute distress.   Neck: No JVD Cardiac:  Regular rhythm, bradycardic, no murmurs, rubs, or gallops.  Respiratory: Clear to auscultation bilaterally. GI: Soft, nontender, non-distended  MS: No edema; No deformity. Neuro:  Nonfocal  Psych: Normal affect   Labs    High Sensitivity Troponin:  No results for input(s): TROPONINIHS in the last 720 hours.    Chemistry Recent Labs  Lab 04/09/19 2050 04/10/19 0713  NA 139 139  K 3.5 3.7  CL 102 103  CO2 23 25  GLUCOSE 101* 136*  BUN 17 13  CREATININE 0.98 0.98  CALCIUM 10.0 9.2  PROT  --  6.2*  ALBUMIN  --  3.7  AST  --  18  ALT  --  20  ALKPHOS  --  68  BILITOT  --  0.9  GFRNONAA >60 >60  GFRAA >60 >60  ANIONGAP 14 11     Hematology Recent  Labs  Lab 04/09/19 2050 04/10/19 0713  WBC 12.4* 7.0  RBC 5.36 4.91  HGB 16.7 15.2  HCT 48.7 45.0  MCV 90.9 91.6  MCH 31.2 31.0  MCHC 34.3 33.8  RDW 13.2 13.3  PLT 317 292    BNPNo results for input(s): BNP, PROBNP in the last 168 hours.   DDimer No results for input(s): DDIMER in the last 168 hours.   Radiology    No results found.  Cardiac Studies   LEFT HEART CATH AND CORONARY ANGIOGRAPHY 08/17/2018  Conclusion   The left ventricular systolic function is normal.  LV end diastolic pressure is normal.  The left ventricular ejection fraction is greater than 65% by visual estimate.  There is no mitral valve regurgitation.   1. No angiographic evidence of CAD 2. Normal LV systolic function 3. Non-cardiac chest pain  No further ischemic workup   Echocardiogram 08/15/2018 IMPRESSIONS  1. The left ventricle has hyperdynamic systolic function, with an  ejection fraction of >65%. The cavity size was normal. Left ventricular  diastolic parameters were normal. No evidence of left ventricular regional  wall motion abnormalities.  2. The right ventricle has normal systolic function. The cavity was  normal. There is no increase in right ventricular wall thickness. Right  ventricular systolic pressure could not be assessed.  3. The aortic valve is grossly normal.    Patient Profile     49 y.o. male with severe GERD s/p Nissen Fundoplication in [id number] admitted for new diagnosis atrial fibrillation with RVR.   History of normal echo and coronary angiography in 08/2018.  Assessment & Plan    Paroxysmal atrial fibrillation -Patient was initially treated with diltiazem and converted to sinus bradycardia.  Due to low heart rates diltiazem has been discontinued, last dose at 5 AM yesterday.  Overnight he maintained sinus bradycardia in the 30s-40s. -Thyroid studies were in normal range. -CHA2DS2-VASc score is 0.  With his conversion to sinus rhythm there is no real  indication for anticoagulation at this time.  If he develops additional risk factors like hypertension, heart failure, diabetes or vascular disease, then anticoagulation should be considered. -We will have patient get up and walk in the halls.  If he is asymptomatic and rhythm remains in sinus with appropriate rate increase for activity, patient will likely be ready for discharge.  Would arrange for follow-up in our office. -The patient feels like he has been in and out of A. fib with fast palpitations recently.  Unfortunately with his baseline bradycardia it would be difficult to give him a beta-blocker or calcium channel blocker for rate control to prevent episodes of A. fib with RVR.  Could possibly provide a pill in a pocket for use as needed.  Will discuss with Dr. Nechama Guard.  Hyperlipidemia -LDL is somewhat elevated at 161.  However with no evidence of CAD on recent cath and no diabetes, would recommend lifestyle management with diet and exercise.   -His 10-year ASCVD calculated risk is 2.2% with decreased to 1.7% with optimal risk factors.  Lifetime ASCVD risk is 46% which can be reduced to 5% with optimal risk factors. -The patient notes that he was briefly on a statin, possibly atorvastatin in the past but this was discontinued when he had his Nissen fundoplication.  He is open to taking a statin if it reduces his future risk.  He states that he eats a very good diet with very little meat and lots of vegetables and fish.  For questions or updates, please contact [place] Please consult [email or web address] for contact info under        Signed, Daune Perch, NP  04/11/2019, 8:24 AM    Patient seen and examined.  Agree with above documentation.  On exam, patient is alert and oriented, regular rhythm, bradycardic, no murmurs, lungs CTAB, no LE edema or JVD.  He reports that he has had intermittent episodes where feels like heart is racing, occurs 2-3 times per year and lasts 4-6 hours.  Suspects  that he has gone into AF during those episodes.  This episode however was the first time he was diagnosed.  No indication for anticoagulation with CHADS-VASc 0.  Given resting bradycardia (telemetry reviewed and shows HR 40s, but down to 30s overnight), will not start on rate-controlling agent.  Will give prn diltiazem to use as pill in pocket.  Will schedule f/u in AF clinic.  If having more AF, would consider antiarrhythmic options including ablation.  Donato Heinz, MD

## 2019-04-11 NOTE — Progress Notes (Signed)
Discharge instructions given. Pt verbalized understanding and all questions were answered.  

## 2019-04-25 ENCOUNTER — Encounter (HOSPITAL_COMMUNITY): Payer: Self-pay | Admitting: Physician Assistant

## 2019-04-25 ENCOUNTER — Other Ambulatory Visit: Payer: Self-pay

## 2019-04-25 ENCOUNTER — Ambulatory Visit (HOSPITAL_COMMUNITY)
Admission: RE | Admit: 2019-04-25 | Discharge: 2019-04-25 | Disposition: A | Discharge status: Home / Self Care | Payer: 59 | Source: Ambulatory Visit | Attending: Physician Assistant | Admitting: Physician Assistant

## 2019-04-25 VITALS — BP 104/58 | HR 54 | Ht 71.0 in | Wt 170.4 lb

## 2019-04-25 DIAGNOSIS — I480 Paroxysmal atrial fibrillation: Secondary | ICD-10-CM | POA: Insufficient documentation

## 2019-04-25 DIAGNOSIS — Z79899 Other long term (current) drug therapy: Secondary | ICD-10-CM | POA: Insufficient documentation

## 2019-04-25 DIAGNOSIS — Z885 Allergy status to narcotic agent status: Secondary | ICD-10-CM | POA: Diagnosis not present

## 2019-04-25 DIAGNOSIS — K219 Gastro-esophageal reflux disease without esophagitis: Secondary | ICD-10-CM | POA: Insufficient documentation

## 2019-04-25 NOTE — Progress Notes (Signed)
Primary Care Physician: Luetta Nutting, DO Primary Cardiologist: Dr Radford Pax Primary Electrophysiologist: Dr Rayann Heman Referring Physician: Dr Annye English Travis Kaufman is a 49 y.o. male with a history of GERD and paroxysmal/ persistent atrial fibrillation who presents for consultation in the [place] [facility]. The patient was initially diagnosed with atrial fibrillation on 04/09/19 after presenting to the ER with symptoms of palpitations and heart racing. His watch noted a pulse rate of 170-190. DCCV was offered but patient declined. He was admitted on diltiazem drip and converted to SR. Patient is not on anticoagulation with a CHADS2VASC score of 0. Patient denies any specific triggers. He denies significant snoring or alcohol use. He has not had to use any PRN diltiazem.   Today, he denies symptoms of palpitations, chest pain, shortness of breath, orthopnea, PND, lower extremity edema, dizziness, presyncope, syncope, snoring, daytime somnolence, bleeding, or neurologic sequela. The patient is tolerating medications without difficulties and is otherwise without complaint today.    Atrial Fibrillation Risk Factors:  he does not have symptoms or diagnosis of sleep apnea. he does not have a history of rheumatic fever. he does not have a history of alcohol use. The patient does not have a history of early familial atrial fibrillation or other arrhythmias.  he has a BMI of Body mass index is 23.77 kg/m.Marland Kitchen Filed Weights   04/25/19 0908  Weight: 77.3 kg    Family History  Problem Relation Age of Onset  . GER disease Father      Atrial Fibrillation Management history:  Previous antiarrhythmic drugs: none Previous cardioversions: none Previous ablations: none CHADS2VASC score: 0 Anticoagulation history: none   Past Medical History:  Diagnosis Date  . GERD (gastroesophageal reflux disease)    Past Surgical History:  Procedure Laterality Date  . BIOPSY   08/17/2018   Procedure: BIOPSY;  Surgeon: Ronnette Juniper, MD;  Location: [facility] ENDOSCOPY;  Service: Gastroenterology;;  . ESOPHAGEAL MANOMETRY N/A 09/24/2018   Procedure: ESOPHAGEAL MANOMETRY (EM);  Surgeon: Ronnette Juniper, MD;  Location: [facility] ENDOSCOPY;  Service: Gastroenterology;  Laterality: N/A;  . ESOPHAGOGASTRODUODENOSCOPY (EGD) WITH PROPOFOL N/A 08/17/2018   Procedure: ESOPHAGOGASTRODUODENOSCOPY (EGD) WITH PROPOFOL;  Surgeon: Ronnette Juniper, MD;  Location: [place];  Service: Gastroenterology;  Laterality: N/A;  . LEFT HEART CATH AND CORONARY ANGIOGRAPHY N/A 08/17/2018   Procedure: LEFT HEART CATH AND CORONARY ANGIOGRAPHY;  Surgeon: Burnell Blanks, MD;  Location: [place] CV LAB;  Service: Cardiovascular;  Laterality: N/A;  . NISSEN FUNDOPLICATION      Current Outpatient Medications  Medication Sig Dispense Refill  . acetaminophen (TYLENOL) 500 MG tablet Take 1,000 mg by mouth 2 (two) times daily as needed for moderate pain or headache.     . diltiazem (CARDIZEM) 30 MG tablet Take 1 tablet (30 mg total) by mouth every 6 (six) hours as needed (If fast heart rate over 120 at rest). 30 tablet 11  . EPINEPHrine 0.3 mg/0.3 mL IJ SOAJ injection Inject 0.3 mg into the muscle once as needed for anaphylaxis.     . Vitamin D-Vitamin K (VITAMIN K2-VITAMIN D3 PO) Take 1 tablet by mouth daily. 7500 I.U. vitamin d3 and 200 mcg K2    . Zinc 50 MG TABS Take 50 mg by mouth daily.     No current facility-administered medications for this encounter.   Facility-Administered Medications Ordered in Other Encounters  Medication Dose Route Frequency Provider Last Rate Last Admin  . 0.9 %  sodium chloride infusion   Intravenous  Continuous Ronnette Juniper, MD        Allergies  Allergen Reactions  . Wasp Venom Anaphylaxis and Hives  . Oxycodone Other (See Comments)    "wired & can't sleep"    Social History   Socioeconomic History  . Marital status: Married    Spouse name: Not on file  . Number of  children: Not on file  . Years of education: Not on file  . Highest education level: Not on file  Occupational History  . Not on file  Tobacco Use  . Smoking status: Never Smoker  . Smokeless tobacco: Never Used  Substance and Sexual Activity  . Alcohol use: Yes    Alcohol/week: 1.0 - 2.0 standard drinks    Types: 1 - 2 Glasses of wine per week  . Drug use: Never  . Sexual activity: Not on file  Other Topics Concern  . Not on file  Social History Narrative  . Not on file   Social Determinants of Health   Financial Resource Strain:   . Difficulty of Paying Living Expenses: Not on file  Food Insecurity:   . Worried About Charity fundraiser in the Last Year: Not on file  . Ran Out of Food in the Last Year: Not on file  Transportation Needs:   . Lack of Transportation (Medical): Not on file  . Lack of Transportation (Non-Medical): Not on file  Physical Activity:   . Days of Exercise per Week: Not on file  . Minutes of Exercise per Session: Not on file  Stress:   . Feeling of Stress : Not on file  Social Connections:   . Frequency of Communication with Friends and Family: Not on file  . Frequency of Social Gatherings with Friends and Family: Not on file  . Attends Religious Services: Not on file  . Active Member of Clubs or Organizations: Not on file  . Attends Archivist Meetings: Not on file  . Marital Status: Not on file  Intimate Partner Violence:   . Fear of Current or Ex-Partner: Not on file  . Emotionally Abused: Not on file  . Physically Abused: Not on file  . Sexually Abused: Not on file     ROS- All systems are reviewed and negative except as per the HPI above.  Physical Exam: Vitals:   04/25/19 0908  BP: (!) 104/58  Pulse: (!) 54  Weight: 77.3 kg  Height: 5\' 11"  (1.803 m)    GEN- The patient is well appearing, alert and oriented x 3 today.   Head- normocephalic, atraumatic Eyes-  Sclera clear, conjunctiva pink Ears- hearing  intact Oropharynx- clear Neck- supple  Lungs- Clear to ausculation bilaterally, normal work of breathing Heart- Regular rate and rhythm, no murmurs, rubs or gallops  GI- soft, NT, ND, + BS Extremities- no clubbing, cyanosis, or edema MS- no significant deformity or atrophy Skin- no rash or lesion Psych- euthymic mood, full affect Neuro- strength and sensation are intact  Wt Readings from Last 3 Encounters:  04/25/19 77.3 kg  04/11/19 74.8 kg  02/08/19 79.4 kg    EKG today demonstrates SB HR 54, PR 134, QRS 88, QTc 388  Echo 08/15/18 demonstrated  1. The left ventricle has hyperdynamic systolic function, with an  ejection fraction of >65%. The cavity size was normal. Left ventricular  diastolic parameters were normal. No evidence of left ventricular regional  wall motion abnormalities.  2. The right ventricle has normal systolic function. The cavity was  normal. There is no increase in right ventricular wall thickness. Right  ventricular systolic pressure could not be assessed.  3. The aortic valve is grossly normal.   Epic records are reviewed at length today  CHA2DS2-VASc Score = 0 The patient's score is based upon: CHF History: No HTN History: No Age : < 65 Diabetes History: No Stroke History: No Vascular Disease History: No Gender: Male      ASSESSMENT AND PLAN: Paroxysmal Atrial Fibrillation (ICD10:  I48.0) The patient's CHA2DS2-VASc score is 0, indicating a 0.2% annual risk of stroke.   General education about afib provided and questions answered. We also discussed his stroke risk and the risks and benefits of anticoagulation. Continue diltiazem 30 mg PRN q 6hrs for heart racing. Could consider AAD or ablation if afib becomes more persistent.  Anticoagulation is not indicated at this time.   Follow up in the AF clinic in 3 months.   [facility] [address] [place], [place]  [address] [phone] 04/25/2019 10:27 AM

## 2019-05-04 ENCOUNTER — Ambulatory Visit: Payer: 59 | Admitting: Cardiology

## 2019-05-12 ENCOUNTER — Telehealth (HOSPITAL_COMMUNITY): Payer: Self-pay | Admitting: *Deleted

## 2019-05-12 NOTE — Telephone Encounter (Signed)
Patient went into AF last evening with HR in the 120-140s. They do not have a BP cuff - he has been taking cardizem 30mg  about every 6 hours. Pt doesn't feel well but denies shortness of breath, chest pain or dizziness. Instructed to use cardizem 30mg  every 4 hours for HR over 100 - get BP cuff to ensure BP is staying above [id number] systolic.Instructed to call in AM if still out of rhythm as pt is not currently on DOAC due to low chads2vas score. ER precautions reviewed as well. Wife verbalized understanding.

## 2019-07-25 ENCOUNTER — Ambulatory Visit (HOSPITAL_COMMUNITY): Payer: 59 | Admitting: Physician Assistant

## 2019-07-25 ENCOUNTER — Encounter (HOSPITAL_COMMUNITY): Payer: Self-pay

## 2019-09-20 ENCOUNTER — Other Ambulatory Visit: Payer: Self-pay

## 2019-09-20 ENCOUNTER — Ambulatory Visit (INDEPENDENT_AMBULATORY_CARE_PROVIDER_SITE_OTHER): Payer: 59 | Admitting: Family

## 2019-09-20 ENCOUNTER — Encounter: Payer: Self-pay | Admitting: Family

## 2019-09-20 ENCOUNTER — Other Ambulatory Visit: Payer: Self-pay | Admitting: Family Medicine

## 2019-09-20 VITALS — BP 118/62 | HR 67 | Temp 96.7°F | Ht 71.0 in | Wt 173.6 lb

## 2019-09-20 DIAGNOSIS — Z91030 Bee allergy status: Secondary | ICD-10-CM

## 2019-09-20 DIAGNOSIS — F418 Other specified anxiety disorders: Secondary | ICD-10-CM

## 2019-09-20 MED ORDER — ALPRAZOLAM 1 MG PO TABS: 0.5000 mg | ORAL_TABLET | Freq: Two times a day (BID) | ORAL | 0 refills | Status: DC | PRN

## 2019-09-20 MED ORDER — EPINEPHRINE 0.3 MG/0.3ML IJ SOAJ: 0.3000 mg | INTRAMUSCULAR | 5 refills | Status: DC | PRN

## 2019-09-20 NOTE — Patient Instructions (Signed)
Panic Attack  A panic attack is when you suddenly feel very afraid, uncomfortable, or nervous (anxious). A panic attack can happen when you are scared or for no reason. A panic attack can feel like a serious problem. It can even feel like a heart attack or stroke. See your doctor when you have a panic attack to make sure you do not have a serious problem. Follow these instructions at home:  Take medicines only as told by your doctor.  If you feel worried or nervous, try not to have caffeine.  Take good care of your health. To do this: ? Eat healthy. Make sure to eat fresh fruits and vegetables, whole grains, lean meats, and low-fat dairy. ? Get enough sleep. Try to sleep for 7-8 hours each night. ? Exercise. Try to be active for 30 minutes 5 or more days a week. ? Do not smoke. Talk to your doctor if you need help quitting. ? Limit how much alcohol you drink:  If you are a woman who is not pregnant: try not to have more than 1 drink a day.  If you are a man: try not to have more than 2 drinks a day.  One drink equals 12 oz of beer, 5 oz of wine, or 1 oz of hard liquor.  Keep all follow-up visits as told by your doctor. This is important. Contact a doctor if:  Your symptoms do not get better.  Your symptoms get worse.  You are not able to take your medicines as told. Get help right away if:  You have thoughts of hurting yourself or others.  You have symptoms of a panic attack. Do not drive yourself to the hospital. Have someone else drive you or call an ambulance. If you feel like you may hurt yourself or others, or have thoughts about taking your own life, get help right away. You can go to your nearest emergency department or call:  Your local emergency services (911 in the [place]  A suicide crisis helpline, such as the National Suicide Prevention Lifeline at 1-800-273-8255. This is open 24 hours a day. Summary  A panic attack is when you suddenly feel very afraid,  uncomfortable, or nervous (anxious).  See your doctor when you have a panic attack to make sure that you do not have another serious problem.  If you feel like you may hurt yourself or others, get help right away by calling 911. This information is not intended to replace advice given to you by your health care provider. Make sure you discuss any questions you have with your health care provider. Document Revised: 01/30/2017 Document Reviewed: 04/02/2016 Elsevier Patient Education  2020 Elsevier Inc.  

## 2019-09-20 NOTE — Progress Notes (Signed)
Acute Office Visit  Subjective:    Patient ID: Travis Kaufman, male    DOB: [birth year], 49 y.o.   MRN: [id number]  Chief Complaint  Patient presents with  . Medication Refill    epi pen and possibly couple of xanax due to travel anxiety//    HPI Patient is in today for a refill on her epipen. He has a history of an allergy to a wasp venom. He has 2 epipens that have expired. Has not had to use it.   Patient reports that he has had anxiety since taking anti-venom. He has had to take benzodiazepines prior to flying since this occurred. He plans to go to [place] on Thursday and needs meds to help prevent a panic attack.  Past Medical History:  Diagnosis Date  . GERD (gastroesophageal reflux disease)     Past Surgical History:  Procedure Laterality Date  . BIOPSY  08/17/2018   Procedure: BIOPSY;  Surgeon: Ronnette Juniper, MD;  Location: [facility] ENDOSCOPY;  Service: Gastroenterology;;  . ESOPHAGEAL MANOMETRY N/A 09/24/2018   Procedure: ESOPHAGEAL MANOMETRY (EM);  Surgeon: Ronnette Juniper, MD;  Location: [facility] ENDOSCOPY;  Service: Gastroenterology;  Laterality: N/A;  . ESOPHAGOGASTRODUODENOSCOPY (EGD) WITH PROPOFOL N/A 08/17/2018   Procedure: ESOPHAGOGASTRODUODENOSCOPY (EGD) WITH PROPOFOL;  Surgeon: Ronnette Juniper, MD;  Location: [place];  Service: Gastroenterology;  Laterality: N/A;  . LEFT HEART CATH AND CORONARY ANGIOGRAPHY N/A 08/17/2018   Procedure: LEFT HEART CATH AND CORONARY ANGIOGRAPHY;  Surgeon: Burnell Blanks, MD;  Location: [place] [facility];  Service: Cardiovascular;  Laterality: N/A;  . NISSEN FUNDOPLICATION      Family History  Problem Relation Age of Onset  . GER disease Father     Social History   Socioeconomic History  . Marital status: Married    Spouse name: Not on file  . Number of children: Not on file  . Years of education: Not on file  . Highest education level: Not on file  Occupational History  . Not on file  Tobacco Use  . Smoking status:  Never Smoker  . Smokeless tobacco: Never Used  Vaping Use  . Vaping Use: Never used  Substance and Sexual Activity  . Alcohol use: Yes    Alcohol/week: 1.0 - 2.0 standard drink    Types: 1 - 2 Glasses of wine per week  . Drug use: Never  . Sexual activity: Not on file  Other Topics Concern  . Not on file  Social History Narrative  . Not on file   Social Determinants of Health   Financial Resource Strain:   . Difficulty of Paying Living Expenses:   Food Insecurity:   . Worried About Charity fundraiser in the Last Year:   . Arboriculturist in the Last Year:   Transportation Needs:   . Film/video editor (Medical):   Marland Kitchen Lack of Transportation (Non-Medical):   Physical Activity:   . Days of Exercise per Week:   . Minutes of Exercise per Session:   Stress:   . Feeling of Stress :   Social Connections:   . Frequency of Communication with Friends and Family:   . Frequency of Social Gatherings with Friends and Family:   . Attends Religious Services:   . Active Member of Clubs or Organizations:   . Attends Archivist Meetings:   Marland Kitchen Marital Status:   Intimate Partner Violence:   . Fear of Current or Ex-Partner:   . Emotionally Abused:   .  Physically Abused:   . Sexually Abused:     Outpatient Medications Prior to Visit  Medication Sig Dispense Refill  . acetaminophen (TYLENOL) 500 MG tablet Take 1,000 mg by mouth 2 (two) times daily as needed for moderate pain or headache.     . diltiazem (CARDIZEM) 30 MG tablet Take 1 tablet (30 mg total) by mouth every 6 (six) hours as needed (If fast heart rate over 120 at rest). 30 tablet 11  . Vitamin D-Vitamin K (VITAMIN K2-VITAMIN D3 PO) Take 1 tablet by mouth daily. 7500 I.U. vitamin d3 and 200 mcg K2    . Zinc 50 MG TABS Take 50 mg by mouth daily.    Marland Kitchen EPINEPHrine 0.3 mg/0.3 mL IJ SOAJ injection Inject 0.3 mg into the muscle once as needed for anaphylaxis.      Facility-Administered Medications Prior to Visit    Medication Dose Route Frequency Provider Last Rate Last Admin  . 0.9 %  sodium chloride infusion   Intravenous Continuous Ronnette Juniper, MD        Allergies  Allergen Reactions  . Wasp Venom Anaphylaxis and Hives  . Oxycodone Other (See Comments)    "wired & can't sleep"    Review of Systems  Constitutional: Negative.   HENT: Negative.   Respiratory: Negative.   Cardiovascular: Negative.   Endocrine: Negative.   Genitourinary: Negative.   Musculoskeletal: Negative.   Skin: Negative.   Allergic/Immunologic: Positive for environmental allergies.  Hematological: Negative.   Psychiatric/Behavioral: The patient is nervous/anxious.        Anxiety prior to travel  All other systems reviewed and are negative.      Objective:    Physical Exam Constitutional:      Appearance: Normal appearance. He is normal weight.  Cardiovascular:     Rate and Rhythm: Normal rate and regular rhythm.  Pulmonary:     Effort: Pulmonary effort is normal.     Breath sounds: Normal breath sounds.  Abdominal:     General: Abdomen is flat.     Palpations: Abdomen is soft.  Musculoskeletal:        General: Normal range of motion.     Cervical back: Normal range of motion and neck supple.  Skin:    General: Skin is warm and dry.  Neurological:     General: No focal deficit present.     Mental Status: He is alert and oriented to person, place, and time. Mental status is at baseline.  Psychiatric:        Mood and Affect: Mood normal.        Behavior: Behavior normal.     BP 118/62   Pulse 67   Temp (!) 96.7 F (35.9 C) (Tympanic)   Ht 5\' 11"  (1.803 m)   Wt 173 lb 9.6 oz (78.7 kg)   SpO2 98%   BMI 24.21 kg/m  Wt Readings from Last 3 Encounters:  09/20/19 173 lb 9.6 oz (78.7 kg)  04/25/19 170 lb 6.4 oz (77.3 kg)  04/11/19 165 lb (74.8 kg)    Health Maintenance Due  Topic Date Due  . Hepatitis C Screening  Never done  . COVID-19 Vaccine (1) Never done  . TETANUS/TDAP  Never done     There are no preventive care reminders to display for this patient.   Lab Results  Component Value Date   TSH 0.689 04/10/2019   Lab Results  Component Value Date   WBC 7.0 04/10/2019   HGB 15.2 04/10/2019  HCT 45.0 04/10/2019   MCV 91.6 04/10/2019   PLT 292 04/10/2019   Lab Results  Component Value Date   NA 139 04/10/2019   K 3.7 04/10/2019   CO2 25 04/10/2019   GLUCOSE 136 (H) 04/10/2019   BUN 13 04/10/2019   CREATININE 0.98 04/10/2019   BILITOT 0.9 04/10/2019   ALKPHOS 68 04/10/2019   AST 18 04/10/2019   ALT 20 04/10/2019   PROT 6.2 (L) 04/10/2019   ALBUMIN 3.7 04/10/2019   CALCIUM 9.2 04/10/2019   ANIONGAP 11 04/10/2019   Lab Results  Component Value Date   CHOL 235 (H) 04/10/2019   Lab Results  Component Value Date   HDL 59 04/10/2019   Lab Results  Component Value Date   LDLCALC 161 (H) 04/10/2019   Lab Results  Component Value Date   TRIG 74 04/10/2019   Lab Results  Component Value Date   CHOLHDL 4.0 04/10/2019   Lab Results  Component Value Date   HGBA1C 5.2 04/10/2019       Assessment & Plan:   Problem List Items Addressed This Visit    None    Visit Diagnoses    Bee sting allergy    -  Primary   Situational anxiety       Relevant Medications   ALPRAZolam (XANAX) 1 MG tablet       Meds ordered this encounter  Medications  . ALPRAZolam (XANAX) 1 MG tablet    Sig: Take 0.5-1 tablets (0.5-1 mg total) by mouth 2 (two) times daily as needed for anxiety (30-60 min prior to flying).    Dispense:  10 tablet    Refill:  0  . EPINEPHrine 0.3 mg/0.3 mL IJ SOAJ injection    Sig: Inject 0.3 mLs (0.3 mg total) into the muscle as needed for anaphylaxis.    Dispense:  1 each    Refill:  5    Call the office with any questions or concern.   Kennyth Arnold, FNP

## 2019-10-25 ENCOUNTER — Ambulatory Visit (HOSPITAL_COMMUNITY): Payer: 59 | Admitting: Physician Assistant

## 2019-12-08 ENCOUNTER — Ambulatory Visit (HOSPITAL_COMMUNITY)
Admission: RE | Admit: 2019-12-08 | Discharge: 2019-12-08 | Disposition: A | Discharge status: Home / Self Care | Payer: 59 | Source: Ambulatory Visit | Attending: Physician Assistant | Admitting: Physician Assistant

## 2019-12-08 ENCOUNTER — Encounter (HOSPITAL_COMMUNITY): Payer: Self-pay | Admitting: Physician Assistant

## 2019-12-08 ENCOUNTER — Other Ambulatory Visit: Payer: Self-pay

## 2019-12-08 VITALS — BP 130/80 | HR 52 | Ht 71.0 in | Wt 176.2 lb

## 2019-12-08 DIAGNOSIS — I480 Paroxysmal atrial fibrillation: Secondary | ICD-10-CM | POA: Insufficient documentation

## 2019-12-08 DIAGNOSIS — Z79899 Other long term (current) drug therapy: Secondary | ICD-10-CM | POA: Insufficient documentation

## 2019-12-08 DIAGNOSIS — R001 Bradycardia, unspecified: Secondary | ICD-10-CM | POA: Insufficient documentation

## 2019-12-08 NOTE — Progress Notes (Addendum)
Primary Care Physician: Luetta Nutting, DO Primary Cardiologist: Dr Radford Pax Primary Electrophysiologist: Dr Rayann Heman Referring Physician: Dr Annye English Travis Kaufman is a 49 y.o. male with a history of GERD and paroxysmal atrial fibrillation who presents for follow up in the [facility]. The patient was initially diagnosed with atrial fibrillation on 04/09/19 after presenting to the ER with symptoms of palpitations and heart racing. His watch noted a pulse rate of 170-190. DCCV was offered but patient declined. He was admitted on diltiazem drip and converted to SR. Patient is not on anticoagulation with a CHADS2VASC score of 0. Diltiazem was discontinued 2/2 bradycardia with heart rates in the 30s-40s. Patient denies any specific triggers. He denies significant snoring.  On follow up today, patient reports that over the last several weeks he has had 3 episodes of afib which last anywhere from 9-17 hours. He is symptomatic with heart racing and fatigue. There were no paticular triggers he could identify. He does drink 1-2 glasses of wine with dinner.  Today, he denies symptoms of chest pain, shortness of breath, orthopnea, PND, lower extremity edema, dizziness, presyncope, syncope, snoring, daytime somnolence, bleeding, or neurologic sequela. The patient is tolerating medications without difficulties and is otherwise without complaint today.    Atrial Fibrillation Risk Factors:  he does not have symptoms or diagnosis of sleep apnea. he does not have a history of rheumatic fever. he does have a history of alcohol use. The patient does not have a history of early familial atrial fibrillation or other arrhythmias.  he has a BMI of Body mass index is 24.57 kg/m.Marland Kitchen Filed Weights   12/08/19 1126  Weight: 79.9 kg    Family History  Problem Relation Age of Onset  . GER disease Father      Atrial Fibrillation Management history:  Previous antiarrhythmic drugs:  none Previous cardioversions: none Previous ablations: none CHADS2VASC score: 0 Anticoagulation history: none   Past Medical History:  Diagnosis Date  . GERD (gastroesophageal reflux disease)    Past Surgical History:  Procedure Laterality Date  . BIOPSY  08/17/2018   Procedure: BIOPSY;  Surgeon: Ronnette Juniper, MD;  Location: [facility] ENDOSCOPY;  Service: Gastroenterology;;  . ESOPHAGEAL MANOMETRY N/A 09/24/2018   Procedure: ESOPHAGEAL MANOMETRY (EM);  Surgeon: Ronnette Juniper, MD;  Location: [facility] ENDOSCOPY;  Service: Gastroenterology;  Laterality: N/A;  . ESOPHAGOGASTRODUODENOSCOPY (EGD) WITH PROPOFOL N/A 08/17/2018   Procedure: ESOPHAGOGASTRODUODENOSCOPY (EGD) WITH PROPOFOL;  Surgeon: Ronnette Juniper, MD;  Location: [place];  Service: Gastroenterology;  Laterality: N/A;  . LEFT HEART CATH AND CORONARY ANGIOGRAPHY N/A 08/17/2018   Procedure: LEFT HEART CATH AND CORONARY ANGIOGRAPHY;  Surgeon: Burnell Blanks, MD;  Location: [place] [facility];  Service: Cardiovascular;  Laterality: N/A;  . NISSEN FUNDOPLICATION      Current Outpatient Medications  Medication Sig Dispense Refill  . acetaminophen (TYLENOL) 500 MG tablet Take 1,000 mg by mouth 2 (two) times daily as needed for moderate pain or headache.     . ALPRAZolam (XANAX) 1 MG tablet Take 0.5-1 tablets (0.5-1 mg total) by mouth 2 (two) times daily as needed for anxiety (30-60 min prior to flying). 10 tablet 0  . diltiazem (CARDIZEM) 30 MG tablet Take 1 tablet (30 mg total) by mouth every 6 (six) hours as needed (If fast heart rate over 120 at rest). 30 tablet 11  . EPINEPHrine 0.3 mg/0.3 mL IJ SOAJ injection Inject 0.3 mLs (0.3 mg total) into the muscle as needed for anaphylaxis.  1 each 5  . Nutritional Supplements (JUICE PLUS FIBRE PO) Take by mouth.     No current facility-administered medications for this encounter.   Facility-Administered Medications Ordered in Other Encounters  Medication Dose Route Frequency Provider Last Rate  Last Admin  . 0.9 %  sodium chloride infusion   Intravenous Continuous Ronnette Juniper, MD        Allergies  Allergen Reactions  . Wasp Venom Anaphylaxis and Hives  . Oxycodone Other (See Comments)    "wired & can't sleep"    Social History   Socioeconomic History  . Marital status: Married    Spouse name: Not on file  . Number of children: Not on file  . Years of education: Not on file  . Highest education level: Not on file  Occupational History  . Not on file  Tobacco Use  . Smoking status: Never Smoker  . Smokeless tobacco: Never Used  Vaping Use  . Vaping Use: Never used  Substance and Sexual Activity  . Alcohol use: Yes    Alcohol/week: 1.0 - 2.0 standard drink    Types: 1 - 2 Glasses of wine per week  . Drug use: Never  . Sexual activity: Not on file  Other Topics Concern  . Not on file  Social History Narrative  . Not on file   Social Determinants of Health   Financial Resource Strain:   . Difficulty of Paying Living Expenses: Not on file  Food Insecurity:   . Worried About Charity fundraiser in the Last Year: Not on file  . Ran Out of Food in the Last Year: Not on file  Transportation Needs:   . Lack of Transportation (Medical): Not on file  . Lack of Transportation (Non-Medical): Not on file  Physical Activity:   . Days of Exercise per Week: Not on file  . Minutes of Exercise per Session: Not on file  Stress:   . Feeling of Stress : Not on file  Social Connections:   . Frequency of Communication with Friends and Family: Not on file  . Frequency of Social Gatherings with Friends and Family: Not on file  . Attends Religious Services: Not on file  . Active Member of Clubs or Organizations: Not on file  . Attends Archivist Meetings: Not on file  . Marital Status: Not on file  Intimate Partner Violence:   . Fear of Current or Ex-Partner: Not on file  . Emotionally Abused: Not on file  . Physically Abused: Not on file  . Sexually Abused: Not  on file     ROS- All systems are reviewed and negative except as per the HPI above.  Physical Exam: Vitals:   12/08/19 1126  BP: 130/80  Pulse: (!) 52  Weight: 79.9 kg  Height: 5\' 11"  (1.803 m)    GEN- The patient is well appearing, alert and oriented x 3 today.   HEENT-head normocephalic, atraumatic, sclera clear, conjunctiva pink, hearing intact, trachea midline. Lungs- Clear to ausculation bilaterally, normal work of breathing Heart- Regular rate and rhythm, bradycardia, no murmurs, rubs or gallops  GI- soft, NT, ND, + BS Extremities- no clubbing, cyanosis, or edema MS- no significant deformity or atrophy Skin- no rash or lesion Psych- euthymic mood, full affect Neuro- strength and sensation are intact   Wt Readings from Last 3 Encounters:  12/08/19 79.9 kg  09/20/19 78.7 kg  04/25/19 77.3 kg    EKG today demonstrates SB HR 52, PR 128, QRS  88, QTc 392  Echo 08/15/18 demonstrated  1. The left ventricle has hyperdynamic systolic function, with an  ejection fraction of >65%. The cavity size was normal. Left ventricular  diastolic parameters were normal. No evidence of left ventricular regional  wall motion abnormalities.  2. The right ventricle has normal systolic function. The cavity was  normal. There is no increase in right ventricular wall thickness. Right  ventricular systolic pressure could not be assessed.  3. The aortic valve is grossly normal.   Epic records are reviewed at length today  CHA2DS2-VASc Score = 0  The patient's score is based upon: CHF History: 0 HTN History: 0 Diabetes History: 0 Stroke History: 0 Vascular Disease History: 0      ASSESSMENT AND PLAN: 1. Paroxysmal Atrial Fibrillation (ICD10:  I48.0) The patient's CHA2DS2-VASc score is 0, indicating a 0.2% annual risk of stroke.   Patient having more frequent episodes of symptomatic afib. We discussed therapeutic options today including PIP flecainide, dofetilide, and ablation. His  baseline bradycardia limits therapy. Patient would like to be considered for ablation. Continue diltiazem 30 mg PRN q 4 hours for heart racing. Patient aware he will need to be on anticoagulation short-term if ablation is pursued.  Lifestyle modification was discussed and encouraged including reducing alcohol intake.    Follow up with EP to consider ablation. He has seen Dr Rayann Heman in the past.    Adline Peals PA-C [facility] [address] [place], [place] [address] [phone] 12/08/2019 1:27 PM

## 2020-01-02 ENCOUNTER — Other Ambulatory Visit: Payer: Self-pay

## 2020-01-02 ENCOUNTER — Encounter: Payer: Self-pay | Admitting: *Deleted

## 2020-01-02 ENCOUNTER — Encounter: Payer: Self-pay | Admitting: Internal Medicine

## 2020-01-02 ENCOUNTER — Ambulatory Visit (INDEPENDENT_AMBULATORY_CARE_PROVIDER_SITE_OTHER): Payer: 59 | Admitting: Internal Medicine

## 2020-01-02 VITALS — BP 104/66 | HR 54 | Ht 71.0 in | Wt 178.4 lb

## 2020-01-02 DIAGNOSIS — I480 Paroxysmal atrial fibrillation: Secondary | ICD-10-CM | POA: Diagnosis not present

## 2020-01-02 DIAGNOSIS — I4891 Unspecified atrial fibrillation: Secondary | ICD-10-CM

## 2020-01-02 LAB — BASIC METABOLIC PANEL
BUN/Creatinine Ratio: 12 (ref 9–20)
BUN: 13 mg/dL (ref 6–24)
CO2: 25 mmol/L (ref 20–29)
Calcium: 9.7 mg/dL (ref 8.7–10.2)
Chloride: 102 mmol/L (ref 96–106)
Creatinine, Ser: 1.07 mg/dL (ref 0.76–1.27)
GFR calc Af Amer: 94 mL/min/{1.73_m2} (ref >59)
GFR calc non Af Amer: 81 mL/min/{1.73_m2} (ref >59)
Glucose: 93 mg/dL (ref 65–99)
Potassium: 4.6 mmol/L (ref 3.5–5.2)
Sodium: 139 mmol/L (ref 134–144)

## 2020-01-02 LAB — CBC WITH DIFFERENTIAL/PLATELET
Basophils Absolute: 0 10*3/uL (ref 0.0–0.2)
Basos: 1 %
EOS (ABSOLUTE): 0.2 10*3/uL (ref 0.0–0.4)
Eos: 4 %
Hematocrit: 46.9 % (ref 37.5–51.0)
Hemoglobin: 16.1 g/dL (ref 13.0–17.7)
Immature Grans (Abs): 0 10*3/uL (ref 0.0–0.1)
Immature Granulocytes: 0 %
Lymphocytes Absolute: 2.2 10*3/uL (ref 0.7–3.1)
Lymphs: 37 %
MCH: 32.5 pg (ref 26.6–33.0)
MCHC: 34.3 g/dL (ref 31.5–35.7)
MCV: 95 fL (ref 79–97)
Monocytes Absolute: 0.7 10*3/uL (ref 0.1–0.9)
Monocytes: 11 %
Neutrophils Absolute: 2.8 10*3/uL (ref 1.4–7.0)
Neutrophils: 47 %
Platelets: 304 10*3/uL (ref 150–450)
RBC: 4.95 x10E6/uL (ref 4.14–5.80)
RDW: 12.9 % (ref 11.6–15.4)
WBC: 5.9 10*3/uL (ref 3.4–10.8)

## 2020-01-02 MED ORDER — RIVAROXABAN 20 MG PO TABS: 20.0000 mg | ORAL_TABLET | Freq: Every day | ORAL | 3 refills | Status: DC

## 2020-01-02 NOTE — Patient Instructions (Addendum)
Medication Instructions:  1 Start Xarelto 20 mg  Take one tablet daily  *If you need a refill on your cardiac medications before your next appointment, please call your pharmacy*  Lab Work: CBC, BMP  If you have labs (blood work) drawn today and your tests are completely normal, you will receive your results only by: Marland Kitchen MyChart Message (if you have MyChart) OR . A paper copy in the mail If you have any lab test that is abnormal or we need to change your treatment, we will call you to review the results.  Testing/Procedures: Your physician has recommended that you have an ablation. Catheter ablation is a medical procedure used to treat some cardiac arrhythmias (irregular heartbeats). During catheter ablation, a long, thin, flexible tube is put into a blood vessel in your groin (upper thigh), or neck. This tube is called an ablation catheter. It is then guided to your heart through the blood vessel. Radio frequency waves destroy small areas of heart tissue where abnormal heartbeats may cause an arrhythmia to start. Please see the instruction sheet given to you today.   Follow-Up: At [facility], you and your health needs are our priority.  As part of our continuing mission to provide you with exceptional heart care, we have created designated Provider Care Teams.  These Care Teams include your primary Cardiologist (physician) and Advanced Practice Providers (APPs -  Physician Assistants and Nurse Practitioners) who all work together to provide you with the care you need, when you need it.  We recommend signing up for the patient portal called "MyChart".  Sign up information is provided on this After Visit Summary.  MyChart is used to connect with patients for Virtual Visits (Telemedicine).  Patients are able to view lab/test results, encounter notes, upcoming appointments, etc.  Non-urgent messages can be sent to your provider as well.   To learn more about what you can do with MyChart, go to  [email or web address].       Other Instructions:  Cardiac Ablation Cardiac ablation is a procedure to disable (ablate) a small amount of heart tissue in very specific places. The heart has many electrical connections. Sometimes these connections are abnormal and can cause the heart to beat very fast or irregularly. Ablating some of the problem areas can improve the heart rhythm or return it to normal. Ablation may be done for people who:  Have Wolff-Parkinson-White syndrome.  Have fast heart rhythms (tachycardia).  Have taken medicines for an abnormal heart rhythm (arrhythmia) that were not effective or caused side effects.  Have a high-risk heartbeat that may be life-threatening. During the procedure, a small incision is made in the neck or the groin, and a long, thin, flexible tube (catheter) is inserted into the incision and moved to the heart. Small devices (electrodes) on the tip of the catheter will send out electrical currents. A type of X-ray (fluoroscopy) will be used to help guide the catheter and to provide images of the heart. Tell a health care provider about:  Any allergies you have.  All medicines you are taking, including vitamins, herbs, eye drops, creams, and over-the-counter medicines.  Any problems you or family members have had with anesthetic medicines.  Any blood disorders you have.  Any surgeries you have had.  Any medical conditions you have, such as kidney failure.  Whether you are pregnant or may be pregnant. What are the risks? Generally, this is a safe procedure. However, problems may occur, including:  Infection.  Bruising and bleeding  at the catheter insertion site.  Bleeding into the chest, especially into the sac that surrounds the heart. This is a serious complication.  Stroke or blood clots.  Damage to other structures or organs.  Allergic reaction to medicines or dyes.  Need for a permanent pacemaker if the normal electrical system  is damaged. A pacemaker is a small computer that sends electrical signals to the heart and helps your heart beat normally.  The procedure not being fully effective. This may not be recognized until months later. Repeat ablation procedures are sometimes required. What happens before the procedure?  Follow instructions from your health care provider about eating or drinking restrictions.  Ask your health care provider about: ? Changing or stopping your regular medicines. This is especially important if you are taking diabetes medicines or blood thinners. ? Taking medicines such as aspirin and ibuprofen. These medicines can thin your blood. Do not take these medicines before your procedure if your health care provider instructs you not to.  Plan to have someone take you home from the hospital or clinic.  If you will be going home right after the procedure, plan to have someone with you for 24 hours. What happens during the procedure?  To lower your risk of infection: ? Your health care team will wash or sanitize their hands. ? Your skin will be washed with soap. ? Hair may be removed from the incision area.  An IV tube will be inserted into one of your veins.  You will be given a medicine to help you relax (sedative).  The skin on your neck or groin will be numbed.  An incision will be made in your neck or your groin.  A needle will be inserted through the incision and into a large vein in your neck or groin.  A catheter will be inserted into the needle and moved to your heart.  Dye may be injected through the catheter to help your surgeon see the area of the heart that needs treatment.  Electrical currents will be sent from the catheter to ablate heart tissue in desired areas. There are three types of energy that may be used to ablate heart tissue: ? Heat (radiofrequency energy). ? Laser energy. ? Extreme cold (cryoablation).  When the necessary tissue has been ablated, the  catheter will be removed.  Pressure will be held on the catheter insertion area to prevent excessive bleeding.  A bandage (dressing) will be placed over the catheter insertion area. The procedure may vary among health care providers and hospitals. What happens after the procedure?  Your blood pressure, heart rate, breathing rate, and blood oxygen level will be monitored until the medicines you were given have worn off.  Your catheter insertion area will be monitored for bleeding. You will need to lie still for a few hours to ensure that you do not bleed from the catheter insertion area.  Do not drive for 24 hours or as long as directed by your health care provider. Summary  Cardiac ablation is a procedure to disable (ablate) a small amount of heart tissue in very specific places. Ablating some of the problem areas can improve the heart rhythm or return it to normal.  During the procedure, electrical currents will be sent from the catheter to ablate heart tissue in desired areas. This information is not intended to replace advice given to you by your health care provider. Make sure you discuss any questions you have with your health care provider. Document  Revised: 08/10/2017 Document Reviewed: 01/07/2016 Elsevier Patient Education  [place].

## 2020-01-02 NOTE — H&P (View-Only) (Signed)
PCP: Luetta Nutting, DO Primary Cardiologist: Dr Radford Pax Primary EP: Dr Rayann Heman  Travis Kaufman is a 49 y.o. male who presents today for routine electrophysiology followup.  Since last being seen in our clinic, the patient reports doing reasonably well.  He was diagnosed with afib 04/09/19 after presenting with tachy palpitations.  Medical therapy has been limited by bradycardia.  afib events have increased in frequency and duration.   Today, he denies symptoms of chest pain, shortness of breath,  lower extremity edema, dizziness, presyncope, or syncope.  The patient is otherwise without complaint today.   Past Medical History:  Diagnosis Date  . GERD (gastroesophageal reflux disease)    Past Surgical History:  Procedure Laterality Date  . BIOPSY  08/17/2018   Procedure: BIOPSY;  Surgeon: Ronnette Juniper, MD;  Location: [facility] ENDOSCOPY;  Service: Gastroenterology;;  . ESOPHAGEAL MANOMETRY N/A 09/24/2018   Procedure: ESOPHAGEAL MANOMETRY (EM);  Surgeon: Ronnette Juniper, MD;  Location: [facility] ENDOSCOPY;  Service: Gastroenterology;  Laterality: N/A;  . ESOPHAGOGASTRODUODENOSCOPY (EGD) WITH PROPOFOL N/A 08/17/2018   Procedure: ESOPHAGOGASTRODUODENOSCOPY (EGD) WITH PROPOFOL;  Surgeon: Ronnette Juniper, MD;  Location: [place];  Service: Gastroenterology;  Laterality: N/A;  . LEFT HEART CATH AND CORONARY ANGIOGRAPHY N/A 08/17/2018   Procedure: LEFT HEART CATH AND CORONARY ANGIOGRAPHY;  Surgeon: Burnell Blanks, MD;  Location: [place] [facility];  Service: Cardiovascular;  Laterality: N/A;  . NISSEN FUNDOPLICATION      ROS- all systems are reviewed and negatives except as per HPI above  Current Outpatient Medications  Medication Sig Dispense Refill  . acetaminophen (TYLENOL) 500 MG tablet Take 1,000 mg by mouth 2 (two) times daily as needed for moderate pain or headache.     . ALPRAZolam (XANAX) 1 MG tablet Take 0.5-1 tablets (0.5-1 mg total) by mouth 2 (two) times daily as needed for anxiety (30-60 min  prior to flying). 10 tablet 0  . diltiazem (CARDIZEM) 30 MG tablet Take 1 tablet (30 mg total) by mouth every 6 (six) hours as needed (If fast heart rate over 120 at rest). 30 tablet 11  . EPINEPHrine 0.3 mg/0.3 mL IJ SOAJ injection Inject 0.3 mLs (0.3 mg total) into the muscle as needed for anaphylaxis. 1 each 5  . Nutritional Supplements (JUICE PLUS FIBRE PO) Take by mouth.     No current facility-administered medications for this visit.   Facility-Administered Medications Ordered in Other Visits  Medication Dose Route Frequency Provider Last Rate Last Admin  . 0.9 %  sodium chloride infusion   Intravenous Continuous Ronnette Juniper, MD        Physical Exam: Vitals:   01/02/20 0911  BP: 104/66  Pulse: (!) 54  SpO2: 96%  Weight: 178 lb 6.4 oz (80.9 kg)  Height: 5\' 11"  (1.803 m)    GEN- The patient is well appearing, alert and oriented x 3 today.   Head- normocephalic, atraumatic Eyes-  Sclera clear, conjunctiva pink Ears- hearing intact Oropharynx- clear Lungs- Clear to ausculation bilaterally, normal work of breathing Heart- Regular rate and rhythm, no murmurs, rubs or gallops, PMI not laterally displaced GI- soft, NT, ND, + BS Extremities- no clubbing, cyanosis, or edema  Wt Readings from Last 3 Encounters:  01/02/20 178 lb 6.4 oz (80.9 kg)  12/08/19 176 lb 3.2 oz (79.9 kg)  09/20/19 173 lb 9.6 oz (78.7 kg)   Cath 08/17/18- no CAD, normal LV function  EKG tracing ordered today is personally reviewed and shows sinus rhythm 54 bpm, PR 120 msec  without pre-excitation, QRS 96 msec, QTc 403 msec  Echo 08/15/18- EF >65%, normal echo  Assessment and Plan:  1. Atrial fibrillation The patient has symptomatic, recurrent paroxysmal atrial fibrillation. Medical therapy is limited by bradycardia Chads2vasc score is 0.  Therapeutic strategies for afib including medicine (tikosyn) and ablation were discussed in detail with the patient today. Risk, benefits, and alternatives to EP study  and radiofrequency ablation for afib were also discussed in detail today. These risks include but are not limited to stroke, bleeding, vascular damage, tamponade, perforation, damage to the esophagus, lungs, and other structures, pulmonary vein stenosis, worsening renal function, and death. The patient understands these risk and wishes to proceed.  We will therefore proceed with catheter ablation at the next available time.  Carto, ICE, anesthesia are requested for the procedure.  Will also obtain cardiac CT prior to the procedure to exclude LAA thrombus and further evaluate atrial anatomy.   Risks, benefits and potential toxicities for medications prescribed and/or refilled reviewed with patient today.   Thompson Grayer MD, [facility] 01/02/2020 9:11 AM

## 2020-01-02 NOTE — Progress Notes (Signed)
PCP: Luetta Nutting, DO Primary Cardiologist: Dr Radford Pax Primary EP: Dr Rayann Heman  Travis Kaufman is a 49 y.o. male who presents today for routine electrophysiology followup.  Since last being seen in our clinic, the patient reports doing reasonably well.  He was diagnosed with afib 04/09/19 after presenting with tachy palpitations.  Medical therapy has been limited by bradycardia.  afib events have increased in frequency and duration.   Today, he denies symptoms of chest pain, shortness of breath,  lower extremity edema, dizziness, presyncope, or syncope.  The patient is otherwise without complaint today.   Past Medical History:  Diagnosis Date  . GERD (gastroesophageal reflux disease)    Past Surgical History:  Procedure Laterality Date  . BIOPSY  08/17/2018   Procedure: BIOPSY;  Surgeon: Ronnette Juniper, MD;  Location: [facility] ENDOSCOPY;  Service: Gastroenterology;;  . ESOPHAGEAL MANOMETRY N/A 09/24/2018   Procedure: ESOPHAGEAL MANOMETRY (EM);  Surgeon: Ronnette Juniper, MD;  Location: [facility] ENDOSCOPY;  Service: Gastroenterology;  Laterality: N/A;  . ESOPHAGOGASTRODUODENOSCOPY (EGD) WITH PROPOFOL N/A 08/17/2018   Procedure: ESOPHAGOGASTRODUODENOSCOPY (EGD) WITH PROPOFOL;  Surgeon: Ronnette Juniper, MD;  Location: [place];  Service: Gastroenterology;  Laterality: N/A;  . LEFT HEART CATH AND CORONARY ANGIOGRAPHY N/A 08/17/2018   Procedure: LEFT HEART CATH AND CORONARY ANGIOGRAPHY;  Surgeon: Burnell Blanks, MD;  Location: [place] [facility];  Service: Cardiovascular;  Laterality: N/A;  . NISSEN FUNDOPLICATION      ROS- all systems are reviewed and negatives except as per HPI above  Current Outpatient Medications  Medication Sig Dispense Refill  . acetaminophen (TYLENOL) 500 MG tablet Take 1,000 mg by mouth 2 (two) times daily as needed for moderate pain or headache.     . ALPRAZolam (XANAX) 1 MG tablet Take 0.5-1 tablets (0.5-1 mg total) by mouth 2 (two) times daily as needed for anxiety (30-60 min  prior to flying). 10 tablet 0  . diltiazem (CARDIZEM) 30 MG tablet Take 1 tablet (30 mg total) by mouth every 6 (six) hours as needed (If fast heart rate over 120 at rest). 30 tablet 11  . EPINEPHrine 0.3 mg/0.3 mL IJ SOAJ injection Inject 0.3 mLs (0.3 mg total) into the muscle as needed for anaphylaxis. 1 each 5  . Nutritional Supplements (JUICE PLUS FIBRE PO) Take by mouth.     No current facility-administered medications for this visit.   Facility-Administered Medications Ordered in Other Visits  Medication Dose Route Frequency Provider Last Rate Last Admin  . 0.9 %  sodium chloride infusion   Intravenous Continuous Ronnette Juniper, MD        Physical Exam: Vitals:   01/02/20 0911  BP: 104/66  Pulse: (!) 54  SpO2: 96%  Weight: 178 lb 6.4 oz (80.9 kg)  Height: 5\' 11"  (1.803 m)    GEN- The patient is well appearing, alert and oriented x 3 today.   Head- normocephalic, atraumatic Eyes-  Sclera clear, conjunctiva pink Ears- hearing intact Oropharynx- clear Lungs- Clear to ausculation bilaterally, normal work of breathing Heart- Regular rate and rhythm, no murmurs, rubs or gallops, PMI not laterally displaced GI- soft, NT, ND, + BS Extremities- no clubbing, cyanosis, or edema  Wt Readings from Last 3 Encounters:  01/02/20 178 lb 6.4 oz (80.9 kg)  12/08/19 176 lb 3.2 oz (79.9 kg)  09/20/19 173 lb 9.6 oz (78.7 kg)   Cath 08/17/18- no CAD, normal LV function  EKG tracing ordered today is personally reviewed and shows sinus rhythm 54 bpm, PR 120 msec  without pre-excitation, QRS 96 msec, QTc 403 msec  Echo 08/15/18- EF >65%, normal echo  Assessment and Plan:  1. Atrial fibrillation The patient has symptomatic, recurrent paroxysmal atrial fibrillation. Medical therapy is limited by bradycardia Chads2vasc score is 0.  Therapeutic strategies for afib including medicine (tikosyn) and ablation were discussed in detail with the patient today. Risk, benefits, and alternatives to EP study  and radiofrequency ablation for afib were also discussed in detail today. These risks include but are not limited to stroke, bleeding, vascular damage, tamponade, perforation, damage to the esophagus, lungs, and other structures, pulmonary vein stenosis, worsening renal function, and death. The patient understands these risk and wishes to proceed.  We will therefore proceed with catheter ablation at the next available time.  Carto, ICE, anesthesia are requested for the procedure.  Will also obtain cardiac CT prior to the procedure to exclude LAA thrombus and further evaluate atrial anatomy.   Risks, benefits and potential toxicities for medications prescribed and/or refilled reviewed with patient today.   Thompson Grayer MD, [facility] 01/02/2020 9:11 AM

## 2020-01-20 ENCOUNTER — Telehealth (HOSPITAL_COMMUNITY): Payer: Self-pay | Admitting: Emergency Medicine

## 2020-01-20 NOTE — Telephone Encounter (Signed)
Attempted to call patient regarding upcoming cardiac CT appointment. °Left message on voicemail with name and callback number °Vanesha Athens RN Navigator Cardiac Imaging °[facility] °[phone] Office °[phone] Cell ° °

## 2020-01-23 ENCOUNTER — Telehealth (HOSPITAL_COMMUNITY): Payer: Self-pay | Admitting: Emergency Medicine

## 2020-01-23 NOTE — Telephone Encounter (Signed)
Pt returning phone call regarding upcoming cardiac imaging study; pt verbalizes understanding of appt date/time, parking situation and where to check in, pre-test NPO status and medications ordered, and verified current allergies; name and call back number provided for further questions should they arise Azael Ragain RN Navigator Cardiac Imaging [facility] [phone] office [phone] cell   

## 2020-01-24 ENCOUNTER — Other Ambulatory Visit: Payer: Self-pay

## 2020-01-24 ENCOUNTER — Ambulatory Visit (HOSPITAL_COMMUNITY)
Admission: RE | Admit: 2020-01-24 | Discharge: 2020-01-24 | Disposition: A | Discharge status: Home / Self Care | Payer: 59 | Source: Ambulatory Visit | Attending: Internal Medicine | Admitting: Internal Medicine

## 2020-01-24 DIAGNOSIS — I4891 Unspecified atrial fibrillation: Secondary | ICD-10-CM

## 2020-01-24 MED ORDER — IOHEXOL 350 MG/ML SOLN
80.0000 mL | Freq: Once | INTRAVENOUS | Status: AC | PRN
  Administered 2020-01-24: 80 mL via INTRAVENOUS

## 2020-01-30 ENCOUNTER — Other Ambulatory Visit (HOSPITAL_COMMUNITY)
Admission: RE | Admit: 2020-01-30 | Discharge: 2020-01-30 | Disposition: A | Discharge status: Home / Self Care | Payer: 59 | Source: Ambulatory Visit | Attending: Internal Medicine | Admitting: Internal Medicine

## 2020-01-30 DIAGNOSIS — Z20822 Contact with and (suspected) exposure to covid-19: Secondary | ICD-10-CM | POA: Diagnosis not present

## 2020-01-30 DIAGNOSIS — Z01812 Encounter for preprocedural laboratory examination: Secondary | ICD-10-CM | POA: Insufficient documentation

## 2020-01-30 LAB — SARS CORONAVIRUS 2 (TAT 6-24 HRS): SARS Coronavirus 2: NEGATIVE

## 2020-01-30 NOTE — Progress Notes (Signed)
Instructed patient on the following items: Arrival time 0830 Nothing to eat or drink after midnight No meds AM of procedure Responsible person to drive you home and stay with you for 24 hrs  Have you missed any doses of anti-coagulant Xarelto-hasn't missed any doses   

## 2020-01-31 ENCOUNTER — Ambulatory Visit (HOSPITAL_COMMUNITY): Payer: 59 | Admitting: Registered Nurse

## 2020-01-31 ENCOUNTER — Encounter (HOSPITAL_COMMUNITY)
Admission: RE | Disposition: A | Discharge status: Home / Self Care | Payer: Self-pay | Source: Home / Self Care | Attending: Internal Medicine

## 2020-01-31 ENCOUNTER — Encounter (HOSPITAL_COMMUNITY): Payer: Self-pay | Admitting: Internal Medicine

## 2020-01-31 ENCOUNTER — Ambulatory Visit (HOSPITAL_COMMUNITY)
Admission: RE | Admit: 2020-01-31 | Discharge: 2020-01-31 | Disposition: A | Discharge status: Home / Self Care | Payer: 59 | Attending: Internal Medicine | Admitting: Internal Medicine

## 2020-01-31 DIAGNOSIS — R001 Bradycardia, unspecified: Secondary | ICD-10-CM | POA: Diagnosis not present

## 2020-01-31 DIAGNOSIS — Z79899 Other long term (current) drug therapy: Secondary | ICD-10-CM | POA: Insufficient documentation

## 2020-01-31 DIAGNOSIS — I480 Paroxysmal atrial fibrillation: Secondary | ICD-10-CM | POA: Insufficient documentation

## 2020-01-31 HISTORY — PX: ATRIAL FIBRILLATION ABLATION: EP1191

## 2020-01-31 SURGERY — ATRIAL FIBRILLATION ABLATION
Anesthesia: General

## 2020-01-31 MED ORDER — ISOPROTERENOL HCL 0.2 MG/ML IJ SOLN
INTRAVENOUS | Status: DC | PRN
  Administered 2020-01-31: 20 ug/min via INTRAVENOUS

## 2020-01-31 MED ORDER — SUGAMMADEX SODIUM 200 MG/2ML IV SOLN
INTRAVENOUS | Status: DC | PRN
  Administered 2020-01-31: 200 mg via INTRAVENOUS

## 2020-01-31 MED ORDER — HEPARIN SODIUM (PORCINE) 1000 UNIT/ML IJ SOLN
INTRAMUSCULAR | Status: DC | PRN
  Administered 2020-01-31: 14000 [IU] via INTRAVENOUS
  Administered 2020-01-31: 1000 [IU] via INTRAVENOUS

## 2020-01-31 MED ORDER — PROPOFOL 10 MG/ML IV BOLUS
INTRAVENOUS | Status: DC | PRN
  Administered 2020-01-31 (×2): 50 mg via INTRAVENOUS
  Administered 2020-01-31: 200 mg via INTRAVENOUS

## 2020-01-31 MED ORDER — ONDANSETRON HCL 4 MG/2ML IJ SOLN
INTRAMUSCULAR | Status: AC
  Filled 2020-01-31: qty 2

## 2020-01-31 MED ORDER — PROTAMINE SULFATE 10 MG/ML IV SOLN
INTRAVENOUS | Status: DC | PRN
  Administered 2020-01-31: 40 mg via INTRAVENOUS

## 2020-01-31 MED ORDER — MIDAZOLAM HCL 5 MG/5ML IJ SOLN
INTRAMUSCULAR | Status: DC | PRN
  Administered 2020-01-31: 2 mg via INTRAVENOUS

## 2020-01-31 MED ORDER — EPHEDRINE SULFATE-NACL 50-0.9 MG/10ML-% IV SOSY
PREFILLED_SYRINGE | INTRAVENOUS | Status: DC | PRN
  Administered 2020-01-31 (×2): 10 mg via INTRAVENOUS

## 2020-01-31 MED ORDER — SODIUM CHLORIDE 0.9% FLUSH: 3.0000 mL | INTRAVENOUS | Status: DC | PRN

## 2020-01-31 MED ORDER — ONDANSETRON HCL 4 MG/2ML IJ SOLN
4.0000 mg | Freq: Four times a day (QID) | INTRAMUSCULAR | Status: DC | PRN
  Administered 2020-01-31: 4 mg via INTRAVENOUS

## 2020-01-31 MED ORDER — HEPARIN (PORCINE) IN NACL 1000-0.9 UT/500ML-% IV SOLN
INTRAVENOUS | Status: AC
  Filled 2020-01-31: qty 500

## 2020-01-31 MED ORDER — FENTANYL CITRATE (PF) 100 MCG/2ML IJ SOLN
INTRAMUSCULAR | Status: DC | PRN
  Administered 2020-01-31: 50 ug via INTRAVENOUS

## 2020-01-31 MED ORDER — HEPARIN SODIUM (PORCINE) 1000 UNIT/ML IJ SOLN
INTRAMUSCULAR | Status: AC
  Filled 2020-01-31: qty 2

## 2020-01-31 MED ORDER — ISOPROTERENOL HCL 0.2 MG/ML IJ SOLN
INTRAMUSCULAR | Status: AC
  Filled 2020-01-31: qty 5

## 2020-01-31 MED ORDER — DEXAMETHASONE SODIUM PHOSPHATE 4 MG/ML IJ SOLN
INTRAMUSCULAR | Status: DC | PRN
  Administered 2020-01-31: 10 mg via INTRAVENOUS

## 2020-01-31 MED ORDER — SODIUM CHLORIDE 0.9 % IV SOLN: 250.0000 mL | INTRAVENOUS | Status: DC | PRN

## 2020-01-31 MED ORDER — HEPARIN (PORCINE) IN NACL 1000-0.9 UT/500ML-% IV SOLN
INTRAVENOUS | Status: DC | PRN
  Administered 2020-01-31: 500 mL

## 2020-01-31 MED ORDER — HEPARIN SODIUM (PORCINE) 1000 UNIT/ML IJ SOLN
INTRAMUSCULAR | Status: DC | PRN
  Administered 2020-01-31: 3000 [IU] via INTRAVENOUS

## 2020-01-31 MED ORDER — SODIUM CHLORIDE 0.9 % IV SOLN: INTRAVENOUS | Status: DC

## 2020-01-31 MED ORDER — ONDANSETRON HCL 4 MG/2ML IJ SOLN
INTRAMUSCULAR | Status: DC | PRN
  Administered 2020-01-31: 4 mg via INTRAVENOUS

## 2020-01-31 MED ORDER — SODIUM CHLORIDE 0.9% FLUSH: 3.0000 mL | Freq: Two times a day (BID) | INTRAVENOUS | Status: DC

## 2020-01-31 MED ORDER — ACETAMINOPHEN 325 MG PO TABS
650.0000 mg | ORAL_TABLET | ORAL | Status: DC | PRN
  Filled 2020-01-31: qty 2

## 2020-01-31 MED ORDER — LIDOCAINE 2% (20 MG/ML) 5 ML SYRINGE
INTRAMUSCULAR | Status: DC | PRN
  Administered 2020-01-31: 60 mg via INTRAVENOUS
  Administered 2020-01-31: 40 mg via INTRAVENOUS

## 2020-01-31 MED ORDER — ROCURONIUM BROMIDE 10 MG/ML (PF) SYRINGE
PREFILLED_SYRINGE | INTRAVENOUS | Status: DC | PRN
  Administered 2020-01-31: 60 mg via INTRAVENOUS

## 2020-01-31 MED ORDER — PANTOPRAZOLE SODIUM 40 MG PO TBEC: 40.0000 mg | DELAYED_RELEASE_TABLET | Freq: Every day | ORAL | 0 refills | Status: DC

## 2020-01-31 SURGICAL SUPPLY — 20 items
BLANKET WARM UNDERBOD FULL ACC (MISCELLANEOUS) ×3 IMPLANT
CATH 8FR REPROCESSED SOUNDSTAR (CATHETERS) ×3 IMPLANT
CATH 8FR SOUNDSTAR REPROCESSED (CATHETERS) IMPLANT
CATH MAPPNG PENTARAY F 2-6-2MM (CATHETERS) IMPLANT
CATH SMTCH THERMOCOOL SF DF (CATHETERS) ×2 IMPLANT
CATH WEB BI DIR CSDF CRV REPRO (CATHETERS) ×2 IMPLANT
CLOSURE PERCLOSE PROSTYLE (VASCULAR PRODUCTS) ×6 IMPLANT
COVER SWIFTLINK CONNECTOR (BAG) ×3 IMPLANT
NDL BAYLIS TRANSSEPTAL 71CM (NEEDLE) IMPLANT
NEEDLE BAYLIS TRANSSEPTAL 71CM (NEEDLE) ×3 IMPLANT
PACK EP LATEX FREE (CUSTOM PROCEDURE TRAY) ×3
PACK EP LF (CUSTOM PROCEDURE TRAY) ×1 IMPLANT
PAD PRO RADIOLUCENT 2001M-C (PAD) ×3 IMPLANT
PATCH CARTO3 (PAD) ×2 IMPLANT
PENTARAY F 2-6-2MM (CATHETERS) ×3
SHEATH PINNACLE 7F 10CM (SHEATH) ×4 IMPLANT
SHEATH PINNACLE 9F 10CM (SHEATH) ×2 IMPLANT
SHEATH PROBE COVER 6X72 (BAG) ×2 IMPLANT
SHEATH SWARTZ TS SL2 63CM 8.5F (SHEATH) ×2 IMPLANT
TUBING SMART ABLATE COOLFLOW (TUBING) ×2 IMPLANT

## 2020-01-31 NOTE — Anesthesia Preprocedure Evaluation (Addendum)
Anesthesia Evaluation  Patient identified by MRN, date of birth, ID band Patient awake    Reviewed: Allergy & Precautions, NPO status , Patient's Chart, lab work & pertinent test results  History of Anesthesia Complications Negative for: history of anesthetic complications  Airway Mallampati: II  TM Distance: >3 FB Neck ROM: Full    Dental  (+) Dental Advisory Given, Teeth Intact   Pulmonary neg pulmonary ROS,  01/30/2020 SARS coronavirus NEG   breath sounds clear to auscultation       Cardiovascular hypertension, Pt. on medications (-) angina(-) CAD (normal coronaries by cath)  Rhythm:Irregular Rate:Normal  '20 ECHO: EF >65%, no significant valvular abnormalities   Neuro/Psych negative neurological ROS     GI/Hepatic Neg liver ROS, GERD  Medicated and Controlled,  Endo/Other  negative endocrine ROS  Renal/GU negative Renal ROS     Musculoskeletal   Abdominal   Peds  Hematology xarelto   Anesthesia Other Findings   Reproductive/Obstetrics                            Anesthesia Physical Anesthesia Plan  ASA: III  Anesthesia Plan: General   Post-op Pain Management:    Induction: Intravenous  PONV Risk Score and Plan: 2 and Ondansetron and Dexamethasone  Airway Management Planned: Oral ETT  Additional Equipment: None  Intra-op Plan:   Post-operative Plan: Extubation in OR  Informed Consent: I have reviewed the patients History and Physical, chart, labs and discussed the procedure including the risks, benefits and alternatives for the proposed anesthesia with the patient or authorized representative who has indicated his/her understanding and acceptance.     Dental advisory given  Plan Discussed with: CRNA and Surgeon  Anesthesia Plan Comments:        Anesthesia Quick Evaluation

## 2020-01-31 NOTE — Progress Notes (Signed)
Rt groin dressing mostly saturated. Removed, manual pressure held x 15 minutes. Tender, level 0. No further oozing. Dressed w/gauze and tegaderm

## 2020-01-31 NOTE — Anesthesia Postprocedure Evaluation (Signed)
Anesthesia Post Note  Patient: Travis Kaufman  Procedure(s) Performed: ATRIAL FIBRILLATION ABLATION (N/A )     Patient location during evaluation: Phase II Anesthesia Type: General Level of consciousness: awake and alert, patient cooperative and oriented Pain management: pain level controlled Vital Signs Assessment: post-procedure vital signs reviewed and stable Respiratory status: spontaneous breathing, nonlabored ventilation and respiratory function stable Cardiovascular status: blood pressure returned to baseline and stable Postop Assessment: no apparent nausea or vomiting and adequate PO intake (beginning to eat, nausea resolved) Anesthetic complications: no   No complications documented.  Last Vitals:  Vitals:   01/31/20 1320 01/31/20 1339  BP: 119/68 122/69  Pulse: 71 63  Resp: 16 (!) 8  Temp:    SpO2: 100% 96%    Last Pain:  Vitals:   01/31/20 1343  TempSrc:   PainSc: 0-No pain                 [place] Chelf,E. Westyn Driggers

## 2020-01-31 NOTE — Anesthesia Procedure Notes (Signed)
Procedure Name: Intubation Date/Time: 01/31/2020 10:42 AM Performed by: Lieutenant Diego, CRNA Pre-anesthesia Checklist: Patient identified, Emergency Drugs available, Suction available and Patient being monitored Patient Re-evaluated:Patient Re-evaluated prior to induction Oxygen Delivery Method: Circle system utilized Preoxygenation: Pre-oxygenation with 100% oxygen Induction Type: IV induction Ventilation: Mask ventilation without difficulty Laryngoscope Size: Miller and 2 Grade View: Grade I Tube type: Oral Tube size: 7.5 mm Number of attempts: 1 Airway Equipment and Method: Stylet Placement Confirmation: ETT inserted through vocal cords under direct vision,  positive ETCO2 and breath sounds checked- equal and bilateral Secured at: 23 cm Tube secured with: Tape Dental Injury: Teeth and Oropharynx as per pre-operative assessment

## 2020-01-31 NOTE — Discharge Instructions (Signed)
PICK UP NEW MEDICATION  Post procedure care instructions No driving for 4 days. No lifting over 5 lbs for 1 week. No vigorous or sexual activity for 1 week. You may return to work/your usual activities on 02/07/2020. Keep procedure site clean & dry. If you notice increased pain, swelling, bleeding or pus, call/return!  You may shower after 24 hours, but no soaking in baths/hot tubs/pools for 1 week.      Cardiac Ablation, Care After  This sheet gives you information about how to care for yourself after your procedure. Your health care provider may also give you more specific instructions. If you have problems or questions, contact your health care provider. What can I expect after the procedure? After the procedure, it is common to have:  Bruising around your puncture site.  Tenderness around your puncture site.  Skipped heartbeats.  Tiredness (fatigue).  Follow these instructions at home: Puncture site care   Follow instructions from your health care provider about how to take care of your puncture site. Make sure you: ? If present, leave stitches (sutures), skin glue, or adhesive strips in place. These skin closures may need to stay in place for up to 2 weeks. If adhesive strip edges start to loosen and curl up, you may trim the loose edges. Do not remove adhesive strips completely unless your health care provider tells you to do that. ? If a large square bandage is present, this may be removed 24 hours after surgery.   Check your puncture site every day for signs of infection. Check for: ? Redness, swelling, or pain. ? Fluid or blood. If your puncture site starts to bleed, lie down on your back, apply firm pressure to the area, and contact your health care provider. ? Warmth. ? Pus or a bad smell. Driving  Do not drive for at least 4 days after your procedure or however long your health care provider recommends. (Do not resume driving if you have previously been instructed not  to drive for other health reasons.)  Do not drive or use heavy machinery while taking prescription pain medicine. Activity  Avoid activities that take a lot of effort for at least 7 days after your procedure.  Do not lift anything that is heavier than 5 lb (4.5 kg) for one week.   No sexual activity for 1 week.   Return to your normal activities as told by your health care provider. Ask your health care provider what activities are safe for you. General instructions  Take over-the-counter and prescription medicines only as told by your health care provider.  Do not use any products that contain nicotine or tobacco, such as cigarettes and e-cigarettes. If you need help quitting, ask your health care provider.  You may shower after 24 hours, but Do not take baths, swim, or use a hot tub for 1 week.   Do not drink alcohol for 24 hours after your procedure.  Keep all follow-up visits as told by your health care provider. This is important. Contact a health care provider if:  You have redness, mild swelling, or pain around your puncture site.  You have fluid or blood coming from your puncture site that stops after applying firm pressure to the area.  Your puncture site feels warm to the touch.  You have pus or a bad smell coming from your puncture site.  You have a fever.  You have chest pain or discomfort that spreads to your neck, jaw, or arm.  You are sweating a lot.  You feel nauseous.  You have a fast or irregular heartbeat.  You have shortness of breath.  You are dizzy or light-headed and feel the need to lie down.  You have pain or numbness in the arm or leg closest to your puncture site. Get help right away if:  Your puncture site suddenly swells.  Your puncture site is bleeding and the bleeding does not stop after applying firm pressure to the area. These symptoms may represent a serious problem that is an emergency. Do not wait to see if the symptoms will go  away. Get medical help right away. Call your local emergency services (911 in the [place] Do not drive yourself to the hospital. Summary  After the procedure, it is normal to have bruising and tenderness at the puncture site in your groin, neck, or forearm.  Check your puncture site every day for signs of infection.  Get help right away if your puncture site is bleeding and the bleeding does not stop after applying firm pressure to the area. This is a medical emergency. This information is not intended to replace advice given to you by your health care provider. Make sure you discuss any questions you have with your health care provider.    You have an appointment set up with the [facility].  Multiple studies have shown that being followed by a dedicated atrial fibrillation clinic in addition to the standard care you receive from your other physicians improves health. We believe that enrollment in the atrial fibrillation clinic will allow [place] to better care for you.   The phone number to the [facility] is [phone]. The clinic is staffed Monday through Friday from 8:30am to 5pm.  Parking Directions: The clinic is located in the Heart and Vascular Building connected to [facility]. 1)From [address] turn on to [address] and go to the 3rd entrance  (Heart and Vascular entrance) on the right. 2)Look to the right for Heart &Vascular Parking Garage. 3)A code for the entrance is required, for December is 3007.   4)Take the elevators to the 1st floor. Registration is in the room with the glass walls at the end of the hallway.  If you have any trouble parking or locating the clinic, please don't hesitate to call [phone].

## 2020-01-31 NOTE — Interval H&P Note (Signed)
History and Physical Interval Note:  01/31/2020 10:17 AM  Travis Kaufman  has presented today for surgery, with the diagnosis of afib.  The various methods of treatment have been discussed with the patient and family. After consideration of risks, benefits and other options for treatment, the patient has consented to  Procedure(s): ATRIAL FIBRILLATION ABLATION (N/A) as a surgical intervention.  The patient's history has been reviewed, patient examined, no change in status, stable for surgery.  I have reviewed the patient's chart and labs.  Questions were answered to the patient's satisfaction.    Cardiac CT was reviewed with him at length today.  He reports compliance with xarelto without interruption.  Risk, benefits, and alternatives to EP study and radiofrequency ablation for afib were also discussed in detail today. These risks include but are not limited to stroke, bleeding, vascular damage, tamponade, perforation, damage to the esophagus, lungs, and other structures, pulmonary vein stenosis, worsening renal function, and death. The patient understands these risk and wishes to proceed.     Thompson Grayer

## 2020-01-31 NOTE — Transfer of Care (Signed)
Immediate Anesthesia Transfer of Care Note  Patient: Travis Kaufman  Procedure(s) Performed: ATRIAL FIBRILLATION ABLATION (N/A )  Patient Location: Cath Lab  Anesthesia Type:General  Level of Consciousness: awake and alert   Airway & Oxygen Therapy: Patient Spontanous Breathing and Patient connected to face mask oxygen  Post-op Assessment: Report given to RN and Post -op Vital signs reviewed and stable  Post vital signs: Reviewed and stable  Last Vitals:  Vitals Value Taken Time  BP    Temp    Pulse 81 01/31/20 1241  Resp 12 01/31/20 1241  SpO2 98 % 01/31/20 1241  Vitals shown include unvalidated device data.  Last Pain:  Vitals:   01/31/20 0924  TempSrc:   PainSc: 0-No pain      Patients Stated Pain Goal: 3 (36/01/65 8006)  Complications: No complications documented.

## 2020-02-01 ENCOUNTER — Other Ambulatory Visit: Payer: Self-pay

## 2020-02-01 ENCOUNTER — Observation Stay (HOSPITAL_BASED_OUTPATIENT_CLINIC_OR_DEPARTMENT_OTHER)
Admission: EM | Admit: 2020-02-01 | Discharge: 2020-02-02 | Disposition: A | Discharge status: Home / Self Care | Payer: 59 | Attending: Cardiology | Admitting: Cardiology

## 2020-02-01 ENCOUNTER — Encounter (HOSPITAL_COMMUNITY): Payer: Self-pay | Admitting: Internal Medicine

## 2020-02-01 ENCOUNTER — Telehealth: Payer: Self-pay | Admitting: Medical

## 2020-02-01 DIAGNOSIS — R0789 Other chest pain: Secondary | ICD-10-CM | POA: Insufficient documentation

## 2020-02-01 DIAGNOSIS — R000 Tachycardia, unspecified: Secondary | ICD-10-CM | POA: Diagnosis not present

## 2020-02-01 DIAGNOSIS — R002 Palpitations: Secondary | ICD-10-CM | POA: Diagnosis present

## 2020-02-01 DIAGNOSIS — I4891 Unspecified atrial fibrillation: Principal | ICD-10-CM | POA: Diagnosis present

## 2020-02-01 DIAGNOSIS — Z955 Presence of coronary angioplasty implant and graft: Secondary | ICD-10-CM | POA: Diagnosis not present

## 2020-02-01 DIAGNOSIS — R778 Other specified abnormalities of plasma proteins: Secondary | ICD-10-CM

## 2020-02-01 DIAGNOSIS — Z7901 Long term (current) use of anticoagulants: Secondary | ICD-10-CM | POA: Diagnosis not present

## 2020-02-01 DIAGNOSIS — Z20822 Contact with and (suspected) exposure to covid-19: Secondary | ICD-10-CM | POA: Diagnosis not present

## 2020-02-01 DIAGNOSIS — R7989 Other specified abnormal findings of blood chemistry: Secondary | ICD-10-CM

## 2020-02-01 LAB — COMPREHENSIVE METABOLIC PANEL
ALT: 24 U/L (ref 0–44)
AST: 29 U/L (ref 15–41)
Albumin: 4.4 g/dL (ref 3.5–5.0)
Alkaline Phosphatase: 71 U/L (ref 38–126)
Anion gap: 16 — ABNORMAL HIGH (ref 5–15)
BUN: 8 mg/dL (ref 6–20)
CO2: 18 mmol/L — ABNORMAL LOW (ref 22–32)
Calcium: 9.1 mg/dL (ref 8.9–10.3)
Chloride: 108 mmol/L (ref 98–111)
Creatinine, Ser: 0.9 mg/dL (ref 0.61–1.24)
GFR, Estimated: >60 mL/min (ref >60)
Glucose, Bld: 113 mg/dL — ABNORMAL HIGH (ref 70–99)
Potassium: 3.5 mmol/L (ref 3.5–5.1)
Sodium: 142 mmol/L (ref 135–145)
Total Bilirubin: 0.1 mg/dL — ABNORMAL LOW (ref 0.3–1.2)
Total Protein: 7.5 g/dL (ref 6.5–8.1)

## 2020-02-01 LAB — TROPONIN I (HIGH SENSITIVITY)
Troponin I (High Sensitivity): 1301 ng/L (ref <18)
Troponin I (High Sensitivity): 1046 ng/L (ref <18)

## 2020-02-01 LAB — CBC WITH DIFFERENTIAL/PLATELET
Abs Immature Granulocytes: 0.04 10*3/uL (ref 0.00–0.07)
Basophils Absolute: 0.1 10*3/uL (ref 0.0–0.1)
Basophils Relative: 0 %
Eosinophils Absolute: 0.2 10*3/uL (ref 0.0–0.5)
Eosinophils Relative: 2 %
HCT: 46.3 % (ref 39.0–52.0)
Hemoglobin: 16.1 g/dL (ref 13.0–17.0)
Immature Granulocytes: 0 %
Lymphocytes Relative: 32 %
Lymphs Abs: 3.8 10*3/uL (ref 0.7–4.0)
MCH: 32.7 pg (ref 26.0–34.0)
MCHC: 34.8 g/dL (ref 30.0–36.0)
MCV: 94.1 fL (ref 80.0–100.0)
Monocytes Absolute: 1.1 10*3/uL — ABNORMAL HIGH (ref 0.1–1.0)
Monocytes Relative: 9 %
Neutro Abs: 6.9 10*3/uL (ref 1.7–7.7)
Neutrophils Relative %: 57 %
Platelets: 317 10*3/uL (ref 150–400)
RBC: 4.92 MIL/uL (ref 4.22–5.81)
RDW: 13.8 % (ref 11.5–15.5)
WBC: 12.1 10*3/uL — ABNORMAL HIGH (ref 4.0–10.5)
nRBC: 0 % (ref 0.0–0.2)

## 2020-02-01 LAB — POCT ACTIVATED CLOTTING TIME: Activated Clotting Time: 312 seconds

## 2020-02-01 LAB — RESP PANEL BY RT-PCR (FLU A&B, COVID) ARPGX2
Influenza A by PCR: NEGATIVE
Influenza B by PCR: NEGATIVE
SARS Coronavirus 2 by RT PCR: NEGATIVE

## 2020-02-01 MED ORDER — METOPROLOL TARTRATE 12.5 MG HALF TABLET
12.5000 mg | ORAL_TABLET | Freq: Four times a day (QID) | ORAL | Status: DC
  Administered 2020-02-02: 12.5 mg via ORAL
  Filled 2020-02-01: qty 1

## 2020-02-01 MED ORDER — METOPROLOL TARTRATE 25 MG PO TABS
25.0000 mg | ORAL_TABLET | Freq: Four times a day (QID) | ORAL | Status: DC
Start: 2020-02-02 — End: 2020-02-01

## 2020-02-01 MED ORDER — ACETAMINOPHEN 325 MG PO TABS: 650.0000 mg | ORAL_TABLET | ORAL | Status: DC | PRN

## 2020-02-01 MED ORDER — RIVAROXABAN 20 MG PO TABS
ORAL_TABLET | ORAL | Status: AC
  Filled 2020-02-01: qty 1

## 2020-02-01 MED ORDER — RIVAROXABAN 20 MG PO TABS
20.0000 mg | ORAL_TABLET | Freq: Once | ORAL | Status: AC
  Administered 2020-02-01: 20 mg via ORAL

## 2020-02-01 MED ORDER — PANTOPRAZOLE SODIUM 40 MG PO TBEC
40.0000 mg | DELAYED_RELEASE_TABLET | Freq: Every day | ORAL | Status: DC
  Administered 2020-02-02: 40 mg via ORAL
  Filled 2020-02-01: qty 1

## 2020-02-01 MED ORDER — SODIUM CHLORIDE 0.9 % IV BOLUS
1000.0000 mL | Freq: Once | INTRAVENOUS | Status: AC
  Administered 2020-02-01: 1000 mL via INTRAVENOUS

## 2020-02-01 MED ORDER — DILTIAZEM HCL 25 MG/5ML IV SOLN
10.0000 mg | Freq: Once | INTRAVENOUS | Status: AC
  Administered 2020-02-01: 10 mg via INTRAVENOUS
  Filled 2020-02-01: qty 5

## 2020-02-01 MED ORDER — RIVAROXABAN 20 MG PO TABS: 20.0000 mg | ORAL_TABLET | Freq: Every day | ORAL | Status: DC

## 2020-02-01 MED ORDER — METOPROLOL TARTRATE 5 MG/5ML IV SOLN
5.0000 mg | Freq: Once | INTRAVENOUS | Status: AC
  Administered 2020-02-02: 5 mg via INTRAVENOUS
  Filled 2020-02-01: qty 5

## 2020-02-01 MED ORDER — ALPRAZOLAM 0.5 MG PO TABS: 0.5000 mg | ORAL_TABLET | Freq: Two times a day (BID) | ORAL | Status: DC | PRN

## 2020-02-01 MED ORDER — DILTIAZEM HCL-DEXTROSE 125-5 MG/125ML-% IV SOLN (PREMIX): 5.0000 mg/h | INTRAVENOUS | Status: DC

## 2020-02-01 MED ORDER — DILTIAZEM HCL 60 MG PO TABS: 30.0000 mg | ORAL_TABLET | Freq: Four times a day (QID) | ORAL | Status: DC | PRN

## 2020-02-01 MED ORDER — DILTIAZEM HCL-DEXTROSE 125-5 MG/125ML-% IV SOLN (PREMIX)
5.0000 mg/h | INTRAVENOUS | Status: DC
  Administered 2020-02-01: 5 mg/h via INTRAVENOUS
  Filled 2020-02-01: qty 125

## 2020-02-01 MED ORDER — METOPROLOL TARTRATE 12.5 MG HALF TABLET: 12.5000 mg | ORAL_TABLET | Freq: Two times a day (BID) | ORAL | Status: DC

## 2020-02-01 NOTE — ED Notes (Signed)
Date and time results received: 02/01/20 now  (use smartphrase ".now" to insert current time)  Test: Troponin Critical Value: 1301  Name of Provider Notified: Dr. Darl Householder  Orders Received? Or Actions Taken?: none

## 2020-02-01 NOTE — Telephone Encounter (Signed)
Patient's wife called reporting patient is in the ED at [facility] for afib RVR. He had an ablation yesterday 11/30 and was aware that he could possible go back into afib. Patient was doing well and then this afternoon he felt he was in afib. He checked his watch and rates were elevated. According to his apple watch rates climbed up to 170-180 and would occasionally jump as high as 200. Patient had no chest pain of sob. He felt palpitations. He took 1 dilt 30 mg with minimal improvement. Ultimately patient went to the ER where his heart rates improved to the 120s without intervention. Currently undergoing ER work-up. I said I would inform Dr. Rayann Heman, might need a sooner appointment in the office.    Lakira Ogando Kathlen Mody, PA-C

## 2020-02-01 NOTE — ED Triage Notes (Signed)
Pt arrives with reports of HR of 208 with his smart watch, pt had an ablation yesterday with Dr. Rayann Heman. Pt reports taking diltiazem PTA. Darl Householder MD at bedside. Pt reports he has been on Xarelto for about a month and a half.

## 2020-02-01 NOTE — ED Notes (Signed)
Date and time results received: 02/01/20 9:47 PM   Test: Troponin Critical Value: 1046  Name of Provider Notified: Johney Frame  Orders Received? Or Actions Taken?: None

## 2020-02-01 NOTE — ED Provider Notes (Signed)
[facility] EMERGENCY DEPARTMENT Provider Note   CSN: [id number] Arrival date & time: 02/01/20  1820     History Chief Complaint  Patient presents with  . Tachycardia    Travis Kaufman is a 49 y.o. male history of A. fib on Xarelto here presenting with palpitations and chest pressure.  Patient has had a cardioversion by Dr. Rayann Heman yesterday.  Patient was doing well until this evening and he had sudden onset of palpitations.  He also has some chest pressure at that time.  He checked his apple watch and his heart rate was around 200. He took a dose of his Cardizem and came for evaluation.  Patient states that he has palpitations right now and chest pressure improved.    The history is provided by the patient.       Past Medical History:  Diagnosis Date  . GERD (gastroesophageal reflux disease)     Patient Active Problem List   Diagnosis Date Noted  . Paroxysmal atrial fibrillation ([place]) 04/25/2019  . Atrial fibrillation with RVR ([place]) 04/09/2019  . Cellulitis of right wrist 02/08/2019  . Dysphagia 11/18/2018  . Insomnia 11/18/2018  . Episode of gagging 11/18/2018  . GERD with esophagitis s/p Nissen fundoplication 08/06/2945 65/46/5035  . AKI (acute kidney injury) ([place]) 08/17/2018  . Bradycardia 08/17/2018  . Chest pain   . Angina at rest [facility]) 08/15/2018  . HLD (hyperlipidemia) 07/13/2018  . Nevus 07/13/2018  . Viral upper respiratory tract infection 01/07/2018  . Decreased libido without sexual dysfunction 01/07/2018  . GERD (gastroesophageal reflux disease) 01/07/2018    Past Surgical History:  Procedure Laterality Date  . ATRIAL FIBRILLATION ABLATION N/A 01/31/2020   Procedure: ATRIAL FIBRILLATION ABLATION;  Surgeon: Thompson Grayer, MD;  Location: [place] CV LAB;  Service: Cardiovascular;  Laterality: N/A;  . BIOPSY  08/17/2018   Procedure: BIOPSY;  Surgeon: Ronnette Juniper, MD;  Location: [facility] ENDOSCOPY;  Service: Gastroenterology;;  . ESOPHAGEAL  MANOMETRY N/A 09/24/2018   Procedure: ESOPHAGEAL MANOMETRY (EM);  Surgeon: Ronnette Juniper, MD;  Location: [facility] ENDOSCOPY;  Service: Gastroenterology;  Laterality: N/A;  . ESOPHAGOGASTRODUODENOSCOPY (EGD) WITH PROPOFOL N/A 08/17/2018   Procedure: ESOPHAGOGASTRODUODENOSCOPY (EGD) WITH PROPOFOL;  Surgeon: Ronnette Juniper, MD;  Location: [place];  Service: Gastroenterology;  Laterality: N/A;  . LEFT HEART CATH AND CORONARY ANGIOGRAPHY N/A 08/17/2018   Procedure: LEFT HEART CATH AND CORONARY ANGIOGRAPHY;  Surgeon: Burnell Blanks, MD;  Location: [place] CV LAB;  Service: Cardiovascular;  Laterality: N/A;  . NISSEN FUNDOPLICATION         Family History  Problem Relation Age of Onset  . GER disease Father     Social History   Tobacco Use  . Smoking status: Never Smoker  . Smokeless tobacco: Never Used  Vaping Use  . Vaping Use: Never used  Substance Use Topics  . Alcohol use: Yes    Alcohol/week: 1.0 - 2.0 standard drink    Types: 1 - 2 Glasses of wine per week  . Drug use: Never    Home Medications Prior to Admission medications   Medication Sig Start Date End Date Taking? Authorizing Provider  acetaminophen (TYLENOL) 500 MG tablet Take 500-1,000 mg by mouth 2 (two) times daily as needed for moderate pain or headache.     [provider]  ALPRAZolam Duanne Moron) 1 MG tablet Take 0.5-1 tablets (0.5-1 mg total) by mouth 2 (two) times daily as needed for anxiety (30-60 min prior to flying). 09/20/19   Kennyth Arnold,  FNP  Barberry-Oreg Grape-Goldenseal (BERBERINE COMPLEX PO) Take 1 capsule by mouth in the morning and at bedtime.    [provider]  Cyanocobalamin (VITAMIN B-12 PO) Take 1 tablet by mouth daily.    [provider]  diltiazem (CARDIZEM) 30 MG tablet Take 1 tablet (30 mg total) by mouth every 6 (six) hours as needed (If fast heart rate over 120 at rest). 04/11/19 04/10/20  Daune Perch, NP  EPINEPHrine 0.3 mg/0.3 mL IJ SOAJ injection Inject 0.3 mLs  (0.3 mg total) into the muscle as needed for anaphylaxis. 09/20/19   Dutch Quint B, FNP  FIBER PO Take 3 capsules by mouth in the morning, at noon, and at bedtime. Gastro-Fiber    [provider]  Nutritional Supplements (JUICE PLUS FIBRE PO) Take 8 tablets by mouth daily. Omega (2) + Berry Blend (2) + Vegetable Blend (2) + Fruit Blend (2)    [provider]  pantoprazole (PROTONIX) 40 MG tablet Take 1 tablet (40 mg total) by mouth daily. 01/31/20 03/16/20  [name], Jeneen Rinks, MD  Probiotic Product (PROBIOTIC PO) Take 1 capsule by mouth daily. MegaSporeBiotic    [provider]  rivaroxaban (XARELTO) 20 MG TABS tablet Take 1 tablet (20 mg total) by mouth daily with supper. 01/02/20   [name], Jeneen Rinks, MD    Allergies    Wasp venom and Oxycodone  Review of Systems   Review of Systems  Cardiovascular: Positive for chest pain and palpitations.  All other systems reviewed and are negative.   Physical Exam Updated Vital Signs BP 103/78   Pulse (!) 116   Temp 99.2 F (37.3 C) (Oral)   Resp 12   Ht 5\' 11"  (1.803 m)   Wt 78.9 kg   SpO2 96%   BMI 24.27 kg/m   Physical Exam Vitals and nursing note reviewed.  Constitutional:      Appearance: Normal appearance.  HENT:     Head: Normocephalic.     Nose: Nose normal.     Mouth/Throat:     Mouth: Mucous membranes are moist.  Eyes:     Pupils: Pupils are equal, round, and reactive to light.  Cardiovascular:     Comments: Tachycardic, irregular Pulmonary:     Effort: Pulmonary effort is normal.     Breath sounds: Normal breath sounds.  Abdominal:     General: Abdomen is flat.     Palpations: Abdomen is soft.  Musculoskeletal:        General: Normal range of motion.     Cervical back: Normal range of motion and neck supple.  Skin:    General: Skin is warm.     Capillary Refill: Capillary refill takes less than 2 seconds.  Neurological:     General: No focal deficit present.     Mental Status: He is alert and  oriented to person, place, and time.  Psychiatric:        Mood and Affect: Mood normal.        Behavior: Behavior normal.     ED Results / Procedures / Treatments   Labs (all labs ordered are listed, but only abnormal results are displayed) Labs Reviewed  CBC WITH DIFFERENTIAL/PLATELET - Abnormal; Notable for the following components:      Result Value   WBC 12.1 (*)    Monocytes Absolute 1.1 (*)    All other components within normal limits  TROPONIN I (HIGH SENSITIVITY) - Abnormal; Notable for the following components:   Troponin I (High Sensitivity) 1,301 (*)  All other components within normal limits  COMPREHENSIVE METABOLIC PANEL    EKG EKG Interpretation  Date/Time:  Wednesday February 01 2020 18:26:29 EST Ventricular Rate:  117 PR Interval:    QRS Duration: 82 QT Interval:  307 QTC Calculation: 429 R Axis:   61 Text Interpretation: Atrial fibrillation Nonspecific repol abnormality, diffuse leads last tracing showed sinus rhythm Confirmed by Wandra Arthurs [phone]) on 02/01/2020 6:28:32 PM   Radiology EP STUDY  Result Date: 01/31/2020 SURGEON:  Thompson Grayer, MD PREPROCEDURE DIAGNOSES: 1. Paroxysmal atrial fibrillation. POSTPROCEDURE DIAGNOSES: 1. Paroxysmal  atrial fibrillation. PROCEDURES: 1. Comprehensive electrophysiologic study. 2. Coronary sinus pacing and recording. 3. Three-dimensional mapping of atrial fibrillation 4. Ablation of atrial fibrillation 5. Intracardiac echocardiography. 6. Transseptal puncture of an intact septum. 7. Arrhythmia induction with pacing with isuprel infusion 8. R femoral venous ultrasound guided access INTRODUCTION:  Stanely Sexson is a 49 y.o. male with a history of paroxysmal atrial fibrillation who now presents for EP study and radiofrequency ablation.  The patient reports initially being diagnosed with atrial fibrillation after presenting with symptomatic palpitations and fatgiue. The patient reports increasing frequency and duration  of atrial fibrillation since that time.  The patient therefore presents today for catheter ablation of atrial fibrillation. DESCRIPTION OF PROCEDURE:  Informed written consent was obtained, and the patient was brought to the electrophysiology lab in a fasting state.  The patient was adequately sedated with intravenous medications as outlined in the anesthesia report.  The patient's left and right groins were prepped and draped in the usual sterile fashion by the EP lab staff.  Using a percutaneous Seldinger technique, two 7-French and one 9-French hemostasis sheaths were placed into the right common femoral vein.  Direct Ultrasound guidance was used today for right common femoral venous access with normal vessel patency.  Ultrasound images are captured and stored in the patients chart.  Catheter Placement:  A 7-French Biosense Webster Decapolar coronary sinus catheter was introduced through the right common femoral vein and advanced into the coronary sinus for recording and pacing from this location.  A Biosense Websetwer 3.5 mm Smart Touch SF catheter was introduced through the right common femoral vein and advanced into the right ventricle for recording and pacing.  This catheter was then pulled back to the His bundle location.  A luminal esophageal temperature probe was placed and used for continuous monitoring of the luminal esophageal temperature throughout the procedure as well as to localize the esophagus on fluoroscopy. In addition, the esophagus was directly visualized with intracardiac echo and its positioned marked on Carto.  During ablation at the posterior wall there was limited esophageal heating noted during RF energy delivery with the maximal temperature recorded by the luminal temperature probe of < 37.5 degrees C. Initial Measurements: The patient presented to the electrophysiology lab in sinus rhythm. his PR interval measured 121 msec with a QRS duration of 108 msec and a QT interval of 425 msec.   The AH interval measured 79 msec and the HV interval measured 50 msec.   Ventricular pacing was performed which revealed midline concentric and decremental VA conduction with VA WCL of 300.  Atrial pacing revealed AVWCL of 270 msec. Intracardiac Echocardiography: An 8-French Biosense Webster AcuNav intracardiac echocardiography catheter was introduced through the right common femoral vein and advanced into the right atrium. Intracardiac echocardiography was performed of the left atrium, and a three-dimensional anatomical rendering of the left atrium was performed using CARTO sound technology.  The patient was noted  to have a moderate sized left atrium.  The interatrial septum was prominent but not aneurysmal. All 4 pulmonary veins were visualized and noted to have separate ostia.  The pulmonary veins were moderate in size.  The left atrial appendage was visualized and did not reveal thrombus.   There was no evidence of pulmonary vein stenosis. Transseptal Puncture: The middle right common femoral vein sheath was exchanged for an 8.5 [place] SL2 transseptal sheath and transseptal access was achieved in a standard fashion using a Baylis needle with intracardiac echocardiography confirmation of the transseptal puncture.  Once transseptal access had been achieved, heparin was administered intravenously and intra- arterially in order to maintain an ACT of greater than 350 seconds throughout the procedure. 3D Mapping and Ablation: A 3.5 mm [employer] Touch Thermocool SF ablation catheter was advanced into the right atrium.  The transseptal sheath was pulled back into the IVC over a guidewire.  The ablation catheter was advanced across the transseptal hole using the wire as a guide.  The transseptal sheath was then re-advanced over the guidewire into the left atrium.  A duodecapolar Biosense Webster pentaray mapping catheter was introduced through the transseptal sheath and positioned over the mouth of all 4  pulmonary veins.  Three-dimensional electroanatomical mapping was performed using CARTO technology.  This demonstrated electrical activity within all four pulmonary veins at baseline. The patient underwent successful sequential electrical isolation and anatomical encircling of all four pulmonary veins using radiofrequency current with a pentaray mapping catheter as a guide.  A WACA approach was used.  Entrance and exit block were confirmed. Sure eBay was used with target index of 400 (posterior), 500-550 (anterior).  Ablation was performed at 50 watts.  Measurements Following Ablation: Following ablation, Isuprel was infused up to 20 mcg/min with no inducible atrial fibrillation, atrial tachycardia, atrial flutter, or sustained PACs. In sinus rhythm with RR interval was 911 msec, with PR 138 msec, QRS 87 msec, and Qt 413 msec.  Following ablation the AH interval measured 51 msec with an HV interval of 48 msec.  Rapid atrial pacing was performed, which revealed an AV Wenckebach cycle length of 260 msec.  Electroisolation was then again confirmed in all four pulmonary veins.  Pacing was performed along the ablation line which confirmed entrance and exit block. Intracardiac echo revealed no pericardial effusion at the end of the procedure today. The procedure was therefore considered completed.  All catheters were removed, and the sheaths were aspirated and flushed.  The sheaths were removed and perclose stitch x 3 applied.  The patient was transferred to the recovery area per protocol. EBL<48ml. There were no early apparent complications. CONCLUSIONS: 1. Sinus rhythm upon presentation.  2. Intracardiac echo reveals a moderate sized left atrium with four separate pulmonary veins without evidence of pulmonary vein stenosis. 3. Successful electrical isolation and anatomical encircling of all four pulmonary veins with radiofrequency current. 4. No inducible arrhythmias following ablation both on and off of  Isuprel 5. No early apparent complications. Jeneen Rinks [name],MD 12:25 PM 01/31/2020   Procedures Procedures (including critical care time)  CRITICAL CARE Performed by: Wandra Arthurs   Total critical care time: 30 minutes  Critical care time was exclusive of separately billable procedures and treating other patients.  Critical care was necessary to treat or prevent imminent or life-threatening deterioration.  Critical care was time spent personally by me on the following activities: development of treatment plan with patient and/or surrogate as well as nursing, discussions with consultants,  evaluation of patient's response to treatment, examination of patient, obtaining history from patient or surrogate, ordering and performing treatments and interventions, ordering and review of laboratory studies, ordering and review of radiographic studies, pulse oximetry and re-evaluation of patient's condition.   Medications Ordered in ED Medications  diltiazem (CARDIZEM) injection 10 mg (has no administration in time range)  sodium chloride 0.9 % bolus 1,000 mL (1,000 mLs Intravenous New Bag/Given 02/01/20 1845)  diltiazem (CARDIZEM) injection 10 mg (10 mg Intravenous Given 02/01/20 1845)  rivaroxaban (XARELTO) tablet 20 mg (20 mg Oral Given 02/01/20 1907)    ED Course  I have reviewed the triage vital signs and the nursing notes.  Pertinent labs & imaging results that were available during my care of the patient were reviewed by me and considered in my medical decision making (see chart for details).    MDM Rules/Calculators/A&P                          Davonne Jarnigan is a 49 y.o. male here presenting with chest pressure and palpitations.  Patient had ablation done yesterday.  Patient went back into rapid A. fib today.  Heart rate goes from low 100s to about 150 in the ED. Plan to get electrolytes and troponin.  8:01 PM Patient's troponin is elevated at 1300.  His potassium is unremarkable.   Patient's heart rate still in the 130s despite given Cardizem boluses. Will start patient on Cardizem drip for rapid A. fib.  I discussed case with Dr. Johney Frame from cardiology.  She thinks that the elevated troponin can be from the cardioversion itself.  She wants to hold off on heparin for now.  Patient is chest pain-free right now.  Patient will be admitted by cardiology to stepdown for rapid A. fib and elevated troponin.  Final Clinical Impression(s) / ED Diagnoses Final diagnoses:  None    Rx / DC Orders ED Discharge Orders    None       Drenda Freeze, MD 02/01/20 2002

## 2020-02-02 DIAGNOSIS — R778 Other specified abnormalities of plasma proteins: Secondary | ICD-10-CM | POA: Diagnosis not present

## 2020-02-02 DIAGNOSIS — I4891 Unspecified atrial fibrillation: Secondary | ICD-10-CM | POA: Diagnosis not present

## 2020-02-02 LAB — CBC
HCT: 44.3 % (ref 39.0–52.0)
Hemoglobin: 15 g/dL (ref 13.0–17.0)
MCH: 31.9 pg (ref 26.0–34.0)
MCHC: 33.9 g/dL (ref 30.0–36.0)
MCV: 94.3 fL (ref 80.0–100.0)
Platelets: 305 10*3/uL (ref 150–400)
RBC: 4.7 MIL/uL (ref 4.22–5.81)
RDW: 13.8 % (ref 11.5–15.5)
WBC: 10.6 10*3/uL — ABNORMAL HIGH (ref 4.0–10.5)
nRBC: 0 % (ref 0.0–0.2)

## 2020-02-02 LAB — BASIC METABOLIC PANEL
Anion gap: 10 (ref 5–15)
BUN: <5 mg/dL — ABNORMAL LOW (ref 6–20)
CO2: 25 mmol/L (ref 22–32)
Calcium: 9.1 mg/dL (ref 8.9–10.3)
Chloride: 108 mmol/L (ref 98–111)
Creatinine, Ser: 0.87 mg/dL (ref 0.61–1.24)
GFR, Estimated: >60 mL/min (ref >60)
Glucose, Bld: 109 mg/dL — ABNORMAL HIGH (ref 70–99)
Potassium: 3.9 mmol/L (ref 3.5–5.1)
Sodium: 143 mmol/L (ref 135–145)

## 2020-02-02 LAB — MAGNESIUM: Magnesium: 2 mg/dL (ref 1.7–2.4)

## 2020-02-02 MED ORDER — FLECAINIDE ACETATE 150 MG PO TABS: 75.0000 mg | ORAL_TABLET | Freq: Two times a day (BID) | ORAL | 3 refills | Status: DC

## 2020-02-02 MED ORDER — FLECAINIDE ACETATE 50 MG PO TABS
75.0000 mg | ORAL_TABLET | Freq: Two times a day (BID) | ORAL | Status: DC
  Administered 2020-02-02: 75 mg via ORAL
  Filled 2020-02-02: qty 2

## 2020-02-02 NOTE — Progress Notes (Signed)
   02/02/20 0538  Assess: MEWS Score  Temp 98.1 F (36.7 C)  BP 95/65  Pulse Rate (!) 47  ECG Heart Rate (!) 47  Resp 18  SpO2 98 %  O2 Device Room Air  Assess: MEWS Score  MEWS Temp 0  MEWS Systolic 1  MEWS Pulse 1  MEWS RR 0  MEWS LOC 0  MEWS Score 2  MEWS Score Color Yellow  Assess: if the MEWS score is Yellow or Red  Were vital signs taken at a resting state? Yes  Focused Assessment Change from prior assessment (see assessment flowsheet)  Early Detection of Sepsis Score *See Row Information* Low  MEWS guidelines implemented *See Row Information* Yes  Treat  MEWS Interventions Escalated (See documentation below)  Pain Scale 0-10  Pain Score 0  Take Vital Signs  Increase Vital Sign Frequency  Yellow: Q 2hr X 2 then Q 4hr X 2, if remains yellow, continue Q 4hrs  Escalate  MEWS: Escalate Yellow: discuss with charge nurse/RN and consider discussing with provider and RRT  Notify: Charge Nurse/RN  Name of Charge Nurse/RN Notified Shanell RN  Date Charge Nurse/RN Notified 02/02/20  Time Charge Nurse/RN Notified 0607  Document  Patient Outcome Other (Comment) (stable at the moment )

## 2020-02-02 NOTE — H&P (Signed)
CARDIOLOGY ADMISSION NOTE  Patient ID: Travis Kaufman MRN: [id number] DOB/AGE: 11/30/1970 49 y.o.  Admit date: 02/01/2020 Primary Physician  Thompson Grayer, MD Primary Cardiologist   Fransico Him, MD Chief Complaint    AF with RVR  HPI:  Travis Kaufman is a 49 y.o. male with a history of paroxymal atrial fibrillation s/p PVI ablation (01/31/20) who presents with AF with RVR.   Patient was doing well after his discharge from his ablation procedure on 01/31/20. The afternoon of 02/01/20, he noticed that his heart rates were elevated on his Apple watch with rates that would occasionally jump as high as 200 bpm. He took one 30 mg diltiazem tablet with minimal improvement and ultimately went to the ER for further evaluation.   In the outside ED he was given bolus doses of diltiazem and started on diltiazem gtt. He was also given IV fluids and his nightly dose of rivaroxaban and eventually transferred to [facility]. On my interview at [facility], he was back in AF with RVR (intially in NSR on arrival). He reported feeling palpitations in his chest with associated chest discomfort and shortness of breath. He was otherwise comfortable and playing card games on his phone and trying to take his mind off his fast heart rates. He denied any paroxysmal nocturnal dyspnea/orthopnea, presyncope/syncope, lower extremity edema, claudication, or abdominal distention.    Past Medical History:  Diagnosis Date  . GERD (gastroesophageal reflux disease)     Past Surgical History:  Procedure Laterality Date  . ATRIAL FIBRILLATION ABLATION N/A 01/31/2020   Procedure: ATRIAL FIBRILLATION ABLATION;  Surgeon: Thompson Grayer, MD;  Location: [place] CV LAB;  Service: Cardiovascular;  Laterality: N/A;  . BIOPSY  08/17/2018   Procedure: BIOPSY;  Surgeon: Ronnette Juniper, MD;  Location: [facility] ENDOSCOPY;  Service: Gastroenterology;;  . ESOPHAGEAL MANOMETRY N/A 09/24/2018   Procedure: ESOPHAGEAL MANOMETRY (EM);  Surgeon: Ronnette Juniper,  MD;  Location: [facility] ENDOSCOPY;  Service: Gastroenterology;  Laterality: N/A;  . ESOPHAGOGASTRODUODENOSCOPY (EGD) WITH PROPOFOL N/A 08/17/2018   Procedure: ESOPHAGOGASTRODUODENOSCOPY (EGD) WITH PROPOFOL;  Surgeon: Ronnette Juniper, MD;  Location: [place];  Service: Gastroenterology;  Laterality: N/A;  . LEFT HEART CATH AND CORONARY ANGIOGRAPHY N/A 08/17/2018   Procedure: LEFT HEART CATH AND CORONARY ANGIOGRAPHY;  Surgeon: Burnell Blanks, MD;  Location: [place] CV LAB;  Service: Cardiovascular;  Laterality: N/A;  . NISSEN FUNDOPLICATION      Allergies  Allergen Reactions  . Wasp Venom Anaphylaxis and Hives  . Oxycodone Other (See Comments)    "wired & can't sleep"   Current Facility-Administered Medications on File Prior to Encounter  Medication Dose Route Frequency Provider Last Rate Last Admin  . 0.9 %  sodium chloride infusion   Intravenous Continuous Ronnette Juniper, MD       Current Outpatient Medications on File Prior to Encounter  Medication Sig Dispense Refill  . acetaminophen (TYLENOL) 500 MG tablet Take 500-1,000 mg by mouth 2 (two) times daily as needed for moderate pain or headache.     . ALPRAZolam (XANAX) 1 MG tablet Take 0.5-1 tablets (0.5-1 mg total) by mouth 2 (two) times daily as needed for anxiety (30-60 min prior to flying). 10 tablet 0  . Barberry-Oreg Grape-Goldenseal (BERBERINE COMPLEX PO) Take 1 capsule by mouth in the morning and at bedtime.    . Cyanocobalamin (VITAMIN B-12 PO) Take 1 tablet by mouth daily.    Marland Kitchen diltiazem (CARDIZEM) 30 MG tablet Take 1 tablet (30 mg total) by mouth every 6 (  six) hours as needed (If fast heart rate over 120 at rest). 30 tablet 11  . EPINEPHrine 0.3 mg/0.3 mL IJ SOAJ injection Inject 0.3 mLs (0.3 mg total) into the muscle as needed for anaphylaxis. 1 each 5  . FIBER PO Take 3 capsules by mouth in the morning, at noon, and at bedtime. Gastro-Fiber    . Nutritional Supplements (JUICE PLUS FIBRE PO) Take 8 tablets by mouth daily.  Omega (2) + Berry Blend (2) + Vegetable Blend (2) + Fruit Blend (2)    . pantoprazole (PROTONIX) 40 MG tablet Take 1 tablet (40 mg total) by mouth daily. 45 tablet 0  . Probiotic Product (PROBIOTIC PO) Take 1 capsule by mouth daily. MegaSporeBiotic    . rivaroxaban (XARELTO) 20 MG TABS tablet Take 1 tablet (20 mg total) by mouth daily with supper. 90 tablet 3   Social History   Socioeconomic History  . Marital status: Married    Spouse name: Not on file  . Number of children: Not on file  . Years of education: Not on file  . Highest education level: Not on file  Occupational History  . Not on file  Tobacco Use  . Smoking status: Never Smoker  . Smokeless tobacco: Never Used  Vaping Use  . Vaping Use: Never used  Substance and Sexual Activity  . Alcohol use: Yes    Alcohol/week: 1.0 - 2.0 standard drink    Types: 1 - 2 Glasses of wine per week  . Drug use: Never  . Sexual activity: Not on file  Other Topics Concern  . Not on file  Social History Narrative  . Not on file   Social Determinants of Health   Financial Resource Strain:   . Difficulty of Paying Living Expenses: Not on file  Food Insecurity:   . Worried About Charity fundraiser in the Last Year: Not on file  . Ran Out of Food in the Last Year: Not on file  Transportation Needs:   . Lack of Transportation (Medical): Not on file  . Lack of Transportation (Non-Medical): Not on file  Physical Activity:   . Days of Exercise per Week: Not on file  . Minutes of Exercise per Session: Not on file  Stress:   . Feeling of Stress : Not on file  Social Connections:   . Frequency of Communication with Friends and Family: Not on file  . Frequency of Social Gatherings with Friends and Family: Not on file  . Attends Religious Services: Not on file  . Active Member of Clubs or Organizations: Not on file  . Attends Archivist Meetings: Not on file  . Marital Status: Not on file  Intimate Partner Violence:   .  Fear of Current or Ex-Partner: Not on file  . Emotionally Abused: Not on file  . Physically Abused: Not on file  . Sexually Abused: Not on file    Family History  Problem Relation Age of Onset  . GER disease Father      Review of Systems: [y] = yes, [ ]  = no       General: Weight gain [] ; Weight loss [ ] ; Anorexia [ ] ; Fatigue [ ] ; Fever [ ] ; Chills [ ] ; Weakness [ ]     Cardiac: As reported in HPI  Pulmonary:  As reported in HPI  GI: Vomiting[ ] ; Dysphagia[ ] ; Melena[ ] ; Hematochezia [ ] ; Heartburn[ ] ; Abdominal pain [ ] ; Constipation [ ] ; Diarrhea [ ] ; BRBPR [ ]   GU: Hematuria[ ] ; Dysuria [ ] ; Nocturia[ ]   Vascular: Pain in legs with walking [ ] ; Pain in feet with lying flat [ ] ; Non-healing sores [ ] ; Stroke [ ] ; TIA [ ] ; Slurred speech [ ] ;    Neuro: Headaches[ ] ; Vertigo[ ] ; Seizures[ ] ; Paresthesias[ ] ;Blurred vision [ ] ; Diplopia [ ] ; Vision changes [ ]     Ortho/Skin: Arthritis [ ] ; Joint pain [ ] ; Muscle pain [ ] ; Joint swelling [ ] ; Back Pain [ ] ; Rash [ ]     Psych: Depression[ ] ; Anxiety[ ]     Heme: Bleeding problems [ ] ; Clotting disorders [ ] ; Anemia [ ]     Endocrine: Diabetes [ ] ; Thyroid dysfunction[ ]   Physical Exam: Blood pressure 112/77, pulse 63, temperature 98.2 F (36.8 C), temperature source Oral, resp. rate 19, height 5\' 11"  (1.803 m), weight 78.9 kg, SpO2 96 %.   GENERAL: Patient is afebrile, Vital signs reviewed, Well appearing, Patient appears comfortable, Alert and lucid. EYES: Normal inspection. HEENT:  normocephalic, atraumatic , normal ENT inspection. ORAL:  Moist NECK:  supple, normal inspection. CARD:  Tachycardic; irregularly irregular rhythm, heart sounds normal. RESP:  no respiratory distress, breath sounds normal. ABD: soft, nontender/nondistended MUSC:  normal ROM, non-tender , no pedal edema . SKIN: color normal, no rash, warm, dry . NEURO: awake & alert, lucid, no motor/sensory deficit. Gait stable. PSYCH: mood/affect  normal.   Labs: Lab Results  Component Value Date   BUN <5 (L) 02/02/2020   Lab Results  Component Value Date   CREATININE 0.87 02/02/2020   Lab Results  Component Value Date   NA 143 02/02/2020   K 3.9 02/02/2020   CL 108 02/02/2020   CO2 25 02/02/2020   Lab Results  Component Value Date   TROPONINI <0.03 08/16/2018   Lab Results  Component Value Date   WBC 10.6 (H) 02/02/2020   HGB 15.0 02/02/2020   HCT 44.3 02/02/2020   MCV 94.3 02/02/2020   PLT 305 02/02/2020   Lab Results  Component Value Date   CHOL 235 (H) 04/10/2019   HDL 59 04/10/2019   LDLCALC 161 (H) 04/10/2019   TRIG 74 04/10/2019   CHOLHDL 4.0 04/10/2019   Lab Results  Component Value Date   ALT 24 02/01/2020   AST 29 02/01/2020   ALKPHOS 71 02/01/2020   BILITOT 0.1 (L) 02/01/2020     EKG:  AF with RVR with rates in the 120s  ASSESSMENT AND PLAN:   Travis Kaufman is a 49 y.o. male with a history of paroxymal atrial fibrillation s/p PVI ablation (01/31/20) who presents with AF with RVR.   # AF with RVR - Continue diltiazem gtt - Start Metoprolol 12.5mg  q6hr - Cont to monitor on telemetry - Continue rivaroxaban 20mg  qhs  # Elevated Troponin :: Likely 2/2 to recent ablation. Patient denies anginal pain. Low suspicion for active ACS - No heparin gtt indicated  Signed: Clois Dupes 02/02/2020, 2:44 AM

## 2020-02-02 NOTE — Progress Notes (Signed)
ERAF noted post ablation. Elevated troponin not surprising given extensive ablation.  Anticipate cardioversion and discharge today with close follow-up in the AF clinic (3-5 days).  Thompson Grayer MD, [facility] 02/02/2020 6:42 AM

## 2020-02-02 NOTE — Plan of Care (Signed)
Problem: Education: Goal: Knowledge of General Education information will improve Description: Including pain rating scale, medication(s)/side effects and non-pharmacologic comfort measures 02/02/2020 0957 by Camillia Herter, RN Outcome: Adequate for Discharge 02/02/2020 0801 by Camillia Herter, RN Outcome: Progressing   Problem: Health Behavior/Discharge Planning: Goal: Ability to manage health-related needs will improve 02/02/2020 0957 by Camillia Herter, RN Outcome: Adequate for Discharge 02/02/2020 0801 by Camillia Herter, RN Outcome: Progressing   Problem: Clinical Measurements: Goal: Ability to maintain clinical measurements within normal limits will improve 02/02/2020 0957 by Camillia Herter, RN Outcome: Adequate for Discharge 02/02/2020 0801 by Camillia Herter, RN Outcome: Progressing Goal: Will remain free from infection 02/02/2020 0957 by Camillia Herter, RN Outcome: Adequate for Discharge 02/02/2020 0801 by Camillia Herter, RN Outcome: Progressing Goal: Diagnostic test results will improve 02/02/2020 0957 by Camillia Herter, RN Outcome: Adequate for Discharge 02/02/2020 0801 by Camillia Herter, RN Outcome: Progressing Goal: Respiratory complications will improve 02/02/2020 0957 by Camillia Herter, RN Outcome: Adequate for Discharge 02/02/2020 0801 by Camillia Herter, RN Outcome: Progressing Goal: Cardiovascular complication will be avoided 02/02/2020 0957 by Camillia Herter, RN Outcome: Adequate for Discharge 02/02/2020 0801 by Camillia Herter, RN Outcome: Progressing   Problem: Activity: Goal: Risk for activity intolerance will decrease 02/02/2020 0957 by Camillia Herter, RN Outcome: Adequate for Discharge 02/02/2020 0801 by Camillia Herter, RN Outcome: Progressing   Problem: Nutrition: Goal: Adequate nutrition will be maintained 02/02/2020 0957 by Camillia Herter, RN Outcome: Adequate for Discharge 02/02/2020 0801 by Camillia Herter, RN Outcome: Progressing   Problem:  Coping: Goal: Level of anxiety will decrease 02/02/2020 0957 by Camillia Herter, RN Outcome: Adequate for Discharge 02/02/2020 0801 by Camillia Herter, RN Outcome: Progressing   Problem: Elimination: Goal: Will not experience complications related to bowel motility 02/02/2020 0957 by Camillia Herter, RN Outcome: Adequate for Discharge 02/02/2020 0801 by Camillia Herter, RN Outcome: Progressing Goal: Will not experience complications related to urinary retention 02/02/2020 0957 by Camillia Herter, RN Outcome: Adequate for Discharge 02/02/2020 0801 by Camillia Herter, RN Outcome: Progressing   Problem: Pain Managment: Goal: General experience of comfort will improve 02/02/2020 0957 by Camillia Herter, RN Outcome: Adequate for Discharge 02/02/2020 0801 by Camillia Herter, RN Outcome: Progressing   Problem: Safety: Goal: Ability to remain free from injury will improve 02/02/2020 0957 by Camillia Herter, RN Outcome: Adequate for Discharge 02/02/2020 0801 by Camillia Herter, RN Outcome: Progressing   Problem: Skin Integrity: Goal: Risk for impaired skin integrity will decrease 02/02/2020 0957 by Camillia Herter, RN Outcome: Adequate for Discharge 02/02/2020 0801 by Camillia Herter, RN Outcome: Progressing   Problem: Education: Goal: Knowledge of disease or condition will improve 02/02/2020 0957 by Camillia Herter, RN Outcome: Adequate for Discharge 02/02/2020 0801 by Camillia Herter, RN Outcome: Progressing Goal: Understanding of medication regimen will improve 02/02/2020 0957 by Camillia Herter, RN Outcome: Adequate for Discharge 02/02/2020 0801 by Camillia Herter, RN Outcome: Progressing Goal: Individualized Educational Video(s) 02/02/2020 0957 by Camillia Herter, RN Outcome: Adequate for Discharge 02/02/2020 0801 by Camillia Herter, RN Outcome: Progressing   Problem: Activity: Goal: Ability to tolerate increased activity will improve 02/02/2020 0957 by Camillia Herter, RN Outcome: Adequate for  Discharge 02/02/2020 0801 by Camillia Herter, RN Outcome: Progressing   Problem: Cardiac: Goal: Ability to achieve and maintain adequate cardiopulmonary perfusion will improve 02/02/2020 0957 by  Camillia Herter, RN Outcome: Adequate for Discharge 02/02/2020 0801 by Camillia Herter, RN Outcome: Progressing   Problem: Health Behavior/Discharge Planning: Goal: Ability to safely manage health-related needs after discharge will improve 02/02/2020 0957 by Camillia Herter, RN Outcome: Adequate for Discharge 02/02/2020 0801 by Camillia Herter, RN Outcome: Progressing

## 2020-02-02 NOTE — Discharge Summary (Addendum)
DISCHARGE SUMMARY    Patient ID: Travis Kaufman,  MRN: [id number], DOB/AGE: 05/07/1970 49 y.o.  Admit date: 02/01/2020 Discharge date: 02/02/2020  Primary Care Physician: Thompson Grayer, MD  Electrophysiologist: Thompson Grayer, MD  Primary Discharge Diagnosis:  1. Paroxysmal AFib     CHA2DS2Vasc is zero, on Xarelto, appropriately dosed  Secondary Discharge Diagnosis:  none  Procedures This Admission:  none  Brief HPI: Travis Kaufman is a 49 y.o. male with a history of GERD, paroxysmal AFib atrial fibrillation.  He underwent AFib/PVI ablation with Dr. Rayann Heman 01/31/2020, discharge post procedure without early complications noted. He developed palpitations at home yesterday, his watch noting HR as high at the 200's, he took a PRN diltiazem without any significant improvement and came to the ER.  Hospital Course:  The patient was given diltiazem bolus and his nightly dose of Xarelto, was started on diltiazem gtt, and  admitted, he was also given a dose of lopressor (IV and PO) His labs largely unremarkable except for his HS Trop 1301 > 1046, he is known bt cath last year to have NO CAD, and elevated troponins 2/2 his ablation on Monday. He early morning converted to SB.  At rest/with sleep mid-high 40's and when we entered the room and woke him quickly rose to 50's-60 range.  His BP while awake and talking to dr. Lovena Le 103/73. And he feels well. He has known baseline asymptomatic bradycardia that has limited his medicine options for his AFib. Dr. Lovena Le discussed at length with the patient AFib, post ablation expectations and recurrent/sometimes even more Afib during the healing/inflammatory phase, they recalled Dr. Rayann Heman discussing this though when his HR noted reaching 200's became very worried and that is why they went to the ER. Dr. Lovena Le dicussed strategies going forward with PRN management/pill in the pocket flecainide vs maintenance (BID dosing) medication strategy.   Discussed his resting bradycardia as well. The patient feels most comfortable with prevention strategy to try and prevent recurrence of AFib rather then PRN. Dr. Lovena Le has recommended flecainide 75mg  BID and IF he has AFib then to take his 30mg  diltiazem pill, and can dose Q2hours with the diltiazem if needed, though if not improved after 4 hours or so, or if not feeling well or no improvement in HR,  to seek attention at that point. The patient is very aware of his AFib and symptoms are reliable for him. We have made early AFib clinic follow up for EKG and visit on Monday. The patient and wife were comfortable with this plan  We re discussed his post ablation activity restrictions   Physical Exam: Vitals:   02/02/20 0538 02/02/20 0632 02/02/20 0638 02/02/20 0831  BP: 95/65 (!) 89/64 91/63 103/73  Pulse: (!) 47 (!) 45  60  Resp: 18 18  18   Temp: 98.1 F (36.7 C) 98 F (36.7 C)  98 F (36.7 C)  TempSrc: Oral Oral  Oral  SpO2: 98% 99%  98%  Weight: 79 kg     Height:        GEN- The patient is well appearing, alert and oriented x 3 today.   HEENT: normocephalic, atraumatic; sclera clear, conjunctiva pink; hearing intact; oropharynx clear; neck supple  Lungs- CTA b/l, normal work of breathing.  No wheezes, rales, rhonchi Heart- RRR, bradycardic, no murmurs, rubs or gallops  GI- soft, non-tender, non-distended, bowel sounds present  Extremities- no clubbing, cyanosis, or edema; R groin without hematoma/bruit MS- no significant deformity or atrophy  Skin- warm and dry, no rash or lesion Psych- euthymic mood, full affect Neuro- strength and sensation are intact   Labs:   Lab Results  Component Value Date   WBC 10.6 (H) 02/02/2020   HGB 15.0 02/02/2020   HCT 44.3 02/02/2020   MCV 94.3 02/02/2020   PLT 305 02/02/2020    Recent Labs  Lab 02/01/20 1828 02/01/20 1828 02/02/20 0017  NA 142   < > 143  K 3.5   < > 3.9  CL 108   < > 108  CO2 18*   < > 25  BUN 8   < > <5*   CREATININE 0.90   < > 0.87  CALCIUM 9.1   < > 9.1  PROT 7.5  --   --   BILITOT 0.1*  --   --   ALKPHOS 71  --   --   ALT 24  --   --   AST 29  --   --   GLUCOSE 113*   < > 109*   < > = values in this interval not displayed.     Discharge Medications:  Allergies as of 02/02/2020       Reactions   Wasp Venom Anaphylaxis, Hives   Oxycodone Other (See Comments)   "wired & can't sleep"        Medication List     TAKE these medications    acetaminophen 500 MG tablet Commonly known as: TYLENOL Take 500-1,000 mg by mouth 2 (two) times daily as needed for moderate pain or headache.   ALPRAZolam 1 MG tablet Commonly known as: Xanax Take 0.5-1 tablets (0.5-1 mg total) by mouth 2 (two) times daily as needed for anxiety (30-60 min prior to flying).   B COMPLEX PO Take 1 tablet by mouth daily.   BERBERINE COMPLEX PO Take 1 capsule by mouth in the morning and at bedtime.   diltiazem 30 MG tablet Commonly known as: Cardizem Take 1 tablet (30 mg total) by mouth every 6 (six) hours as needed (If fast heart rate over 120 at rest).   EPINEPHrine 0.3 mg/0.3 mL Soaj injection Commonly known as: EPI-PEN Inject 0.3 mLs (0.3 mg total) into the muscle as needed for anaphylaxis.   flecainide 150 MG tablet Commonly known as: TAMBOCOR Take 0.5 tablets (75 mg total) by mouth every 12 (twelve) hours.   JUICE PLUS FIBRE PO Take 8 tablets by mouth daily. Omega (2) + Berry Blend (2) + Vegetable Blend (2) + Fruit Blend (2)   OVER THE COUNTER MEDICATION Take 1 tablet by mouth 3 (three) times daily. Gastro-fiber   pantoprazole 40 MG tablet Commonly known as: Protonix Take 1 tablet (40 mg total) by mouth daily.   PROBIOTIC PO Take 1 capsule by mouth daily. MegaSporeBiotic   rivaroxaban 20 MG Tabs tablet Commonly known as: XARELTO Take 1 tablet (20 mg total) by mouth daily with supper.        Disposition: Home Discharge Instructions     Diet - low sodium heart healthy    Complete by: As directed    Increase activity slowly   Complete by: As directed        Follow-up Information     [facility].   Specialty: Cardiology Why: 02/06/2020 @ 11:00AM with R. Marlene Lard, [place] Contact information: [address] [id number] [place] [phone]                Duration  of Discharge Encounter: Greater than 30 minutes including physician time.  Signed, Chanetta Marshall, NP 02/02/2020 9:29 AM  EP Attending  Patient seen and examined. Agree with the findings as noted above. The patient is stable for dc this morning having reverted back to NSR. He has rapid atrial fib. He also has sinus node dysfunction. We discussed both pill in the pocket flecainide as well as taking flecainide 75 mg twice daily for a few weeks. He stated that he wanted to be pro-active and would take the flecainide. I told him to hold if HR got below the mid 40's and was sustained.   Carleene Overlie Amdrew Oboyle,MD

## 2020-02-02 NOTE — Progress Notes (Signed)
Patient going into SR and atrial fibrillation throughout the morning. Heart rate ranging from 60s to 160s. After metoprolol, heart rate down to 40s/50s. Stopped diltiazem drip, per protocol. Patient denies shortness of breath or chest pain. He states he does feel his heart palpitate. Soft bp. Will continue to monitor patient closely.

## 2020-02-02 NOTE — Plan of Care (Signed)

## 2020-02-06 ENCOUNTER — Ambulatory Visit (HOSPITAL_COMMUNITY)
Admit: 2020-02-06 | Discharge: 2020-02-06 | Disposition: A | Discharge status: Home / Self Care | Payer: 59 | Attending: Physician Assistant | Admitting: Physician Assistant

## 2020-02-06 ENCOUNTER — Encounter (HOSPITAL_COMMUNITY): Payer: Self-pay | Admitting: Physician Assistant

## 2020-02-06 ENCOUNTER — Other Ambulatory Visit: Payer: Self-pay

## 2020-02-06 VITALS — BP 116/70 | HR 62 | Ht 71.0 in | Wt 174.8 lb

## 2020-02-06 DIAGNOSIS — I480 Paroxysmal atrial fibrillation: Secondary | ICD-10-CM | POA: Insufficient documentation

## 2020-02-06 DIAGNOSIS — Z79899 Other long term (current) drug therapy: Secondary | ICD-10-CM | POA: Insufficient documentation

## 2020-02-06 DIAGNOSIS — Z7901 Long term (current) use of anticoagulants: Secondary | ICD-10-CM | POA: Diagnosis not present

## 2020-02-06 DIAGNOSIS — K219 Gastro-esophageal reflux disease without esophagitis: Secondary | ICD-10-CM | POA: Diagnosis not present

## 2020-02-06 NOTE — Progress Notes (Signed)
Primary Care Physician: Thompson Grayer, MD Primary Cardiologist: Dr Radford Pax Primary Electrophysiologist: Dr Rayann Heman Referring Physician: Dr Annye English Apollos Travis Kaufman is a 49 y.o. male with a history of GERD and paroxysmal atrial fibrillation who presents for follow up in the [facility]. The patient was initially diagnosed with atrial fibrillation on 04/09/19 after presenting to the ER with symptoms of palpitations and heart racing. His watch noted a pulse rate of 170-190. DCCV was offered but patient declined. He was admitted on diltiazem drip and converted to SR. Patient is not on anticoagulation with a CHADS2VASC score of 0. Diltiazem was discontinued 2/2 bradycardia with heart rates in the 30s-40s. Patient denies any specific triggers. He denies significant snoring.  On follow up today, patient is s/p afib ablation with Dr Rayann Heman 01/31/20. He developed afib with RVR the next day and presented to the ED. He was initially in SR but converted to afib during evaluation. He had HR as high as 200 bpm noted on his smart watch. He was started on flecainide. He denies any further episodes of heart racing. He denies CP or swallowing issues. He does have some mild groin swelling and tenderness.   Today, he denies symptoms of palpitations, chest pain, shortness of breath, orthopnea, PND, lower extremity edema, dizziness, presyncope, syncope, snoring, daytime somnolence, bleeding, or neurologic sequela. The patient is tolerating medications without difficulties and is otherwise without complaint today.    Atrial Fibrillation Risk Factors:  he does not have symptoms or diagnosis of sleep apnea. he does not have a history of rheumatic fever. he does have a history of alcohol use. The patient does not have a history of early familial atrial fibrillation or other arrhythmias.  he has a BMI of Body mass index is 24.38 kg/m.Marland Kitchen Filed Weights   02/06/20 1108  Weight: 79.3 kg     Family History  Problem Relation Age of Onset  . GER disease Father      Atrial Fibrillation Management history:  Previous antiarrhythmic drugs: flecainide Previous cardioversions: none Previous ablations: 01/31/20 CHADS2VASC score: 0 Anticoagulation history: none   Past Medical History:  Diagnosis Date  . GERD (gastroesophageal reflux disease)    Past Surgical History:  Procedure Laterality Date  . ATRIAL FIBRILLATION ABLATION N/A 01/31/2020   Procedure: ATRIAL FIBRILLATION ABLATION;  Surgeon: Thompson Grayer, MD;  Location: [facility];  Service: Cardiovascular;  Laterality: N/A;  . BIOPSY  08/17/2018   Procedure: BIOPSY;  Surgeon: Ronnette Juniper, MD;  Location: [facility] ENDOSCOPY;  Service: Gastroenterology;;  . ESOPHAGEAL MANOMETRY N/A 09/24/2018   Procedure: ESOPHAGEAL MANOMETRY (EM);  Surgeon: Ronnette Juniper, MD;  Location: [facility] ENDOSCOPY;  Service: Gastroenterology;  Laterality: N/A;  . ESOPHAGOGASTRODUODENOSCOPY (EGD) WITH PROPOFOL N/A 08/17/2018   Procedure: ESOPHAGOGASTRODUODENOSCOPY (EGD) WITH PROPOFOL;  Surgeon: Ronnette Juniper, MD;  Location: [facility];  Service: Gastroenterology;  Laterality: N/A;  . LEFT HEART CATH AND CORONARY ANGIOGRAPHY N/A 08/17/2018   Procedure: LEFT HEART CATH AND CORONARY ANGIOGRAPHY;  Surgeon: Burnell Blanks, MD;  Location: [facility];  Service: Cardiovascular;  Laterality: N/A;  . NISSEN FUNDOPLICATION      Current Outpatient Medications  Medication Sig Dispense Refill  . acetaminophen (TYLENOL) 500 MG tablet Take 500-1,000 mg by mouth 2 (two) times daily as needed for moderate pain or headache.     . ALPRAZolam (XANAX) 1 MG tablet Take 0.5-1 tablets (0.5-1 mg total) by mouth 2 (two) times daily as needed for anxiety (30-60 min  prior to flying). 10 tablet 0  . B Complex Vitamins (B COMPLEX PO) Take 1 tablet by mouth daily.    Jolyne Loa Grape-Goldenseal (BERBERINE COMPLEX PO) Take 1 capsule by mouth in the morning and at  bedtime.    Marland Kitchen diltiazem (CARDIZEM) 30 MG tablet Take 1 tablet (30 mg total) by mouth every 6 (six) hours as needed (If fast heart rate over 120 at rest). 30 tablet 11  . EPINEPHrine 0.3 mg/0.3 mL IJ SOAJ injection Inject 0.3 mLs (0.3 mg total) into the muscle as needed for anaphylaxis. 1 each 5  . flecainide (TAMBOCOR) 150 MG tablet Take 0.5 tablets (75 mg total) by mouth every 12 (twelve) hours. 30 tablet 3  . Nutritional Supplements (JUICE PLUS FIBRE PO) Take 8 tablets by mouth daily. Omega (2) + Berry Blend (2) + Vegetable Blend (2) + Fruit Blend (2)    . OVER THE COUNTER MEDICATION Take 1 tablet by mouth 3 (three) times daily. Gastro-fiber    . pantoprazole (PROTONIX) 40 MG tablet Take 1 tablet (40 mg total) by mouth daily. 45 tablet 0  . Probiotic Product (PROBIOTIC PO) Take 1 capsule by mouth daily. MegaSporeBiotic    . rivaroxaban (XARELTO) 20 MG TABS tablet Take 1 tablet (20 mg total) by mouth daily with supper. 90 tablet 3   No current facility-administered medications for this encounter.   Facility-Administered Medications Ordered in Other Encounters  Medication Dose Route Frequency Provider Last Rate Last Admin  . 0.9 %  sodium chloride infusion   Intravenous Continuous Ronnette Juniper, MD        Allergies  Allergen Reactions  . Wasp Venom Anaphylaxis and Hives  . Oxycodone Other (See Comments)    "wired & can't sleep"    Social History   Socioeconomic History  . Marital status: Married    Spouse name: Not on file  . Number of children: Not on file  . Years of education: Not on file  . Highest education level: Not on file  Occupational History  . Not on file  Tobacco Use  . Smoking status: Never Smoker  . Smokeless tobacco: Never Used  Vaping Use  . Vaping Use: Never used  Substance and Sexual Activity  . Alcohol use: Yes    Alcohol/week: 1.0 - 2.0 standard drink    Types: 1 - 2 Glasses of wine per week  . Drug use: Never  . Sexual activity: Not on file  Other  Topics Concern  . Not on file  Social History Narrative  . Not on file   Social Determinants of Health   Financial Resource Strain:   . Difficulty of Paying Living Expenses: Not on file  Food Insecurity:   . Worried About Charity fundraiser in the Last Year: Not on file  . Ran Out of Food in the Last Year: Not on file  Transportation Needs:   . Lack of Transportation (Medical): Not on file  . Lack of Transportation (Non-Medical): Not on file  Physical Activity:   . Days of Exercise per Week: Not on file  . Minutes of Exercise per Session: Not on file  Stress:   . Feeling of Stress : Not on file  Social Connections:   . Frequency of Communication with Friends and Family: Not on file  . Frequency of Social Gatherings with Friends and Family: Not on file  . Attends Religious Services: Not on file  . Active Member of Clubs or Organizations: Not on file  .  Attends Archivist Meetings: Not on file  . Marital Status: Not on file  Intimate Partner Violence:   . Fear of Current or Ex-Partner: Not on file  . Emotionally Abused: Not on file  . Physically Abused: Not on file  . Sexually Abused: Not on file     ROS- All systems are reviewed and negative except as per the HPI above.  Physical Exam: Vitals:   02/06/20 1108  BP: 116/70  Pulse: 62  Weight: 79.3 kg  Height: 5\' 11"  (1.803 m)    GEN- The patient is well appearing, alert and oriented x 3 today.   HEENT-head normocephalic, atraumatic, sclera clear, conjunctiva pink, hearing intact, trachea midline. Lungs- Clear to ausculation bilaterally, normal work of breathing Heart- Regular rate and rhythm, no murmurs, rubs or gallops  GI- soft, NT, ND, + BS Extremities- no clubbing, cyanosis, or edema. Small hematoma and ecchymosis R groin, no bruit  MS- no significant deformity or atrophy Skin- no rash or lesion Psych- euthymic mood, full affect Neuro- strength and sensation are intact   Wt Readings from Last 3  Encounters:  02/06/20 79.3 kg  02/02/20 79 kg  01/31/20 78.5 kg    EKG today demonstrates SR HR 62, PR 138, QRS 92, QTc 414  Echo 08/15/18 demonstrated  1. The left ventricle has hyperdynamic systolic function, with an  ejection fraction of >65%. The cavity size was normal. Left ventricular  diastolic parameters were normal. No evidence of left ventricular regional  wall motion abnormalities.  2. The right ventricle has normal systolic function. The cavity was  normal. There is no increase in right ventricular wall thickness. Right  ventricular systolic pressure could not be assessed.  3. The aortic valve is grossly normal.   Epic records are reviewed at length today  CHA2DS2-VASc Score = 0  The patient's score is based upon: CHF History: 0 HTN History: 0 Diabetes History: 0 Stroke History: 0 Vascular Disease History: 0      ASSESSMENT AND PLAN: 1. Paroxysmal Atrial Fibrillation (ICD10:  I48.0) The patient's CHA2DS2-VASc score is 0, indicating a 0.2% annual risk of stroke.   S/p afib ablation 01/31/20 with [facility]. He is in SR today with no further episodes of afib. Continue flecainide 75 mg BID for now. Anticipate this will be short term. Continue diltiazem 30 mg PRN q 4 hours for heart racing. Continue Xarelto 20 mg daily for at least 3 months post ablation with no missed doses.    Follow up in the AF clinic and with Dr Rayann Heman as scheduled.     [facility] [address] [place], [place] [address] [phone] 02/06/2020 11:13 AM

## 2020-03-01 NOTE — Progress Notes (Signed)
Primary Care Physician: Hillis RangeAllred, [name], MD Primary Cardiologist: Dr Mayford Knifeurner Primary Electrophysiologist: Dr [name] Referring Physician: Dr Jilda RocheSchumann   Travis Alison StallingScott Kaufman is a 49 y.o. male with a history of GERD and paroxysmal atrial fibrillation who [facility]. The patient was initially diagnosed with atrial fibrillation on 04/09/19 after presenting to the ER with symptoms of palpitations and heart racing. His watch noted a pulse rate of 170-190. DCCV was offered but patient declined. He was admitted on diltiazem drip and converted to SR. Patient is not on anticoagulation with a CHADS2VASC score of 0. Diltiazem was discontinued 2/2 bradycardia with heart rates in the 30s-40s. Patient denies any specific triggers. He denies significant snoring. Patient is s/p afib ablation with Dr [name] 01/31/20. He developed afib with RVR the next day and presented to the ED. He was initially in SR but converted to afib during evaluation. He had HR as high as 200 bpm noted on his smart watch. He was started on flecainide.   On follow up today, patient reports he has done well since his last visit. He did have one episode of afib lasting ~ 3 hours but his heart rate was much slower than previous episodes and his symptoms were more mild. He denies any bleeding issues on anticoagulation.   Today, he denies symptoms of chest pain, shortness of breath, orthopnea, PND, lower extremity edema, dizziness, presyncope, syncope, snoring, daytime somnolence, bleeding, or neurologic sequela. The patient is tolerating medications without difficulties and is otherwise without complaint today.    Atrial Fibrillation Risk Factors:  he does not have symptoms or diagnosis of sleep apnea. he does not have a history of rheumatic fever. he does have a history of alcohol use. The patient does not have a history of early familial atrial fibrillation or other arrhythmias.  he has  a BMI of Body mass index is 25.47 kg/m.Marland Kitchen. Filed Weights   03/05/20 0910  Weight: 82.8 kg    Family History  Problem Relation Age of Onset  . GER disease Father      Atrial Fibrillation Management history:  Previous antiarrhythmic drugs: flecainide Previous cardioversions: none Previous ablations: 01/31/20 CHADS2VASC score: 0 Anticoagulation history: none   Past Medical History:  Diagnosis Date  . GERD (gastroesophageal reflux disease)    Past Surgical History:  Procedure Laterality Date  . ATRIAL FIBRILLATION ABLATION N/A 01/31/2020   Procedure: ATRIAL FIBRILLATION ABLATION;  Surgeon: Hillis RangeAllred, [name], MD;  Location: [facility];  Service: Cardiovascular;  Laterality: N/A;  . BIOPSY  08/17/2018   Procedure: BIOPSY;  Surgeon: Kerin SalenKarki, Arya, MD;  [facility];  Service: Gastroenterology;;  . ESOPHAGEAL MANOMETRY N/A 09/24/2018   Procedure: ESOPHAGEAL MANOMETRY (EM);  Surgeon: Kerin SalenKarki, Arya, MD;  Location: [facility]OSCOPY;  Service: Gastroenterology;  Laterality: N/A;  . ESOPHAGOGASTRODUODENOSCOPY (EGD) WITH PROPOFOL N/A 08/17/2018   Procedure: ESOPHAGOGASTRODUODENOSCOPY (EGD) WITH PROPOFOL;  Surgeon: Kerin SalenKarki, Arya, MD;  [facility];  Service: Gastroenterology;  Laterality: N/A;  . LEFT HEART CATH AND CORONARY ANGIOGRAPHY N/A 08/17/2018   Procedure: LEFT HEART CATH AND CORONARY ANGIOGRAPHY;  Surgeon: Kathleene HazelMcAlhany, Christopher D, MD;  Location: [facility];  Service: Cardiovascular;  Laterality: N/A;  . NISSEN FUNDOPLICATION      Current Outpatient Medications  Medication Sig Dispense Refill  . acetaminophen (TYLENOL) 500 MG tablet Take 500-1,000 mg by mouth 2 (two) times daily as needed for moderate pain or headache.     . ALPRAZolam Prudy Feeler(XANAX)  1 MG tablet Take 0.5-1 tablets (0.5-1 mg total) by mouth 2 (two) times daily as needed for anxiety (30-60 min prior to flying). 10 tablet 0  . B Complex Vitamins (B COMPLEX PO) Take 1 tablet by mouth daily.    Jolyne Loa Grape-Goldenseal (BERBERINE COMPLEX PO) Take 1 capsule by mouth in the morning and at bedtime.    Marland Kitchen diltiazem (CARDIZEM) 30 MG tablet Take 1 tablet (30 mg total) by mouth every 6 (six) hours as needed (If fast heart rate over 120 at rest). 30 tablet 11  . EPINEPHrine 0.3 mg/0.3 mL IJ SOAJ injection Inject 0.3 mLs (0.3 mg total) into the muscle as needed for anaphylaxis. 1 each 5  . flecainide (TAMBOCOR) 150 MG tablet Take 0.5 tablets (75 mg total) by mouth every 12 (twelve) hours. 30 tablet 3  . Nutritional Supplements (JUICE PLUS FIBRE PO) Take 8 tablets by mouth daily. Omega (2) + Berry Blend (2) + Vegetable Blend (2) + Fruit Blend (2)    . OVER THE COUNTER MEDICATION Take 1 tablet by mouth 3 (three) times daily. Gastro-fiber    . pantoprazole (PROTONIX) 40 MG tablet Take 1 tablet (40 mg total) by mouth daily. 45 tablet 0  . Probiotic Product (PROBIOTIC PO) Take 1 capsule by mouth daily. MegaSporeBiotic    . rivaroxaban (XARELTO) 20 MG TABS tablet Take 1 tablet (20 mg total) by mouth daily with supper. 90 tablet 3   No current facility-administered medications for this encounter.   Facility-Administered Medications Ordered in Other Encounters  Medication Dose Route Frequency Provider Last Rate Last Admin  . 0.9 %  sodium chloride infusion   Intravenous Continuous Ronnette Juniper, MD        Allergies  Allergen Reactions  . Wasp Venom Anaphylaxis and Hives  . Oxycodone Other (See Comments)    "wired & can't sleep"    Social History   Socioeconomic History  . Marital status: Married    Spouse name: Not on file  . Number of children: Not on file  . Years of education: Not on file  . Highest education level: Not on file  Occupational History  . Not on file  Tobacco Use  . Smoking status: Never Smoker  . Smokeless tobacco: Never Used  Vaping Use  . Vaping Use: Never used  Substance and Sexual Activity  . Alcohol use: Yes    Alcohol/week: 1.0 - 2.0 standard drink     Types: 1 - 2 Glasses of wine per week  . Drug use: Never  . Sexual activity: Not on file  Other Topics Concern  . Not on file  Social History Narrative  . Not on file   Social Determinants of Health   Financial Resource Strain: Not on file  Food Insecurity: Not on file  Transportation Needs: Not on file  Physical Activity: Not on file  Stress: Not on file  Social Connections: Not on file  Intimate Partner Violence: Not on file     ROS- All systems are reviewed and negative except as per the HPI above.  Physical Exam: Vitals:   03/05/20 0910  BP: 114/68  Pulse: (!) 51  Weight: 82.8 kg  Height: 5\' 11"  (1.803 m)    GEN- The patient is well appearing, alert and oriented x 3 today.   HEENT-head normocephalic, atraumatic, sclera clear, conjunctiva pink, hearing intact, trachea midline. Lungs- Clear to ausculation bilaterally, normal work of breathing Heart- Regular rate and rhythm, bradycardia, no murmurs, rubs or gallops  GI- soft, NT, ND, + BS Extremities- no clubbing, cyanosis, or edema MS- no significant deformity or atrophy Skin- no rash or lesion Psych- euthymic mood, full affect Neuro- strength and sensation are intact   Wt Readings from Last 3 Encounters:  03/05/20 82.8 kg  02/06/20 79.3 kg  02/02/20 79 kg    EKG today demonstrates SB Vent. rate 51 BPM PR interval 136 ms QRS duration 92 ms QT/QTc 422/388 ms  Echo 08/15/18 demonstrated  1. The left ventricle has hyperdynamic systolic function, with an  ejection fraction of >65%. The cavity size was normal. Left ventricular  diastolic parameters were normal. No evidence of left ventricular regional  wall motion abnormalities.  2. The right ventricle has normal systolic function. The cavity was  normal. There is no increase in right ventricular wall thickness. Right  ventricular systolic pressure could not be assessed.  3. The aortic valve is grossly normal.   Epic records are reviewed at length  today  CHA2DS2-VASc Score = 0  The patient's score is based upon: CHF History: No HTN History: No Diabetes History: No Stroke History: No Vascular Disease History: No      ASSESSMENT AND PLAN: 1. Paroxysmal Atrial Fibrillation (ICD10:  I48.0) The patient's CHA2DS2-VASc score is 0, indicating a 0.2% annual risk of stroke.   S/p afib ablation 01/31/20 Patient appears to be maintaining SR. Continue flecainide 75 mg BID for now. Continue diltiazem 30 mg PRN q 4 hours for heart racing. Continue Xarelto 20 mg daily for at least 3 months post ablation with no missed doses.    Follow up with Dr Johney Frame as scheduled.    Jorja Loa PA-C [facility] [address] [place], [place] [address] [phone] 03/05/2020 9:29 AM

## 2020-03-05 ENCOUNTER — Other Ambulatory Visit: Payer: Self-pay

## 2020-03-05 ENCOUNTER — Encounter (HOSPITAL_COMMUNITY): Payer: Self-pay | Admitting: Physician Assistant

## 2020-03-05 ENCOUNTER — Ambulatory Visit (HOSPITAL_COMMUNITY)
Admission: RE | Admit: 2020-03-05 | Discharge: 2020-03-05 | Disposition: A | Discharge status: Home / Self Care | Payer: 59 | Source: Ambulatory Visit | Attending: Physician Assistant | Admitting: Physician Assistant

## 2020-03-05 VITALS — BP 114/68 | HR 51 | Ht 71.0 in | Wt 182.6 lb

## 2020-03-05 DIAGNOSIS — K219 Gastro-esophageal reflux disease without esophagitis: Secondary | ICD-10-CM | POA: Insufficient documentation

## 2020-03-05 DIAGNOSIS — Z79899 Other long term (current) drug therapy: Secondary | ICD-10-CM | POA: Diagnosis not present

## 2020-03-05 DIAGNOSIS — Z7901 Long term (current) use of anticoagulants: Secondary | ICD-10-CM | POA: Diagnosis not present

## 2020-03-05 DIAGNOSIS — I480 Paroxysmal atrial fibrillation: Secondary | ICD-10-CM | POA: Diagnosis present

## 2020-03-28 ENCOUNTER — Emergency Department (HOSPITAL_COMMUNITY): Payer: 59

## 2020-03-28 ENCOUNTER — Other Ambulatory Visit: Payer: Self-pay

## 2020-03-28 ENCOUNTER — Ambulatory Visit (INDEPENDENT_AMBULATORY_CARE_PROVIDER_SITE_OTHER): Payer: 59 | Admitting: Family Medicine

## 2020-03-28 ENCOUNTER — Encounter: Payer: Self-pay | Admitting: Family Medicine

## 2020-03-28 ENCOUNTER — Inpatient Hospital Stay (HOSPITAL_COMMUNITY)
Admission: EM | Admit: 2020-03-28 | Discharge: 2020-04-03 | DRG: 417 | Disposition: A | Discharge status: Home / Self Care | Payer: 59 | Attending: Internal Medicine | Admitting: Internal Medicine

## 2020-03-28 ENCOUNTER — Encounter (HOSPITAL_COMMUNITY): Payer: Self-pay | Admitting: *Deleted

## 2020-03-28 VITALS — BP 116/78 | HR 78 | Temp 97.5°F | Ht 71.0 in | Wt 179.6 lb

## 2020-03-28 DIAGNOSIS — I482 Chronic atrial fibrillation, unspecified: Secondary | ICD-10-CM

## 2020-03-28 DIAGNOSIS — K810 Acute cholecystitis: Secondary | ICD-10-CM | POA: Diagnosis present

## 2020-03-28 DIAGNOSIS — D62 Acute posthemorrhagic anemia: Secondary | ICD-10-CM | POA: Diagnosis not present

## 2020-03-28 DIAGNOSIS — K219 Gastro-esophageal reflux disease without esophagitis: Secondary | ICD-10-CM | POA: Diagnosis present

## 2020-03-28 DIAGNOSIS — R509 Fever, unspecified: Secondary | ICD-10-CM

## 2020-03-28 DIAGNOSIS — R52 Pain, unspecified: Secondary | ICD-10-CM | POA: Diagnosis not present

## 2020-03-28 DIAGNOSIS — E876 Hypokalemia: Secondary | ICD-10-CM | POA: Diagnosis not present

## 2020-03-28 DIAGNOSIS — I2510 Atherosclerotic heart disease of native coronary artery without angina pectoris: Secondary | ICD-10-CM | POA: Diagnosis present

## 2020-03-28 DIAGNOSIS — D6489 Other specified anemias: Secondary | ICD-10-CM | POA: Diagnosis not present

## 2020-03-28 DIAGNOSIS — L539 Erythematous condition, unspecified: Secondary | ICD-10-CM | POA: Diagnosis present

## 2020-03-28 DIAGNOSIS — Z01810 Encounter for preprocedural cardiovascular examination: Secondary | ICD-10-CM | POA: Diagnosis not present

## 2020-03-28 DIAGNOSIS — K660 Peritoneal adhesions (postprocedural) (postinfection): Secondary | ICD-10-CM | POA: Diagnosis present

## 2020-03-28 DIAGNOSIS — Z20822 Contact with and (suspected) exposure to covid-19: Secondary | ICD-10-CM | POA: Diagnosis present

## 2020-03-28 DIAGNOSIS — Z7982 Long term (current) use of aspirin: Secondary | ICD-10-CM | POA: Diagnosis not present

## 2020-03-28 DIAGNOSIS — Z79899 Other long term (current) drug therapy: Secondary | ICD-10-CM | POA: Diagnosis not present

## 2020-03-28 DIAGNOSIS — Z7901 Long term (current) use of anticoagulants: Secondary | ICD-10-CM

## 2020-03-28 DIAGNOSIS — I480 Paroxysmal atrial fibrillation: Secondary | ICD-10-CM | POA: Diagnosis not present

## 2020-03-28 DIAGNOSIS — K8000 Calculus of gallbladder with acute cholecystitis without obstruction: Principal | ICD-10-CM | POA: Diagnosis present

## 2020-03-28 DIAGNOSIS — Z419 Encounter for procedure for purposes other than remedying health state, unspecified: Secondary | ICD-10-CM | POA: Diagnosis not present

## 2020-03-28 DIAGNOSIS — Z885 Allergy status to narcotic agent status: Secondary | ICD-10-CM | POA: Diagnosis not present

## 2020-03-28 DIAGNOSIS — J189 Pneumonia, unspecified organism: Secondary | ICD-10-CM | POA: Diagnosis not present

## 2020-03-28 DIAGNOSIS — R1011 Right upper quadrant pain: Secondary | ICD-10-CM

## 2020-03-28 HISTORY — DX: Atherosclerotic heart disease of native coronary artery without angina pectoris: I25.10

## 2020-03-28 HISTORY — DX: Paroxysmal atrial fibrillation: I48.0

## 2020-03-28 LAB — COMPREHENSIVE METABOLIC PANEL
ALT: 62 U/L — ABNORMAL HIGH (ref 0–44)
AST: 91 U/L — ABNORMAL HIGH (ref 15–41)
Albumin: 4.1 g/dL (ref 3.5–5.0)
Alkaline Phosphatase: 71 U/L (ref 38–126)
Anion gap: 11 (ref 5–15)
BUN: 12 mg/dL (ref 6–20)
CO2: 21 mmol/L — ABNORMAL LOW (ref 22–32)
Calcium: 8.9 mg/dL (ref 8.9–10.3)
Chloride: 106 mmol/L (ref 98–111)
Creatinine, Ser: 0.87 mg/dL (ref 0.61–1.24)
GFR, Estimated: >60 mL/min (ref >60)
Glucose, Bld: 102 mg/dL — ABNORMAL HIGH (ref 70–99)
Potassium: 4.1 mmol/L (ref 3.5–5.1)
Sodium: 138 mmol/L (ref 135–145)
Total Bilirubin: 0.9 mg/dL (ref 0.3–1.2)
Total Protein: 6.7 g/dL (ref 6.5–8.1)

## 2020-03-28 LAB — URINALYSIS, ROUTINE W REFLEX MICROSCOPIC
Bilirubin Urine: NEGATIVE
Glucose, UA: NEGATIVE mg/dL
Hgb urine dipstick: NEGATIVE
Ketones, ur: 80 mg/dL — AB
Leukocytes,Ua: NEGATIVE
Nitrite: NEGATIVE
Protein, ur: NEGATIVE mg/dL
Specific Gravity, Urine: 1.026 (ref 1.005–1.030)
pH: 6 (ref 5.0–8.0)

## 2020-03-28 LAB — CBC
HCT: 41.6 % (ref 39.0–52.0)
Hemoglobin: 14.5 g/dL (ref 13.0–17.0)
MCH: 32.6 pg (ref 26.0–34.0)
MCHC: 34.9 g/dL (ref 30.0–36.0)
MCV: 93.5 fL (ref 80.0–100.0)
Platelets: 268 10*3/uL (ref 150–400)
RBC: 4.45 MIL/uL (ref 4.22–5.81)
RDW: 13 % (ref 11.5–15.5)
WBC: 15.4 10*3/uL — ABNORMAL HIGH (ref 4.0–10.5)
nRBC: 0 % (ref 0.0–0.2)

## 2020-03-28 LAB — HIV ANTIBODY (ROUTINE TESTING W REFLEX): HIV Screen 4th Generation wRfx: NONREACTIVE

## 2020-03-28 LAB — LIPASE, BLOOD: Lipase: 20 U/L (ref 11–51)

## 2020-03-28 LAB — SARS CORONAVIRUS 2 BY RT PCR (HOSPITAL ORDER, PERFORMED IN ~~LOC~~ HOSPITAL LAB): SARS Coronavirus 2: NEGATIVE

## 2020-03-28 LAB — APTT
aPTT: 33 seconds (ref 24–36)
aPTT: 34 seconds (ref 24–36)

## 2020-03-28 LAB — HEPARIN LEVEL (UNFRACTIONATED): Heparin Unfractionated: >2.2 IU/mL — ABNORMAL HIGH (ref 0.30–0.70)

## 2020-03-28 MED ORDER — HEPARIN (PORCINE) 25000 UT/250ML-% IV SOLN
1550.0000 [IU]/h | INTRAVENOUS | Status: AC
  Administered 2020-03-28: 1300 [IU]/h via INTRAVENOUS
  Administered 2020-03-29: 1550 [IU]/h via INTRAVENOUS
  Filled 2020-03-28 (×2): qty 250

## 2020-03-28 MED ORDER — SODIUM CHLORIDE 0.9% FLUSH
3.0000 mL | Freq: Two times a day (BID) | INTRAVENOUS | Status: DC
  Administered 2020-03-28 – 2020-04-01 (×8): 3 mL via INTRAVENOUS

## 2020-03-28 MED ORDER — FLECAINIDE ACETATE 50 MG PO TABS
75.0000 mg | ORAL_TABLET | Freq: Two times a day (BID) | ORAL | Status: DC
Start: 2020-03-28 — End: 2020-04-03
  Administered 2020-03-28 – 2020-04-03 (×11): 75 mg via ORAL
  Filled 2020-03-28 (×12): qty 2

## 2020-03-28 MED ORDER — ACETAMINOPHEN 650 MG RE SUPP: 650.0000 mg | Freq: Four times a day (QID) | RECTAL | Status: DC | PRN

## 2020-03-28 MED ORDER — ACETAMINOPHEN 325 MG PO TABS
650.0000 mg | ORAL_TABLET | Freq: Four times a day (QID) | ORAL | Status: DC | PRN
  Administered 2020-03-29 – 2020-03-30 (×2): 650 mg via ORAL
  Filled 2020-03-28 (×3): qty 2

## 2020-03-28 MED ORDER — LACTATED RINGERS IV SOLN: INTRAVENOUS | Status: AC

## 2020-03-28 MED ORDER — HYDROMORPHONE HCL 1 MG/ML IJ SOLN
1.0000 mg | Freq: Once | INTRAMUSCULAR | Status: AC
  Administered 2020-03-28: 1 mg via INTRAVENOUS
  Filled 2020-03-28: qty 1

## 2020-03-28 MED ORDER — SODIUM CHLORIDE 0.9 % IV BOLUS
1000.0000 mL | Freq: Once | INTRAVENOUS | Status: AC
  Administered 2020-03-28: 1000 mL via INTRAVENOUS

## 2020-03-28 MED ORDER — PROMETHAZINE HCL 25 MG/ML IJ SOLN: 25.0000 mg | Freq: Once | INTRAVENOUS | Status: DC

## 2020-03-28 MED ORDER — ONDANSETRON HCL 4 MG/2ML IJ SOLN
4.0000 mg | Freq: Once | INTRAMUSCULAR | Status: AC
  Administered 2020-03-28: 4 mg via INTRAVENOUS
  Filled 2020-03-28: qty 2

## 2020-03-28 MED ORDER — ONDANSETRON HCL 4 MG PO TABS
4.0000 mg | ORAL_TABLET | Freq: Four times a day (QID) | ORAL | Status: DC | PRN
  Administered 2020-04-01: 4 mg via ORAL
  Filled 2020-03-28: qty 1

## 2020-03-28 MED ORDER — HEPARIN BOLUS VIA INFUSION
2000.0000 [IU] | Freq: Once | INTRAVENOUS | Status: AC
  Administered 2020-03-28: 2000 [IU] via INTRAVENOUS
  Filled 2020-03-28: qty 2000

## 2020-03-28 MED ORDER — PIPERACILLIN-TAZOBACTAM 3.375 G IVPB 30 MIN
3.3750 g | Freq: Once | INTRAVENOUS | Status: AC
  Administered 2020-03-28: 3.375 g via INTRAVENOUS
  Filled 2020-03-28: qty 50

## 2020-03-28 MED ORDER — ONDANSETRON HCL 4 MG/2ML IJ SOLN
4.0000 mg | Freq: Four times a day (QID) | INTRAMUSCULAR | Status: DC | PRN
  Administered 2020-03-28 – 2020-03-30 (×5): 4 mg via INTRAVENOUS
  Filled 2020-03-28 (×5): qty 2

## 2020-03-28 MED ORDER — MORPHINE SULFATE (PF) 4 MG/ML IV SOLN
4.0000 mg | Freq: Once | INTRAVENOUS | Status: AC
  Administered 2020-03-28: 4 mg via INTRAVENOUS
  Filled 2020-03-28: qty 1

## 2020-03-28 MED ORDER — HYDROMORPHONE HCL 1 MG/ML IJ SOLN
0.5000 mg | INTRAMUSCULAR | Status: DC | PRN
  Administered 2020-03-28 – 2020-04-01 (×20): 1 mg via INTRAVENOUS
  Administered 2020-04-01: 0.5 mg via INTRAVENOUS
  Administered 2020-04-01 (×5): 1 mg via INTRAVENOUS
  Administered 2020-04-02: 0.5 mg via INTRAVENOUS
  Filled 2020-03-28 (×31): qty 1

## 2020-03-28 MED ORDER — PIPERACILLIN-TAZOBACTAM 3.375 G IVPB
3.3750 g | Freq: Three times a day (TID) | INTRAVENOUS | Status: DC
  Administered 2020-03-28 – 2020-04-02 (×15): 3.375 g via INTRAVENOUS
  Filled 2020-03-28 (×15): qty 50

## 2020-03-28 MED ORDER — ACETAMINOPHEN 325 MG PO TABS
650.0000 mg | ORAL_TABLET | Freq: Once | ORAL | Status: AC
  Administered 2020-03-28: 650 mg via ORAL
  Filled 2020-03-28: qty 2

## 2020-03-28 MED ORDER — SODIUM CHLORIDE 0.9 % IV SOLN
25.0000 mg | Freq: Once | INTRAVENOUS | Status: AC
  Administered 2020-03-28: 25 mg via INTRAVENOUS
  Filled 2020-03-28: qty 1

## 2020-03-28 NOTE — ED Provider Notes (Signed)
[facility] DEPT Provider Note   CSN: [id number] Arrival date & time: 03/28/20  1020     History Chief Complaint  Patient presents with  . Abdominal Pain    Travis Kaufman is a 50 y.o. male.  The history is provided by the patient.  Abdominal Pain Pain location:  R flank and RUQ Pain quality: aching   Pain radiates to:  Does not radiate Pain severity:  Moderate Onset quality:  Gradual Duration:  12 days Timing:  Constant Progression:  Unchanged Chronicity:  New Context: eating and previous surgery (nissen)   Context: not sick contacts and not suspicious food intake   Relieved by:  Nothing Worsened by:  Nothing Associated symptoms: nausea   Associated symptoms: no chest pain, no chills, no cough, no dysuria, no fever, no hematuria, no shortness of breath, no sore throat and no vomiting   Risk factors: multiple surgeries        Past Medical History:  Diagnosis Date  . Coronary artery disease   . GERD (gastroesophageal reflux disease)     Patient Active Problem List   Diagnosis Date Noted  . Elevated troponin   . Rapid atrial fibrillation ([place]) 02/01/2020  . Paroxysmal atrial fibrillation ([place]) 04/25/2019  . Atrial fibrillation with RVR ([place]) 04/09/2019  . Cellulitis of right wrist 02/08/2019  . Ingrown nail of great toe of left foot 01/17/2019  . Dysphagia 11/18/2018  . Insomnia 11/18/2018  . Episode of gagging 11/18/2018  . GERD with esophagitis s/p Nissen fundoplication 06/07/9627 52/84/1324  . AKI (acute kidney injury) ([place]) 08/17/2018  . Bradycardia 08/17/2018  . Chest pain   . Angina at rest [facility]) 08/15/2018  . HLD (hyperlipidemia) 07/13/2018  . Nevus 07/13/2018  . Viral upper respiratory tract infection 01/07/2018  . Decreased libido without sexual dysfunction 01/07/2018  . GERD (gastroesophageal reflux disease) 01/07/2018    Past Surgical History:  Procedure Laterality Date  . ATRIAL FIBRILLATION ABLATION N/A  01/31/2020   Procedure: ATRIAL FIBRILLATION ABLATION;  Surgeon: Thompson Grayer, MD;  Location: [facility];  Service: Cardiovascular;  Laterality: N/A;  . BIOPSY  08/17/2018   Procedure: BIOPSY;  Surgeon: Ronnette Juniper, MD;  Location: [facility] ENDOSCOPY;  Service: Gastroenterology;;  . ESOPHAGEAL MANOMETRY N/A 09/24/2018   Procedure: ESOPHAGEAL MANOMETRY (EM);  Surgeon: Ronnette Juniper, MD;  Location: [facility] ENDOSCOPY;  Service: Gastroenterology;  Laterality: N/A;  . ESOPHAGOGASTRODUODENOSCOPY (EGD) WITH PROPOFOL N/A 08/17/2018   Procedure: ESOPHAGOGASTRODUODENOSCOPY (EGD) WITH PROPOFOL;  Surgeon: Ronnette Juniper, MD;  Location: [place];  Service: Gastroenterology;  Laterality: N/A;  . LEFT HEART CATH AND CORONARY ANGIOGRAPHY N/A 08/17/2018   Procedure: LEFT HEART CATH AND CORONARY ANGIOGRAPHY;  Surgeon: Burnell Blanks, MD;  Location: [place] [facility];  Service: Cardiovascular;  Laterality: N/A;  . NISSEN FUNDOPLICATION         Family History  Problem Relation Age of Onset  . GER disease Father     Social History   Tobacco Use  . Smoking status: Never Smoker  . Smokeless tobacco: Never Used  Vaping Use  . Vaping Use: Never used  Substance Use Topics  . Alcohol use: Yes    Alcohol/week: 1.0 - 2.0 standard drink    Types: 1 - 2 Glasses of wine per week  . Drug use: Never    Home Medications Prior to Admission medications   Medication Sig Start Date End Date Taking? Authorizing Provider  acetaminophen (TYLENOL) 500 MG tablet Take 500-1,000 mg by mouth 2 (  two) times daily as needed for moderate pain or headache.    Yes [provider]  ALPRAZolam Duanne Moron) 1 MG tablet Take 0.5-1 tablets (0.5-1 mg total) by mouth 2 (two) times daily as needed for anxiety (30-60 min prior to flying). Patient taking differently: Take 0.5-1 mg by mouth 2 (two) times daily as needed for anxiety. 09/20/19  Yes Dutch Quint B, FNP  Aspirin-Acetaminophen-Caffeine (EXCEDRIN PO) Take 1 tablet by mouth  daily as needed (headache).   Yes [provider]  B Complex Vitamins (B COMPLEX PO) Take 1 tablet by mouth daily.   Yes [provider]  Barberry-Oreg Grape-Goldenseal (BERBERINE COMPLEX PO) Take 1 capsule by mouth in the morning and at bedtime.   Yes [provider]  EPINEPHrine 0.3 mg/0.3 mL IJ SOAJ injection Inject 0.3 mLs (0.3 mg total) into the muscle as needed for anaphylaxis. Patient taking differently: Inject 0.3 mg into the muscle once. 09/20/19  Yes Dutch Quint B, FNP  flecainide (TAMBOCOR) 150 MG tablet Take 0.5 tablets (75 mg total) by mouth every 12 (twelve) hours. 02/02/20  Yes Baldwin Jamaica, PA-C  Nutritional Supplements (JUICE PLUS FIBRE PO) Take 8 tablets by mouth daily. Omega (2) + Berry Blend (2) + Vegetable Blend (2) + Fruit Blend (2)   Yes [provider]  OVER THE COUNTER MEDICATION Take 1 tablet by mouth 3 (three) times daily. Gastro-fiber   Yes [provider]  Probiotic Product (PROBIOTIC PO) Take 1 capsule by mouth daily. MegaSporeBiotic   Yes [provider]  rivaroxaban (XARELTO) 20 MG TABS tablet Take 1 tablet (20 mg total) by mouth daily with supper. 01/02/20  Yes [name], Jeneen Rinks, MD  diltiazem (CARDIZEM) 30 MG tablet Take 1 tablet (30 mg total) by mouth every 6 (six) hours as needed (If fast heart rate over 120 at rest). Patient not taking: No sig reported 04/11/19 04/10/20  Daune Perch, NP  pantoprazole (PROTONIX) 40 MG tablet Take 1 tablet (40 mg total) by mouth daily. Patient not taking: Reported on 03/28/2020 01/31/20 03/16/20  Thompson Grayer, MD    Allergies    Wasp venom and Oxycodone  Review of Systems   Review of Systems  Constitutional: Negative for chills and fever.  HENT: Negative for ear pain and sore throat.   Eyes: Negative for pain and visual disturbance.  Respiratory: Negative for cough and shortness of breath.   Cardiovascular: Negative for chest pain and palpitations.  Gastrointestinal:  Positive for abdominal pain and nausea. Negative for vomiting.  Genitourinary: Negative for dysuria and hematuria.  Musculoskeletal: Negative for arthralgias and back pain.  Skin: Negative for color change and rash.  Neurological: Negative for seizures and syncope.  All other systems reviewed and are negative.   Physical Exam Updated Vital Signs  ED Triage Vitals  Enc Vitals Group     BP 03/28/20 1027 (!) 117/100     Pulse Rate 03/28/20 1027 65     Resp 03/28/20 1027 18     Temp 03/28/20 1027 99.4 F (37.4 C)     Temp Source 03/28/20 1027 Oral     SpO2 03/28/20 1027 100 %     Weight --      Height --      Head Circumference --      Peak Flow --      Pain Score 03/28/20 1028 9     Pain Loc --      Pain Edu? --      Excl. in [place]? --  Physical Exam Vitals and nursing note reviewed.  Constitutional:      General: He is not in acute distress.    Appearance: He is well-developed and well-nourished. He is not ill-appearing.  HENT:     Head: Normocephalic and atraumatic.     Mouth/Throat:     Mouth: Mucous membranes are moist.  Eyes:     Extraocular Movements: Extraocular movements intact.     Conjunctiva/sclera: Conjunctivae normal.  Cardiovascular:     Rate and Rhythm: Normal rate and regular rhythm.     Heart sounds: Normal heart sounds. No murmur heard.   Pulmonary:     Effort: Pulmonary effort is normal. No respiratory distress.     Breath sounds: Normal breath sounds.  Abdominal:     General: Bowel sounds are normal.     Palpations: Abdomen is soft.     Tenderness: There is abdominal tenderness in the right upper quadrant. There is right CVA tenderness and guarding.  Musculoskeletal:        General: No edema.     Cervical back: Neck supple.  Skin:    General: Skin is warm and dry.     Capillary Refill: Capillary refill takes less than 2 seconds.  Neurological:     General: No focal deficit present.     Mental Status: He is alert.  Psychiatric:         Mood and Affect: Mood and affect normal.     ED Results / Procedures / Treatments   Labs (all labs ordered are listed, but only abnormal results are displayed) Labs Reviewed  COMPREHENSIVE METABOLIC PANEL - Abnormal; Notable for the following components:      Result Value   CO2 21 (*)    Glucose, Bld 102 (*)    AST 91 (*)    ALT 62 (*)    All other components within normal limits  CBC - Abnormal; Notable for the following components:   WBC 15.4 (*)    All other components within normal limits  URINALYSIS, ROUTINE W REFLEX MICROSCOPIC - Abnormal; Notable for the following components:   Ketones, ur 80 (*)    All other components within normal limits  SARS CORONAVIRUS 2 BY RT PCR (HOSPITAL ORDER, [facility])  LIPASE, BLOOD    EKG EKG Interpretation  Date/Time:  Wednesday March 28 2020 11:39:38 EST Ventricular Rate:  56 PR Interval:  126 QRS Duration: 88 QT Interval:  444 QTC Calculation: 428 R Axis:   56 Text Interpretation: Sinus bradycardia Otherwise normal ECG Confirmed by Lennice Sites [phone]) on 03/28/2020 11:56:07 AM   Radiology CT Renal Stone Study  Result Date: 03/28/2020 CLINICAL DATA:  Right flank pain and right upper abdominal pain starting about midnight last night, worse with movement. EXAM: CT ABDOMEN AND PELVIS WITHOUT CONTRAST TECHNIQUE: Multidetector CT imaging of the abdomen and pelvis was performed following the standard protocol without IV contrast. COMPARISON:  Overlapping portion of cardiac CT from 01/24/2020 FINDINGS: Lower chest: Unremarkable Hepatobiliary: Abnormal gallbladder wall thickening and pericholecystic fluid. Accentuated density in the gallbladder could be from gallstones, sludge, or debris. No CBD or common hepatic duct dilatation. Noncontrast 0.6 by 0.3 cm calcification or calcific nodule along the posterior margin of the right hepatic lobe on image 74 of series 6, significance uncertain but probably chronic.  Pancreas: Unremarkable Spleen: Unremarkable Adrenals/Urinary Tract: Unremarkable Stomach/Bowel: Postoperative findings from fundoplication. Normal appendix. Otherwise unremarkable. Vascular/Lymphatic: Mild aortoiliac atherosclerotic vascular calcification. No pathologic adenopathy identified. Reproductive: Unremarkable  Other: No supplemental non-categorized findings. Musculoskeletal: Lumbar spondylosis and degenerative disc disease. IMPRESSION: 1. Abnormal gallbladder wall thickening and pericholecystic fluid, suspicious for acute cholecystitis. Correlate with clinical presentation. Accentuated density in the gallbladder could be from gallstones, sludge, or debris. 2. Postoperative findings from fundoplication. 3. Lumbar spondylosis and degenerative disc disease. 4. Aortic atherosclerosis. Aortic Atherosclerosis (ICD10-I70.0). Electronically Signed   By: Van Clines M.D.   On: 03/28/2020 11:44   US Abdomen Limited RUQ (LIVER/GB)  Result Date: 03/28/2020 CLINICAL DATA:  Pain EXAM: ULTRASOUND ABDOMEN LIMITED RIGHT UPPER QUADRANT COMPARISON:  CT 03/28/2020 FINDINGS: Gallbladder: Sludge noted within the gallbladder lumen. There is mild gallbladder wall thickening with minimal pericholecystic fluid. Sonographic Murphy sign positive per technologist. Common bile duct: Diameter: 5 mm Liver: No focal lesion identified. Within normal limits in parenchymal echogenicity. Portal vein is patent on color Doppler imaging with normal direction of blood flow towards the liver. Other: None. IMPRESSION: Sonographic findings suspicious for acute cholecystitis. Electronically Signed   By: Miachel Roux M.D.   On: 03/28/2020 12:37    Procedures Procedures   Medications Ordered in ED Medications  piperacillin-tazobactam (ZOSYN) IVPB 3.375 g (has no administration in time range)  acetaminophen (TYLENOL) tablet 650 mg (has no administration in time range)  sodium chloride 0.9 % bolus 1,000 mL (1,000 mLs Intravenous New  Bag/Given 03/28/20 1119)  ondansetron (ZOFRAN) injection 4 mg (4 mg Intravenous Given 03/28/20 1120)  morphine 4 MG/ML injection 4 mg (4 mg Intravenous Given 03/28/20 1145)  HYDROmorphone (DILAUDID) injection 1 mg (1 mg Intravenous Given 03/28/20 1332)    ED Course  I have reviewed the triage vital signs and the nursing notes.  Pertinent labs & imaging results that were available during my care of the patient were reviewed by me and considered in my medical decision making (see chart for details).    MDM Rules/Calculators/A&P                          Kala Gratz is a 50 year old male with history of reflux, CAD presents the ED with right-sided abdominal pain.  Patient with overall unremarkable vitals.  Low-grade temperature.  Patient with pain for the last 12 hours mostly right-sided in the right flank and right upper quadrant.  Has had Nissen fundoplication in the past.  Tender in the right upper quadrant and right CVA.  No urinary symptoms.  Suspect kidney stone versus cholecystitis versus pancreatitis versus possibly musculoskeletal process.  Could be UTI/pyelonephritis.  Will check basic labs, CT scan abdomen pelvis.  Will give IV fluids, IV Zofran, IV fluids and reevaluate.  Patient with white count of 15.  AST and ALT mildly elevated bili bilirubin normal.  Lipase normal.  Ultrasound shows acute cholecystitis as well as CT scan abdomen and pelvis.  General surgery consulted and recommend admission to medicine as patient is on Xarelto and has a history of CAD.  IV Zosyn was given.  Patient to be admitted to medicine.  This chart was dictated using voice recognition software.  Despite best efforts to proofread,  errors can occur which can change the documentation meaning.    Final Clinical Impression(s) / ED Diagnoses Final diagnoses:  Pain  Acute cholecystitis    Rx / DC Orders ED Discharge Orders    None       Lennice Sites, DO 03/28/20 1341

## 2020-03-28 NOTE — Progress Notes (Signed)
Per Dr. Rayann Heman ok to hold xarelto even though early for post ablation.  Will add IV heparin per pharmacy for now.

## 2020-03-28 NOTE — Progress Notes (Signed)
[facility] [address] [place] [address] Dept: [phone] Dept Fax: [phone]  Acute Office Visit  Subjective:    Patient ID: Travis Kaufman, male    DOB: 07-Dec-1970, 50 y.o..   MRN: [id number]   Chief Complaint  Patient presents with  . Back Pain    Lower back and abdominal pain started around midnight.    Patient is in today complaining of sudden onset of mid-back pain starting around mid-night. He notes this awakened him from sleep. He notes pain has been constant, sharp and severe. He states this is escalating, currently at a 9/10 on a pain scale. He also complains of pain in both flanks, R>L. He has had associated nausea with mild vomiting (limited due to prior Nissen fundoplication). He notes a small amount of blood tinge with this. He has felt clammy and had some chills, but no fever per se. He denies diarrhea. He had eaten pulled pork for dinner and felt well when he went to bed. He had a work-out with his trainer around noon yesterday and denies any specific injury. He denies any new areas of numbness, weakness or tingling in his lower extremities. He is urinating and denies any hematuria.  Past Medical History Patient Active Problem List   Diagnosis Date Noted  . Elevated troponin   . Rapid atrial fibrillation ([place]) 02/01/2020  . Paroxysmal atrial fibrillation ([place]) 04/25/2019  . Atrial fibrillation with RVR ([place]) 04/09/2019  . Cellulitis of right wrist 02/08/2019  . Dysphagia 11/18/2018  . Insomnia 11/18/2018  . Episode of gagging 11/18/2018  . GERD with esophagitis s/p Nissen fundoplication 09/04/9447 67/59/1638  . AKI (acute kidney injury) ([place]) 08/17/2018  . Bradycardia 08/17/2018  . Chest pain   . Angina at rest [facility]) 08/15/2018  . HLD (hyperlipidemia) 07/13/2018  . Nevus 07/13/2018  . Viral upper respiratory tract infection 01/07/2018  . Decreased libido without sexual dysfunction 01/07/2018  .  GERD (gastroesophageal reflux disease) 01/07/2018    Past Surgical History:  Procedure Laterality Date  . ATRIAL FIBRILLATION ABLATION N/A 01/31/2020   Procedure: ATRIAL FIBRILLATION ABLATION;  Surgeon: Thompson Grayer, MD;  Location: [facility];  Service: Cardiovascular;  Laterality: N/A;  . BIOPSY  08/17/2018   Procedure: BIOPSY;  Surgeon: Ronnette Juniper, MD;  Location: [facility] ENDOSCOPY;  Service: Gastroenterology;;  . ESOPHAGEAL MANOMETRY N/A 09/24/2018   Procedure: ESOPHAGEAL MANOMETRY (EM);  Surgeon: Ronnette Juniper, MD;  Location: [facility] ENDOSCOPY;  Service: Gastroenterology;  Laterality: N/A;  . ESOPHAGOGASTRODUODENOSCOPY (EGD) WITH PROPOFOL N/A 08/17/2018   Procedure: ESOPHAGOGASTRODUODENOSCOPY (EGD) WITH PROPOFOL;  Surgeon: Ronnette Juniper, MD;  Location: [facility];  Service: Gastroenterology;  Laterality: N/A;  . LEFT HEART CATH AND CORONARY ANGIOGRAPHY N/A 08/17/2018   Procedure: LEFT HEART CATH AND CORONARY ANGIOGRAPHY;  Surgeon: Burnell Blanks, MD;  Location: [place] [facility];  Service: Cardiovascular;  Laterality: N/A;  . NISSEN FUNDOPLICATION      Family History  Problem Relation Age of Onset  . GER disease Father     Outpatient Medications Prior to Visit  Medication Sig Dispense Refill  . acetaminophen (TYLENOL) 500 MG tablet Take 500-1,000 mg by mouth 2 (two) times daily as needed for moderate pain or headache.     . ALPRAZolam (XANAX) 1 MG tablet Take 0.5-1 tablets (0.5-1 mg total) by mouth 2 (two) times daily as needed for anxiety (30-60 min prior to flying). 10 tablet 0  . B Complex Vitamins (B COMPLEX PO) Take 1  tablet by mouth daily.    Jolyne Loa Grape-Goldenseal (BERBERINE COMPLEX PO) Take 1 capsule by mouth in the morning and at bedtime.    Marland Kitchen diltiazem (CARDIZEM) 30 MG tablet Take 1 tablet (30 mg total) by mouth every 6 (six) hours as needed (If fast heart rate over 120 at rest). 30 tablet 11  . EPINEPHrine 0.3 mg/0.3 mL IJ SOAJ injection Inject 0.3 mLs (0.3  mg total) into the muscle as needed for anaphylaxis. 1 each 5  . flecainide (TAMBOCOR) 150 MG tablet Take 0.5 tablets (75 mg total) by mouth every 12 (twelve) hours. 30 tablet 3  . Nutritional Supplements (JUICE PLUS FIBRE PO) Take 8 tablets by mouth daily. Omega (2) + Berry Blend (2) + Vegetable Blend (2) + Fruit Blend (2)    . OVER THE COUNTER MEDICATION Take 1 tablet by mouth 3 (three) times daily. Gastro-fiber    . Probiotic Product (PROBIOTIC PO) Take 1 capsule by mouth daily. MegaSporeBiotic    . rivaroxaban (XARELTO) 20 MG TABS tablet Take 1 tablet (20 mg total) by mouth daily with supper. 90 tablet 3  . pantoprazole (PROTONIX) 40 MG tablet Take 1 tablet (40 mg total) by mouth daily. 45 tablet 0   Facility-Administered Medications Prior to Visit  Medication Dose Route Frequency Provider Last Rate Last Admin  . 0.9 %  sodium chloride infusion   Intravenous Continuous Ronnette Juniper, MD        Allergies  Allergen Reactions  . Wasp Venom Anaphylaxis and Hives  . Oxycodone Other (See Comments)    "wired & can't sleep"      Objective:    Vitals:   03/28/20 0908  Weight: 179 lb 9.6 oz (81.5 kg)  Height: 5\' 11"  (1.803 m)   General: Patient sitting bent over in moderate distress. Mild diaphoresis. Abdomen: Soft. Tender in right upper and right lower quadrant with some guarding at both. RUQ seems more pronounced than RLQ.  Murphy's sign positive. No hepatosplenomegaly. Mild rebound on right. Back: Straight. No CVA tenderness bilaterally. No pain with palpation over back muscles.   Assessment & Plan:   1. Acute abdominal pain in right upper quadrant  [name] presents with a complaint of back pain, but his exam is consistent with an acute abdominal process. His pain is more consistent with gallbladder or pancreatic pain, but I cannot rule-out an acute appendicitis. I have advised [name] that his current presentation would necessitate evaluation in an ED, with urgent lab and  potentially ultrasound or CT imaging. He has requested to have an ambulance transport him for care. EMS was called. Report was provided to EMTs. Patient was transported in stable condition.  Haydee Salter, MD

## 2020-03-28 NOTE — Progress Notes (Signed)
A consult was received from an ED physician for zosyn per pharmacy dosing.  The patient's profile has been reviewed for ht/wt/allergies/indication/available labs.    A one time order has been placed for zosyn 3.375 gm IV x1 .  Further antibiotics/pharmacy consults should be ordered by admitting physician if indicated.                       Thank you, Lynelle Doctor 03/28/2020  1:30 PM

## 2020-03-28 NOTE — Progress Notes (Signed)
[facility] for Heparin Infusion Indication: atrial fibrillation and recent ablation (8 weeks)  Allergies  Allergen Reactions  . Wasp Venom Anaphylaxis and Hives  . Oxycodone Other (See Comments)    "wired & can't sleep"    Patient Measurements:   Heparin Dosing Weight: 81.5 kg  Vital Signs: Temp: 98.8 F (37.1 C) (01/26 1337) Temp Source: Oral (01/26 1337) BP: 119/69 (01/26 2214) Pulse Rate: 51 (01/26 2214)  Labs: Recent Labs    03/28/20 1238 03/28/20 1553 03/28/20 2253  HGB 14.5  --   --   HCT 41.6  --   --   PLT 268  --   --   APTT  --  33 34  HEPARINUNFRC  --  >2.20*  --   CREATININE 0.87  --   --     Estimated Creatinine Clearance: 109.4 mL/min (by C-G formula based on SCr of 0.87 mg/dL).   Medical History: Past Medical History:  Diagnosis Date  . Coronary artery disease   . GERD (gastroesophageal reflux disease)     Medications:  Xarelto 20 mg daily with supper PTA last dose 1/25  Assessment: 50 y/o M with a h/o atrial fibrillation and recent ablation on Xarelto admitted with acute cholecystitis. Plan is to bridge patient with heparin for Xarelto washout prior to surgery. Baseline HL > 2.2, aPTT 33  03/28/2020 APTT 34 subtherapeutic on 1300 units/hr No line issues or bleeding per RN  Goal of Therapy:  Heparin level 0.3-0.7 units/ml APTT 66-102  Monitor platelets by anticoagulation protocol: Yes   Plan:  Heparin bolus 2000 units x 1 Increase heparin drip to 1550 units/hr check aPTT in 6 hours  HL/CBC daily Dose by aPTT until levels correlate Monitor for s/s of bleeding  Dolly Rias RPh 03/28/2020, 11:51 PM

## 2020-03-28 NOTE — ED Triage Notes (Addendum)
BIB EMS about midnight, nausea, rt upper abd pain. [id number]/22-44 #18 L wrist, total of 100 mcg Fentanyl

## 2020-03-28 NOTE — Progress Notes (Signed)
ANTICOAGULATION CONSULT NOTE - Initial Consult  Pharmacy Consult for Heparin Infusion Indication: atrial fibrillation and recent ablation (8 weeks)  Allergies  Allergen Reactions  . Wasp Venom Anaphylaxis and Hives  . Oxycodone Other (See Comments)    "wired & can't sleep"    Patient Measurements:   Heparin Dosing Weight: 81.5 kg  Vital Signs: Temp: 98.8 F (37.1 C) (01/26 1337) Temp Source: Oral (01/26 1337) BP: 117/66 (01/26 1433) Pulse Rate: 54 (01/26 1433)  Labs: Recent Labs    03/28/20 1238  HGB 14.5  HCT 41.6  PLT 268  CREATININE 0.87    Estimated Creatinine Clearance: 109.4 mL/min (by C-G formula based on SCr of 0.87 mg/dL).   Medical History: Past Medical History:  Diagnosis Date  . Coronary artery disease   . GERD (gastroesophageal reflux disease)     Medications:  Xarelto 20 mg daily with supper PTA last dose 1/25  Assessment: 50 y/o M with a h/o atrial fibrillation and recent ablation on Xarelto admitted with acute cholecystitis. Plan is to bridge patient with heparin for Xarelto washout prior to surgery.   Goal of Therapy:  Heparin level 0.3-0.7 units/ml APTT 66-102  Monitor platelets by anticoagulation protocol: Yes   Plan:  Will initiate heparin infusion at 1300 units/hr this PM (~24 hours from last Xarelto dose) Will check aPTT 6 hours after starting infusion  HL and aPTT at baseline HL/CBC daily Dose by aPTT until levels correlate Monitor for s/s of bleeding  Ulice Dash D 03/28/2020,3:58 PM

## 2020-03-28 NOTE — Progress Notes (Signed)
  Re: EP recommendations for holding [place] in setting of acute cholecystitis given recent AF ablation (01/31/2020).   Pt was 8 weeks post op as of yesterday, 03/27/2020.  Per Dr. Rayann Heman we are ok with pausing anticoagulation, as long as the window is kept as short as possible.   I discussed these recommendations with Dr. Neysa Bonito, Will Creig Hines PA-C with gen surg, and Dr. Sallyanne Kuster of Gen Cards.   Thank you for your care of this patient.   Legrand Como [id number] [place], PA-C  03/28/2020 3:28 PM

## 2020-03-28 NOTE — H&P (Signed)
History and Physical        Hospital Admission Note Date: 03/28/2020  Patient name: Travis Kaufman [facility] Medical record number: [id number] Date of birth: Jul 25, 1970 Age: 50 y.o. Gender: male  PCP: Thompson Grayer, MD   Chief Complaint    Chief Complaint  Patient presents with  . Abdominal Pain      HPI:   This is a 50 year old male with past medical history of atrial fibrillation s/p ablation on 01/31/2020 with Dr. Rayann Heman currently on flecainide and Xarelto, GERD s/p Nissen fundoplication who presented to the ED with sudden onset RUQ and right flank pain since midnight last night associated with nausea and dry heaving.  Currently, patient states that he feels a bit better than he did this morning but still is very uncomfortable.  Had an episode of diarrhea yesterday with scant blood but believes this could be from a hemorrhoid.  States that he did have a fatty meal yesterday.  Denies chest pain, palpitations, shortness of breath or other issues.   ED Course: Afebrile, hemodynamically stable, on room air. Notable Labs: Sodium 138, K4.1, CO2 21, glucose 102, BUN 12, creatinine 0.87, AST 91, ALT 62, WBC 15.4, UA with 80 ketones and otherwise unremarkable. Notable Imaging: CT renal stone study-abnormal gallbladder wall thickening and pericholecystic fluid suspicious for acute cholecystitis.  RUQ [place] suspicious for acute cholecystitis.  General surgery was consulted. Patient received Zosyn, 1 L NS bolus, Dilaudid, morphine, Tylenol.    Vitals:   03/28/20 1156 03/28/20 1337  BP: 106/76   Pulse: (!) 55   Resp: 19   Temp:  98.8 F (37.1 C)  SpO2: 100%      Review of Systems:  Review of Systems  All other systems reviewed and are negative.   Medical/Social/Family History   Past Medical History: Past Medical History:  Diagnosis Date  . Coronary artery disease   . GERD  (gastroesophageal reflux disease)     Past Surgical History:  Procedure Laterality Date  . ATRIAL FIBRILLATION ABLATION N/A 01/31/2020   Procedure: ATRIAL FIBRILLATION ABLATION;  Surgeon: Thompson Grayer, MD;  Location: [place] [facility];  Service: Cardiovascular;  Laterality: N/A;  . BIOPSY  08/17/2018   Procedure: BIOPSY;  Surgeon: Ronnette Juniper, MD;  Location: [facility];  Service: Gastroenterology;;  . ESOPHAGEAL MANOMETRY N/A 09/24/2018   Procedure: ESOPHAGEAL MANOMETRY (EM);  Surgeon: Ronnette Juniper, MD;  Location: [facility]OSCOPY;  Service: Gastroenterology;  Laterality: N/A;  . ESOPHAGOGASTRODUODENOSCOPY (EGD) WITH PROPOFOL N/A 08/17/2018   Procedure: ESOPHAGOGASTRODUODENOSCOPY (EGD) WITH PROPOFOL;  Surgeon: Ronnette Juniper, MD;  Location: [place];  Service: Gastroenterology;  Laterality: N/A;  . LEFT HEART CATH AND CORONARY ANGIOGRAPHY N/A 08/17/2018   Procedure: LEFT HEART CATH AND CORONARY ANGIOGRAPHY;  Surgeon: Burnell Blanks, MD;  Location: [place] [facility];  Service: Cardiovascular;  Laterality: N/A;  . NISSEN FUNDOPLICATION      Medications: Prior to Admission medications   Medication Sig Start Date End Date Taking? Authorizing Provider  acetaminophen (TYLENOL) 500 MG tablet Take 500-1,000 mg by mouth 2 (two) times daily as needed for moderate pain or headache.    Yes [provider]  ALPRAZolam Duanne Moron) 1 MG tablet Take 0.5-1 tablets (0.5-1 mg total)  by mouth 2 (two) times daily as needed for anxiety (30-60 min prior to flying). Patient taking differently: Take 0.5-1 mg by mouth 2 (two) times daily as needed for anxiety. 09/20/19  Yes Dutch Quint B, FNP  Aspirin-Acetaminophen-Caffeine (EXCEDRIN PO) Take 1 tablet by mouth daily as needed (headache).   Yes [provider]  B Complex Vitamins (B COMPLEX PO) Take 1 tablet by mouth daily.   Yes [provider]  Barberry-Oreg Grape-Goldenseal (BERBERINE COMPLEX PO) Take 1 capsule by mouth in the morning  and at bedtime.   Yes [provider]  EPINEPHrine 0.3 mg/0.3 mL IJ SOAJ injection Inject 0.3 mLs (0.3 mg total) into the muscle as needed for anaphylaxis. Patient taking differently: Inject 0.3 mg into the muscle once. 09/20/19  Yes Dutch Quint B, FNP  flecainide (TAMBOCOR) 150 MG tablet Take 0.5 tablets (75 mg total) by mouth every 12 (twelve) hours. 02/02/20  Yes Baldwin Jamaica, PA-C  Nutritional Supplements (JUICE PLUS FIBRE PO) Take 8 tablets by mouth daily. Omega (2) + Berry Blend (2) + Vegetable Blend (2) + Fruit Blend (2)   Yes [provider]  OVER THE COUNTER MEDICATION Take 1 tablet by mouth 3 (three) times daily. Gastro-fiber   Yes [provider]  Probiotic Product (PROBIOTIC PO) Take 1 capsule by mouth daily. MegaSporeBiotic   Yes [provider]  rivaroxaban (XARELTO) 20 MG TABS tablet Take 1 tablet (20 mg total) by mouth daily with supper. 01/02/20  Yes [name], Jeneen Rinks, MD  diltiazem (CARDIZEM) 30 MG tablet Take 1 tablet (30 mg total) by mouth every 6 (six) hours as needed (If fast heart rate over 120 at rest). Patient not taking: No sig reported 04/11/19 04/10/20  Daune Perch, NP  pantoprazole (PROTONIX) 40 MG tablet Take 1 tablet (40 mg total) by mouth daily. Patient not taking: Reported on 03/28/2020 01/31/20 03/16/20  Thompson Grayer, MD    Allergies:   Allergies  Allergen Reactions  . Wasp Venom Anaphylaxis and Hives  . Oxycodone Other (See Comments)    "wired & can't sleep"    Social History:  reports that he has never smoked. He has never used smokeless tobacco. He reports current alcohol use of about 1.0 - 2.0 standard drink of alcohol per week. He reports that he does not use drugs.  Family History: Family History  Problem Relation Age of Onset  . GER disease Father      Objective   Physical Exam: Blood pressure 106/76, pulse (!) 55, temperature 98.8 F (37.1 C), temperature source Oral, resp. rate 19, SpO2 100  %.  Physical Exam Vitals and nursing note reviewed.  Constitutional:      Appearance: Normal appearance.  HENT:     Head: Normocephalic and atraumatic.  Eyes:     Conjunctiva/sclera: Conjunctivae normal.  Cardiovascular:     Rate and Rhythm: Normal rate and regular rhythm.  Pulmonary:     Effort: Pulmonary effort is normal.     Breath sounds: Normal breath sounds.  Abdominal:     General: Abdomen is flat.     Palpations: Abdomen is soft.     Tenderness: There is generalized abdominal tenderness and tenderness in the right upper quadrant.  Musculoskeletal:        General: No swelling or tenderness.  Skin:    Coloration: Skin is not jaundiced or pale.  Neurological:     Mental Status: He is alert. Mental status is at baseline.  Psychiatric:  Mood and Affect: Mood normal.        Behavior: Behavior normal.     LABS on Admission: I have personally reviewed all the labs and imaging below    Basic Metabolic Panel: Recent Labs  Lab 03/28/20 1238  NA 138  K 4.1  CL 106  CO2 21*  GLUCOSE 102*  BUN 12  CREATININE 0.87  CALCIUM 8.9   Liver Function Tests: Recent Labs  Lab 03/28/20 1238  AST 91*  ALT 62*  ALKPHOS 71  BILITOT 0.9  PROT 6.7  ALBUMIN 4.1   Recent Labs  Lab 03/28/20 1238  LIPASE 20   No results for input(s): AMMONIA in the last 168 hours. CBC: Recent Labs  Lab 03/28/20 1238  WBC 15.4*  HGB 14.5  HCT 41.6  MCV 93.5  PLT 268   Cardiac Enzymes: No results for input(s): CKTOTAL, CKMB, CKMBINDEX, TROPONINI in the last 168 hours. BNP: Invalid input(s): POCBNP CBG: No results for input(s): GLUCAP in the last 168 hours.  Radiological Exams on Admission:  CT Renal Stone Study  Result Date: 03/28/2020 CLINICAL DATA:  Right flank pain and right upper abdominal pain starting about midnight last night, worse with movement. EXAM: CT ABDOMEN AND PELVIS WITHOUT CONTRAST TECHNIQUE: Multidetector CT imaging of the abdomen and pelvis was performed  following the standard protocol without IV contrast. COMPARISON:  Overlapping portion of cardiac CT from 01/24/2020 FINDINGS: Lower chest: Unremarkable Hepatobiliary: Abnormal gallbladder wall thickening and pericholecystic fluid. Accentuated density in the gallbladder could be from gallstones, sludge, or debris. No CBD or common hepatic duct dilatation. Noncontrast 0.6 by 0.3 cm calcification or calcific nodule along the posterior margin of the right hepatic lobe on image 74 of series 6, significance uncertain but probably chronic. Pancreas: Unremarkable Spleen: Unremarkable Adrenals/Urinary Tract: Unremarkable Stomach/Bowel: Postoperative findings from fundoplication. Normal appendix. Otherwise unremarkable. Vascular/Lymphatic: Mild aortoiliac atherosclerotic vascular calcification. No pathologic adenopathy identified. Reproductive: Unremarkable Other: No supplemental non-categorized findings. Musculoskeletal: Lumbar spondylosis and degenerative disc disease. IMPRESSION: 1. Abnormal gallbladder wall thickening and pericholecystic fluid, suspicious for acute cholecystitis. Correlate with clinical presentation. Accentuated density in the gallbladder could be from gallstones, sludge, or debris. 2. Postoperative findings from fundoplication. 3. Lumbar spondylosis and degenerative disc disease. 4. Aortic atherosclerosis. Aortic Atherosclerosis (ICD10-I70.0). Electronically Signed   By: Van Clines M.D.   On: 03/28/2020 11:44   US Abdomen Limited RUQ (LIVER/GB)  Result Date: 03/28/2020 CLINICAL DATA:  Pain EXAM: ULTRASOUND ABDOMEN LIMITED RIGHT UPPER QUADRANT COMPARISON:  CT 03/28/2020 FINDINGS: Gallbladder: Sludge noted within the gallbladder lumen. There is mild gallbladder wall thickening with minimal pericholecystic fluid. Sonographic Murphy sign positive per technologist. Common bile duct: Diameter: 5 mm Liver: No focal lesion identified. Within normal limits in parenchymal echogenicity. Portal vein is  patent on color Doppler imaging with normal direction of blood flow towards the liver. Other: None. IMPRESSION: Sonographic findings suspicious for acute cholecystitis. Electronically Signed   By: Miachel Roux M.D.   On: 03/28/2020 12:37      EKG: normal EKG, normal sinus rhythm   A & P   Active Problems:   * No active hospital problems. *   1. Suspected acute cholecystitis a. No stones seen on imaging b. General surgery on board: Hold Xarelto, CLD, IV hydration, Zosyn.  Recommend cardiology clearance.  HIDA scan to confirm diagnosis plan for surgery around 1/28 c. Zofran as needed  2. Atrial fibrillation s/p ablation on 01/31/2020 with Dr. Rayann Heman a. Currently in sinus rhythm b. On flecainide  and Xarelto outpatient c. Discussed with cardiology PA: continue flecainide and hold Xarelto and start heparin bridge d. Cardiology consulted for cardiac clearance    DVT prophylaxis: Heparin   Code Status: Prior  Diet: Clear liquid diet Family Communication: Admission, patients condition and plan of care including tests being ordered have been discussed with the patient who indicates understanding and agrees with the plan and Code Status. Patient's wife was updated  Disposition Plan: The appropriate patient status for this patient is INPATIENT. Inpatient status is judged to be reasonable and necessary in order to provide the required intensity of service to ensure the patient's safety. The patient's presenting symptoms, physical exam findings, and initial radiographic and laboratory data in the context of their chronic comorbidities is felt to place them at high risk for further clinical deterioration. Furthermore, it is not anticipated that the patient will be medically stable for discharge from the hospital within 2 midnights of admission. The following factors support the patient status of inpatient.   " The patient's presenting symptoms include abdominal pain, nausea. " The worrisome  physical exam findings include abdominal pain. " The initial radiographic and laboratory data are worrisome because of acute cholecystitis. " The chronic co-morbidities include atrial fibrillation.   * I certify that at the point of admission it is my clinical judgment that the patient will require inpatient hospital care spanning beyond 2 midnights from the point of admission due to high intensity of service, high risk for further deterioration and high frequency of surveillance required.*   The medical decision making on this patient was of high complexity and the patient is at high risk for clinical deterioration, therefore this is a level 3  admission.  Consultants  . General surgery . Cardiology  Procedures  . None  Time Spent on Admission: 72 minutes    Harold Hedge, DO [facility]  03/28/2020, 2:10 PM

## 2020-03-28 NOTE — Progress Notes (Signed)
Pharmacy Antibiotic Note  Travis Kaufman is a 50 y.o. male presented to the ED from PCP office on 03/28/2020 for workup of abdominal pain. US abdomen showed findings with concern for acute cholecystitis.  Pharmacy has been consulted to start zosyn for intra-abdominal infection.  Plan: - zosyn 3.375 gm IV q8h (infuse over 4 hrs) - With stable renal function, pharmacy will sign off for abx consult.  Reconsult [place] if need further assistance.  ________________________________________ Temp (24hrs), Avg:98.6 F (37 C), Min:97.5 F (36.4 C), Max:99.4 F (37.4 C)  Recent Labs  Lab 03/28/20 1238  WBC 15.4*  CREATININE 0.87    Estimated Creatinine Clearance: 109.4 mL/min (by C-G formula based on SCr of 0.87 mg/dL).    Allergies  Allergen Reactions  . Wasp Venom Anaphylaxis and Hives  . Oxycodone Other (See Comments)    "wired & can't sleep"     Thank you for allowing pharmacy to be a part of this patient's care.  Lynelle Doctor 03/28/2020 2:39 PM

## 2020-03-28 NOTE — Consult Note (Signed)
[facility] Surgery                                                                      Consult Note Travis Kaufman [facility] [birth year]  [id number].    Requesting MD: Lennice Sites Chief Complaint: Back pain, nausea and vomiting Reason for Consult: Possible acute cholecystitis  HPI:  Patient is a 50 year old who presented to the ED with complaints of back pain, abdominal pain, nausea that started acutely last PM. He cannot vomit with Nissen.  Pain is mostly right side/flank to his back.  He has a history of atrial fibrillation with RVR.  He underwent ablation of atrial fibrillation on 01/31/2020 by Dr. Thompson Grayer.  He is on chronic anticoagulation.  He history of  GERD and Niesen fundoplication 09/08/27, Dr. Johney Maine.  Work-up in the ED: Shows he is afebrile, T-max 99.4, blood pressure 117/100, sats are good on room air.  CMP is normal except for CO2 twenty-one, glucose of 102, AST ninety-one ALT sixty-two total bilirubin normal at 0.9. Lipase 20,  WBC 15.4, H/H 14.5/41.6, platelets 268,000.  Covid is negative.  A CT renal stone study shows abnormal gallbladder wall thickening and pericholecystic fluid possible gallstones sludge or debris in the gallbladder.  No CBD or hepatic duct dilatation.  There is thought to be possible acute cholecystitis.  There is also postoperative findings from previous fundoplication, lumbar spondylosis and degenerative disc disease along with atherosclerotic disease.  Abdominal ultrasound shows sludge within the gallbladder lumen, mild gallbladder wall thickening with minimal pericholecystic fluid.  CBD was 5 mm.  This was also suspicious for acute cholecystitis.  We are asked to see.  Last dose of Xarelto: 7PM 03/27/20  ROS: Review of Systems  Constitutional: Positive for fever (99 range).  HENT: Negative.   Eyes: Negative.   Respiratory: Negative.   Cardiovascular: Negative.   Gastrointestinal: Positive for abdominal pain (mostly right flank and back pain.), diarrhea  (some last PM with onset of pain), heartburn, nausea and vomiting (can't vomit with Nissen ). Negative for blood in stool (They did some occult study with [facility] and said there was a trace of blood, he has not seen any), constipation and melena.       GI upset with fatty foods since Nissen, on and [facility] course of probiotics to help with GI symptoms since Nissen.   Genitourinary:       Some burning currently  Musculoskeletal: Positive for back pain (pain right flank to the back).  Skin: Negative.   Neurological: Negative.   Endo/Heme/Allergies: Negative.   Psychiatric/Behavioral: Negative.     Family History  Problem Relation Age of Onset  . GER disease Father     Past Medical History:  Diagnosis Date  . Coronary artery disease   . GERD (gastroesophageal reflux disease)     Past Surgical History:  Procedure Laterality Date  . ATRIAL FIBRILLATION ABLATION N/A 01/31/2020   Procedure: ATRIAL FIBRILLATION ABLATION;  Surgeon: Thompson Grayer, MD;  Location: [facility] [facility];  Service: Cardiovascular;  Laterality: N/A;  . BIOPSY  08/17/2018   Procedure: BIOPSY;  Surgeon: Ronnette Juniper, MD;  Location: [place];  Service: Gastroenterology;;  . ESOPHAGEAL MANOMETRY N/A 09/24/2018   Procedure: ESOPHAGEAL MANOMETRY (EM);  Surgeon: Ronnette Juniper, MD;  Location: [facility];  Service: Gastroenterology;  Laterality: N/A;  . ESOPHAGOGASTRODUODENOSCOPY (EGD) WITH PROPOFOL N/A 08/17/2018   Procedure: ESOPHAGOGASTRODUODENOSCOPY (EGD) WITH PROPOFOL;  Surgeon: Ronnette Juniper, MD;  Location: [place];  Service: Gastroenterology;  Laterality: N/A;  . LEFT HEART CATH AND CORONARY ANGIOGRAPHY N/A 08/17/2018   Procedure: LEFT HEART CATH AND CORONARY ANGIOGRAPHY;  Surgeon: Burnell Blanks, MD;  Location: [place] [facility];  Service: Cardiovascular;  Laterality: N/A;  . NISSEN FUNDOPLICATION      Social History:  reports that he has never smoked. He has never used  smokeless tobacco. He reports current alcohol use of about 1.0 - 2.0 standard drink of alcohol per week. He reports that he does not use drugs.  Allergies:  Allergies  Allergen Reactions  . Wasp Venom Anaphylaxis and Hives  . Oxycodone Other (See Comments)    "wired & can't sleep"    Prior to Admission medications   Medication Sig Start Date End Date Taking? Authorizing Provider  acetaminophen (TYLENOL) 500 MG tablet Take 500-1,000 mg by mouth 2 (two) times daily as needed for moderate pain or headache.    Yes [provider]  ALPRAZolam Duanne Moron) 1 MG tablet Take 0.5-1 tablets (0.5-1 mg total) by mouth 2 (two) times daily as needed for anxiety (30-60 min prior to flying). Patient taking differently: Take 0.5-1 mg by mouth 2 (two) times daily as needed for anxiety. 09/20/19  Yes Dutch Quint B, FNP  Aspirin-Acetaminophen-Caffeine (EXCEDRIN PO) Take 1 tablet by mouth daily as needed (headache).   Yes [provider]  B Complex Vitamins (B COMPLEX PO) Take 1 tablet by mouth daily.   Yes [provider]  Barberry-Oreg Grape-Goldenseal (BERBERINE COMPLEX PO) Take 1 capsule by mouth in the morning and at bedtime.   Yes [provider]  EPINEPHrine 0.3 mg/0.3 mL IJ SOAJ injection Inject 0.3 mLs (0.3 mg total) into the muscle as needed for anaphylaxis. Patient taking differently: Inject 0.3 mg into the muscle once. 09/20/19  Yes Dutch Quint B, FNP  flecainide (TAMBOCOR) 150 MG tablet Take 0.5 tablets (75 mg total) by mouth every 12 (twelve) hours. 02/02/20  Yes Baldwin Jamaica, PA-C  Nutritional Supplements (JUICE PLUS FIBRE PO) Take 8 tablets by mouth daily. Omega (2) + Berry Blend (2) + Vegetable Blend (2) + Fruit Blend (2)   Yes [provider]  OVER THE COUNTER MEDICATION Take 1 tablet by mouth 3 (three) times daily. Gastro-fiber   Yes [provider]  Probiotic Product (PROBIOTIC PO) Take 1 capsule by mouth daily. MegaSporeBiotic   Yes  [provider]  rivaroxaban (XARELTO) 20 MG TABS tablet Take 1 tablet (20 mg total) by mouth daily with supper. 01/02/20  Yes [name], Jeneen Rinks, MD  diltiazem (CARDIZEM) 30 MG tablet Take 1 tablet (30 mg total) by mouth every 6 (six) hours as needed (If fast heart rate over 120 at rest). Patient not taking: No sig reported 04/11/19 04/10/20  Daune Perch, NP  pantoprazole (PROTONIX) 40 MG tablet Take 1 tablet (40 mg total) by mouth daily. Patient not taking: Reported on 03/28/2020 01/31/20 03/16/20  [name], Jeneen Rinks, MD     Blood pressure 106/76, pulse (!) 55, temperature 99.4 F (37.4 C), temperature source Oral, resp. rate 19, SpO2 100 %. Physical Exam:  General: pleasant, WD, WN white male who is laying in bed in NAD HEENT: head is normocephalic, atraumatic.  Sclera are noninjected.  PERRL.  Ears and nose without any masses  or lesions.  Mouth is pink and moist Heart: regular, rate, and rhythm.  Normal s1,s2. No obvious murmurs, gallops, or rubs noted.  Palpable radial and pedal pulses bilaterally Lungs: CTAB, no wheezes, rhonchi, or rales noted.  Respiratory effort nonlabored Abd: soft, NT, ND, +BS, no masses, hernias, or organomegaly MS: all 4 extremities are symmetrical with no cyanosis, clubbing, or edema. Skin: warm and dry with no masses, lesions, or rashes Neuro: Cranial nerves 2-12 grossly intact, sensation is normal throughout Psych: A&Ox3 with an appropriate affect.   Results for orders placed or performed during the hospital encounter of 03/28/20 (from the past 48 hour(s))  Urinalysis, Routine w reflex microscopic Urine, Clean Catch     Status: Abnormal   Collection Time: 03/28/20 11:50 AM  Result Value Ref Range   Color, Urine YELLOW YELLOW   APPearance CLEAR CLEAR   Specific Gravity, Urine 1.026 1.005 - 1.030   pH 6.0 5.0 - 8.0   Glucose, UA NEGATIVE NEGATIVE mg/dL   Hgb urine dipstick NEGATIVE NEGATIVE   Bilirubin Urine NEGATIVE NEGATIVE   Ketones, ur 80 (A) NEGATIVE  mg/dL   Protein, ur NEGATIVE NEGATIVE mg/dL   Nitrite NEGATIVE NEGATIVE   Leukocytes,Ua NEGATIVE NEGATIVE    Comment: Performed at [facility], [place] [address] [place], [place] [address]  SARS Coronavirus 2 by RT PCR (hospital order, performed in [facility] hospital lab) Nasopharyngeal Nasopharyngeal Swab     Status: None   Collection Time: 03/28/20 12:07 PM   Specimen: Nasopharyngeal Swab  Result Value Ref Range   SARS Coronavirus 2 NEGATIVE NEGATIVE    Comment: (NOTE) SARS-CoV-2 target nucleic acids are NOT DETECTED.  The SARS-CoV-2 RNA is generally detectable in upper and lower respiratory specimens during the acute phase of infection. The lowest concentration of SARS-CoV-2 viral copies this assay can detect is 250 copies / mL. A negative result does not preclude SARS-CoV-2 infection and should not be used as the sole basis for treatment or other patient management decisions.  A negative result may occur with improper specimen collection / handling, submission of specimen other than nasopharyngeal swab, presence of viral mutation(s) within the areas targeted by this assay, and inadequate number of viral copies (<250 copies / mL). A negative result must be combined with clinical observations, patient history, and epidemiological information.  Fact Sheet for Patients:   StrictlyIdeas.no  Fact Sheet for Healthcare Providers: BankingDealers.co.za  This test is not yet approved or  cleared by the [place] FDA and has been authorized for detection and/or diagnosis of SARS-CoV-2 by FDA under an Emergency Use Authorization (EUA).  This EUA will remain in effect (meaning this test can be used) for the duration of the COVID-19 declaration under Section 564(b)(1) of the Act, 21 U.S.C. section 360bbb-3(b)(1), unless the authorization is terminated or revoked sooner.  Performed at [facility], [place] [address], [place], [place] [address]   Lipase, blood     Status: None   Collection Time: 03/28/20 12:38 PM  Result Value Ref Range   Lipase 20 11 - 51 U/L    Comment: Performed at [facility], [place] [address] [place], [place] [address]  Comprehensive metabolic panel     Status: Abnormal   Collection Time: 03/28/20 12:38 PM  Result Value Ref Range   Sodium 138 135 - 145 mmol/L   Potassium 4.1 3.5 - 5.1 mmol/L   Chloride 106 98 - 111 mmol/L   CO2 21 (L) 22 -  32 mmol/L   Glucose, Bld 102 (H) 70 - 99 mg/dL    Comment: Glucose reference range applies only to samples taken after fasting for at least 8 hours.   BUN 12 6 - 20 mg/dL   Creatinine, Ser 0.87 0.61 - 1.24 mg/dL   Calcium 8.9 8.9 - 10.3 mg/dL   Total Protein 6.7 6.5 - 8.1 g/dL   Albumin 4.1 3.5 - 5.0 g/dL   AST 91 (H) 15 - 41 U/L   ALT 62 (H) 0 - 44 U/L   Alkaline Phosphatase 71 38 - 126 U/L   Total Bilirubin 0.9 0.3 - 1.2 mg/dL   GFR, Estimated >60 >60 mL/min    Comment: (NOTE) Calculated using the CKD-EPI Creatinine Equation (2021)    Anion gap 11 5 - 15    Comment: Performed at [facility], [place] [address], [place], [place] [address]  CBC     Status: Abnormal   Collection Time: 03/28/20 12:38 PM  Result Value Ref Range   WBC 15.4 (H) 4.0 - 10.5 K/uL   RBC 4.45 4.22 - 5.81 MIL/uL   Hemoglobin 14.5 13.0 - 17.0 g/dL   HCT 41.6 39.0 - 52.0 %   MCV 93.5 80.0 - 100.0 fL   MCH 32.6 26.0 - 34.0 pg   MCHC 34.9 30.0 - 36.0 g/dL   RDW 13.0 11.5 - 15.5 %   Platelets 268 150 - 400 K/uL   nRBC 0.0 0.0 - 0.2 %    Comment: Performed at [facility], [place] [address] [place], [place] [address]   CT Renal Stone Study  Result Date: 03/28/2020 CLINICAL DATA:  Right flank pain and right upper abdominal pain starting about midnight last night, worse with movement. EXAM: CT ABDOMEN AND PELVIS WITHOUT CONTRAST TECHNIQUE: Multidetector CT imaging of the abdomen and pelvis was  performed following the standard protocol without IV contrast. COMPARISON:  Overlapping portion of cardiac CT from 01/24/2020 FINDINGS: Lower chest: Unremarkable Hepatobiliary: Abnormal gallbladder wall thickening and pericholecystic fluid. Accentuated density in the gallbladder could be from gallstones, sludge, or debris. No CBD or common hepatic duct dilatation. Noncontrast 0.6 by 0.3 cm calcification or calcific nodule along the posterior margin of the right hepatic lobe on image 74 of series 6, significance uncertain but probably chronic. Pancreas: Unremarkable Spleen: Unremarkable Adrenals/Urinary Tract: Unremarkable Stomach/Bowel: Postoperative findings from fundoplication. Normal appendix. Otherwise unremarkable. Vascular/Lymphatic: Mild aortoiliac atherosclerotic vascular calcification. No pathologic adenopathy identified. Reproductive: Unremarkable Other: No supplemental non-categorized findings. Musculoskeletal: Lumbar spondylosis and degenerative disc disease. IMPRESSION: 1. Abnormal gallbladder wall thickening and pericholecystic fluid, suspicious for acute cholecystitis. Correlate with clinical presentation. Accentuated density in the gallbladder could be from gallstones, sludge, or debris. 2. Postoperative findings from fundoplication. 3. Lumbar spondylosis and degenerative disc disease. 4. Aortic atherosclerosis. Aortic Atherosclerosis (ICD10-I70.0). Electronically Signed   By: Van Clines M.D.   On: 03/28/2020 11:44   US Abdomen Limited RUQ (LIVER/GB)  Result Date: 03/28/2020 CLINICAL DATA:  Pain EXAM: ULTRASOUND ABDOMEN LIMITED RIGHT UPPER QUADRANT COMPARISON:  CT 03/28/2020 FINDINGS: Gallbladder: Sludge noted within the gallbladder lumen. There is mild gallbladder wall thickening with minimal pericholecystic fluid. Sonographic Murphy sign positive per technologist. Common bile duct: Diameter: 5 mm Liver: No focal lesion identified. Within normal limits in parenchymal echogenicity. Portal  vein is patent on color Doppler imaging with normal direction of blood flow towards the liver. Other: None. IMPRESSION: Sonographic findings suspicious for acute cholecystitis. Electronically Signed   By: Danie Binder.D.  On: 03/28/2020 12:37      Assessment/Plan Atrial fibrillation with RVR  - on Xarelto - LD 03/27/20@7  PM  - Flecanide - LD 03/27/20@ 7PM  - cardizem  Atrial ablation  [id number] Nissen fundoplication [id number] Dr. Johney Maine  Acute abdominal pain Cholelithiasis with cholecystitis   FEN:  IV fluids/clears ID:  Zosyn DVT:  Xarelto LD 7 PM last night   Plan:  Hold Xarelto, clear liquids, IV hydration, Zosyn for antibiotic coverage, Medicine admit, and Cardiology clearance.  Allow Xarelto to clear, then plan surgery around 03/30/20.   Recheck labs in AM.  Cardiology may want to hold off since he has just had an ablation recently secondary to stroke risk.  Will await their recommendation.      Earnstine Regal [facility] Surgery 03/28/2020, 1:35 PM Please see Amion for pager number during day hours 7:00am-4:30pm

## 2020-03-29 ENCOUNTER — Encounter (HOSPITAL_COMMUNITY): Payer: Self-pay | Admitting: Internal Medicine

## 2020-03-29 ENCOUNTER — Inpatient Hospital Stay (HOSPITAL_COMMUNITY): Payer: 59

## 2020-03-29 DIAGNOSIS — I480 Paroxysmal atrial fibrillation: Secondary | ICD-10-CM

## 2020-03-29 DIAGNOSIS — R52 Pain, unspecified: Secondary | ICD-10-CM

## 2020-03-29 DIAGNOSIS — K810 Acute cholecystitis: Secondary | ICD-10-CM

## 2020-03-29 DIAGNOSIS — Z01810 Encounter for preprocedural cardiovascular examination: Secondary | ICD-10-CM

## 2020-03-29 LAB — COMPREHENSIVE METABOLIC PANEL
ALT: 154 U/L — ABNORMAL HIGH (ref 0–44)
AST: 126 U/L — ABNORMAL HIGH (ref 15–41)
Albumin: 3.6 g/dL (ref 3.5–5.0)
Alkaline Phosphatase: 89 U/L (ref 38–126)
Anion gap: 9 (ref 5–15)
BUN: 10 mg/dL (ref 6–20)
CO2: 24 mmol/L (ref 22–32)
Calcium: 8.6 mg/dL — ABNORMAL LOW (ref 8.9–10.3)
Chloride: 103 mmol/L (ref 98–111)
Creatinine, Ser: 1.21 mg/dL (ref 0.61–1.24)
GFR, Estimated: >60 mL/min (ref >60)
Glucose, Bld: 94 mg/dL (ref 70–99)
Potassium: 4.1 mmol/L (ref 3.5–5.1)
Sodium: 136 mmol/L (ref 135–145)
Total Bilirubin: 1.3 mg/dL — ABNORMAL HIGH (ref 0.3–1.2)
Total Protein: 6.1 g/dL — ABNORMAL LOW (ref 6.5–8.1)

## 2020-03-29 LAB — CBC
HCT: 40.3 % (ref 39.0–52.0)
Hemoglobin: 13.7 g/dL (ref 13.0–17.0)
MCH: 32.7 pg (ref 26.0–34.0)
MCHC: 34 g/dL (ref 30.0–36.0)
MCV: 96.2 fL (ref 80.0–100.0)
Platelets: 256 10*3/uL (ref 150–400)
RBC: 4.19 MIL/uL — ABNORMAL LOW (ref 4.22–5.81)
RDW: 13.5 % (ref 11.5–15.5)
WBC: 14.5 10*3/uL — ABNORMAL HIGH (ref 4.0–10.5)
nRBC: 0 % (ref 0.0–0.2)

## 2020-03-29 LAB — HEPARIN LEVEL (UNFRACTIONATED)
Heparin Unfractionated: 0.65 IU/mL (ref 0.30–0.70)
Heparin Unfractionated: 0.61 IU/mL (ref 0.30–0.70)
Heparin Unfractionated: 0.74 IU/mL — ABNORMAL HIGH (ref 0.30–0.70)

## 2020-03-29 LAB — APTT
aPTT: 83 seconds — ABNORMAL HIGH (ref 24–36)
aPTT: 124 seconds — ABNORMAL HIGH (ref 24–36)

## 2020-03-29 LAB — SURGICAL PCR SCREEN
MRSA, PCR: NEGATIVE
Staphylococcus aureus: POSITIVE — AB

## 2020-03-29 MED ORDER — LACTATED RINGERS IV SOLN: INTRAVENOUS | Status: DC

## 2020-03-29 MED ORDER — LACTATED RINGERS IV BOLUS
250.0000 mL | Freq: Once | INTRAVENOUS | Status: AC
  Administered 2020-03-29: 250 mL via INTRAVENOUS

## 2020-03-29 MED ORDER — MUPIROCIN 2 % EX OINT
1.0000 "application " | TOPICAL_OINTMENT | Freq: Two times a day (BID) | CUTANEOUS | Status: DC
  Administered 2020-03-29 – 2020-04-02 (×7): 1 via NASAL
  Filled 2020-03-29 (×2): qty 22

## 2020-03-29 MED ORDER — KETOROLAC TROMETHAMINE 30 MG/ML IJ SOLN
15.0000 mg | Freq: Once | INTRAMUSCULAR | Status: AC
  Administered 2020-03-29: 15 mg via INTRAVENOUS
  Filled 2020-03-29: qty 1

## 2020-03-29 MED ORDER — PROMETHAZINE HCL 25 MG/ML IJ SOLN
25.0000 mg | Freq: Three times a day (TID) | INTRAMUSCULAR | Status: DC | PRN
  Administered 2020-03-29 – 2020-03-31 (×2): 25 mg via INTRAVENOUS
  Filled 2020-03-29 (×2): qty 1

## 2020-03-29 NOTE — ED Notes (Signed)
ED TO INPATIENT HANDOFF REPORT  Name/Age/Gender Travis Kaufman 50 y.o. male  Code Status    Code Status Orders  (From admission, onward)         Start     Ordered   03/28/20 1436  Full code  Continuous        03/28/20 1435        Code Status History    Date Active Date Inactive Code Status Order ID Comments User Context   02/01/2020 2304 02/02/2020 1537 Full Code [id number]  Clois Dupes, MD Inpatient   01/31/2020 1312 01/31/2020 2116 Full Code [id number]  Thompson Grayer, MD Inpatient   04/10/2019 0415 04/11/2019 1657 Full Code [id number]  Arps, Donald Siva, MD Inpatient   11/18/2018 1724 11/23/2018 1238 Full Code [id number]  Michael Boston, MD Inpatient   11/03/2018 1320 11/05/2018 1312 Full Code [id number]  Michael Boston, MD Inpatient   08/15/2018 1119 08/17/2018 2049 Full Code [id number]  Ahmed Prima Fransisco Hertz, PA-C Inpatient   Advance Care Planning Activity    Advance Directive Documentation   Flowsheet Row Most Recent Value  Type of Advance Directive Healthcare Power of Attorney  Pre-existing out of facility DNR order (yellow form or pink MOST form) --  "MOST" Form in Place? --      Home/SNF/Other Home  Chief Complaint Acute cholecystitis [K81.0]  Level of Care/Admitting Diagnosis ED Disposition    ED Disposition Condition [place]: [facility] [[id number]]  Level of Care: Telemetry [5]  Admit to tele based on following criteria: Complex arrhythmia (Bradycardia/Tachycardia)  May admit patient to [facility] or [facility] if equivalent level of care is available:: Yes  Covid Evaluation: Confirmed COVID Negative  Diagnosis: Acute cholecystitis [575.0.ICD-9-CM]  Admitting Physician: Harold Hedge [[id number]]  Attending Physician: Harold Hedge [[id number]]  Estimated length of stay: past midnight tomorrow  Certification:: I certify this patient will need inpatient services for at least 2 midnights       Medical History Past Medical History:   Diagnosis Date  . Coronary artery disease-no disease by cardiac cath 08/2018   . GERD (gastroesophageal reflux disease)   . PAF (paroxysmal atrial fibrillation) (HCC)     Allergies Allergies  Allergen Reactions  . Wasp Venom Anaphylaxis and Hives  . Oxycodone Other (See Comments)    "wired & can't sleep"    IV Location/Drains/Wounds Patient Lines/Drains/Airways Status    Active Line/Drains/Airways    Name Placement date Placement time Site Days   Peripheral IV 03/28/20 Left;Posterior Hand 03/28/20  1119  Hand  1   Peripheral IV 03/28/20 Anterior;Distal;Right;Upper Arm 03/28/20  1702  Arm  1   Incision (Closed) 11/03/18 Abdomen Other (Comment) 11/03/18  1033  -- 512   Incision - 6 Ports Abdomen Right;Mid Mid;Upper Mid;Medial Left;Mid;Upper Left;Mid Left;Lateral 11/03/18  0900  -- 512          Labs/Imaging Results for orders placed or performed during the hospital encounter of 03/28/20 (from the past 48 hour(s))  Urinalysis, Routine w reflex microscopic Urine, Clean Catch     Status: Abnormal   Collection Time: 03/28/20 11:50 AM  Result Value Ref Range   Color, Urine YELLOW YELLOW   APPearance CLEAR CLEAR   Specific Gravity, Urine 1.026 1.005 - 1.030   pH 6.0 5.0 - 8.0   Glucose, UA NEGATIVE NEGATIVE mg/dL   Hgb urine dipstick NEGATIVE NEGATIVE   Bilirubin Urine NEGATIVE NEGATIVE   Ketones, ur 80 (A) NEGATIVE mg/dL  Protein, ur NEGATIVE NEGATIVE mg/dL   Nitrite NEGATIVE NEGATIVE   Leukocytes,Ua NEGATIVE NEGATIVE    Comment: Performed at [facility], [address], [place], [place] [address]  SARS Coronavirus 2 by RT PCR (hospital order, performed in [facility] hospital lab) Nasopharyngeal Nasopharyngeal Swab     Status: None   Collection Time: 03/28/20 12:07 PM   Specimen: Nasopharyngeal Swab  Result Value Ref Range   SARS Coronavirus 2 NEGATIVE NEGATIVE    Comment: (NOTE) SARS-CoV-2 target nucleic acids are NOT DETECTED.  The SARS-CoV-2 RNA  is generally detectable in upper and lower respiratory specimens during the acute phase of infection. The lowest concentration of SARS-CoV-2 viral copies this assay can detect is 250 copies / mL. A negative result does not preclude SARS-CoV-2 infection and should not be used as the sole basis for treatment or other patient management decisions.  A negative result may occur with improper specimen collection / handling, submission of specimen other than nasopharyngeal swab, presence of viral mutation(s) within the areas targeted by this assay, and inadequate number of viral copies (<250 copies / mL). A negative result must be combined with clinical observations, patient history, and epidemiological information.  Fact Sheet for Patients:   BoilerBrush.com.cy  Fact Sheet for Healthcare Providers: https://pope.com/  This test is not yet approved or  cleared by the [place] FDA and has been authorized for detection and/or diagnosis of SARS-CoV-2 by FDA under an Emergency Use Authorization (EUA).  This EUA will remain in effect (meaning this test can be used) for the duration of the COVID-19 declaration under Section 564(b)(1) of the Act, 21 U.S.C. section 360bbb-3(b)(1), unless the authorization is terminated or revoked sooner.  Performed at [facility], [address] [place], [place] [address]   Lipase, blood     Status: None   Collection Time: 03/28/20 12:38 PM  Result Value Ref Range   Lipase 20 11 - 51 U/L    Comment: Performed at [facility], [address], [place], [place] [address]  Comprehensive metabolic panel     Status: Abnormal   Collection Time: 03/28/20 12:38 PM  Result Value Ref Range   Sodium 138 135 - 145 mmol/L   Potassium 4.1 3.5 - 5.1 mmol/L   Chloride 106 98 - 111 mmol/L   CO2 21 (L) 22 - 32 mmol/L   Glucose, Bld 102 (H) 70 - 99 mg/dL    Comment: Glucose reference  range applies only to samples taken after fasting for at least 8 hours.   BUN 12 6 - 20 mg/dL   Creatinine, Ser 7.74 0.61 - 1.24 mg/dL   Calcium 8.9 8.9 - 12.8 mg/dL   Total Protein 6.7 6.5 - 8.1 g/dL   Albumin 4.1 3.5 - 5.0 g/dL   AST 91 (H) 15 - 41 U/L   ALT 62 (H) 0 - 44 U/L   Alkaline Phosphatase 71 38 - 126 U/L   Total Bilirubin 0.9 0.3 - 1.2 mg/dL   GFR, Estimated >78 >67 mL/min    Comment: (NOTE) Calculated using the CKD-EPI Creatinine Equation (2021)    Anion gap 11 5 - 15    Comment: Performed at [facility], [address] [place], [place] [address]  CBC     Status: Abnormal   Collection Time: 03/28/20 12:38 PM  Result Value Ref Range   WBC 15.4 (H) 4.0 - 10.5 K/uL   RBC 4.45 4.22 - 5.81 MIL/uL  Hemoglobin 14.5 13.0 - 17.0 g/dL   HCT 41.6 39.0 - 52.0 %   MCV 93.5 80.0 - 100.0 fL   MCH 32.6 26.0 - 34.0 pg   MCHC 34.9 30.0 - 36.0 g/dL   RDW 13.0 11.5 - 15.5 %   Platelets 268 150 - 400 K/uL   nRBC 0.0 0.0 - 0.2 %    Comment: Performed at [facility], [place] [address], [place], [place] [address]  HIV Antibody (routine testing w rflx)     Status: None   Collection Time: 03/28/20  2:36 PM  Result Value Ref Range   HIV Screen 4th Generation wRfx Non Reactive Non Reactive    Comment: Performed at [facility], [place] [address] [place], [place] [address]  Heparin level (unfractionated)     Status: Abnormal   Collection Time: 03/28/20  3:53 PM  Result Value Ref Range   Heparin Unfractionated >2.20 (H) 0.30 - 0.70 IU/mL    Comment: RESULTS CONFIRMED BY MANUAL DILUTION (NOTE) If heparin results are below expected values, and patient dosage has  been confirmed, suggest follow up testing of antithrombin III levels. Performed at [facility], [place] [address], [place], [place] [address]   APTT     Status: None   Collection Time: 03/28/20  3:53 PM  Result Value Ref Range   aPTT 33 24 - 36 seconds    Comment:  Performed at [facility], [place] [address] [place] [address], [place], [place] [address]  APTT     Status: None   Collection Time: 03/28/20 10:53 PM  Result Value Ref Range   aPTT 34 24 - 36 seconds    Comment: Performed at [facility], [place] [address] [place], [place] [address]  CBC     Status: Abnormal   Collection Time: 03/29/20  5:12 AM  Result Value Ref Range   WBC 14.5 (H) 4.0 - 10.5 K/uL   RBC 4.19 (L) 4.22 - 5.81 MIL/uL   Hemoglobin 13.7 13.0 - 17.0 g/dL   HCT 40.3 39.0 - 52.0 %   MCV 96.2 80.0 - 100.0 fL   MCH 32.7 26.0 - 34.0 pg   MCHC 34.0 30.0 - 36.0 g/dL   RDW 13.5 11.5 - 15.5 %   Platelets 256 150 - 400 K/uL   nRBC 0.0 0.0 - 0.2 %    Comment: Performed at [facility], [place] [address], [place], [place] [address]  Comprehensive metabolic panel     Status: Abnormal   Collection Time: 03/29/20  5:12 AM  Result Value Ref Range   Sodium 136 135 - 145 mmol/L   Potassium 4.1 3.5 - 5.1 mmol/L   Chloride 103 98 - 111 mmol/L   CO2 24 22 - 32 mmol/L   Glucose, Bld 94 70 - 99 mg/dL    Comment: Glucose reference range applies only to samples taken after fasting for at least 8 hours.   BUN 10 6 - 20 mg/dL   Creatinine, Ser 1.21 0.61 - 1.24 mg/dL   Calcium 8.6 (L) 8.9 - 10.3 mg/dL   Total Protein 6.1 (L) 6.5 - 8.1 g/dL   Albumin 3.6 3.5 - 5.0 g/dL   AST 126 (H) 15 - 41 U/L   ALT 154 (H) 0 - 44 U/L   Alkaline Phosphatase 89 38 - 126 U/L   Total Bilirubin 1.3 (H) 0.3 - 1.2 mg/dL   GFR, Estimated >60 >60 mL/min    Comment: (NOTE)  Calculated using the CKD-EPI Creatinine Equation (2021)    Anion gap 9 5 - 15    Comment: Performed at [facility], [place] [address], [place], [place] [address]  Heparin level (unfractionated)     Status: None   Collection Time: 03/29/20  5:12 AM  Result Value Ref Range   Heparin Unfractionated 0.65 0.30 - 0.70 IU/mL    Comment: (NOTE) If heparin results are below expected values, and  patient dosage has  been confirmed, suggest follow up testing of antithrombin III levels. Performed at [facility], [place] [address], [place], [place] [address]   APTT     Status: Abnormal   Collection Time: 03/29/20  5:12 AM  Result Value Ref Range   aPTT 83 (H) 24 - 36 seconds    Comment:        IF BASELINE aPTT IS ELEVATED, SUGGEST PATIENT RISK ASSESSMENT BE USED TO DETERMINE APPROPRIATE ANTICOAGULANT THERAPY. Performed at [facility], [place] [address] [place], [place] [address]   APTT     Status: Abnormal   Collection Time: 03/29/20 12:16 PM  Result Value Ref Range   aPTT 124 (H) 24 - 36 seconds    Comment:        IF BASELINE aPTT IS ELEVATED, SUGGEST PATIENT RISK ASSESSMENT BE USED TO DETERMINE APPROPRIATE ANTICOAGULANT THERAPY. Performed at [facility], [place] [address] [place], [place] [address]   Heparin level (unfractionated)     Status: None   Collection Time: 03/29/20 12:16 PM  Result Value Ref Range   Heparin Unfractionated 0.61 0.30 - 0.70 IU/mL    Comment: (NOTE) If heparin results are below expected values, and patient dosage has  been confirmed, suggest follow up testing of antithrombin III levels. Performed at [facility], [place] [address], [place],  [address]    NM Hepatobiliary Liver Func  Result Date: 03/29/2020 CLINICAL DATA:  Evaluate for cholecystitis. EXAM: NUCLEAR MEDICINE HEPATOBILIARY IMAGING TECHNIQUE: Sequential images of the abdomen were obtained out to 60 minutes following intravenous administration of radiopharmaceutical. RADIOPHARMACEUTICALS:  4.95 mCi Tc-78m  Choletec IV COMPARISON:  Gallbladder sonogram from 03/28/2020. FINDINGS: Prompt uptake and biliary excretion of activity by the liver is seen. Biliary activity passes into small bowel, consistent with patent common bile duct. Imaging was performed for 120 minutes without visualization of the gallbladder.  IMPRESSION: 1. Nonvisualization of the gallbladder. Imaging findings are compatible with acute cholecystitis. 2. Patent common bile duct with normal biliary to bowel transit. Electronically Signed   By: Kerby Moors M.D.   On: 03/29/2020 11:26   CT Renal Stone Study  Result Date: 03/28/2020 CLINICAL DATA:  Right flank pain and right upper abdominal pain starting about midnight last night, worse with movement. EXAM: CT ABDOMEN AND PELVIS WITHOUT CONTRAST TECHNIQUE: Multidetector CT imaging of the abdomen and pelvis was performed following the standard protocol without IV contrast. COMPARISON:  Overlapping portion of cardiac CT from 01/24/2020 FINDINGS: Lower chest: Unremarkable Hepatobiliary: Abnormal gallbladder wall thickening and pericholecystic fluid. Accentuated density in the gallbladder could be from gallstones, sludge, or debris. No CBD or common hepatic duct dilatation. Noncontrast 0.6 by 0.3 cm calcification or calcific nodule along the posterior margin of the right hepatic lobe on image 74 of series 6, significance uncertain but probably chronic. Pancreas: Unremarkable Spleen: Unremarkable Adrenals/Urinary Tract: Unremarkable Stomach/Bowel: Postoperative findings from fundoplication. Normal appendix. Otherwise unremarkable. Vascular/Lymphatic: Mild aortoiliac atherosclerotic vascular calcification. No pathologic adenopathy identified. Reproductive: Unremarkable Other: No  supplemental non-categorized findings. Musculoskeletal: Lumbar spondylosis and degenerative disc disease. IMPRESSION: 1. Abnormal gallbladder wall thickening and pericholecystic fluid, suspicious for acute cholecystitis. Correlate with clinical presentation. Accentuated density in the gallbladder could be from gallstones, sludge, or debris. 2. Postoperative findings from fundoplication. 3. Lumbar spondylosis and degenerative disc disease. 4. Aortic atherosclerosis. Aortic Atherosclerosis (ICD10-I70.0). Electronically Signed   By:  Van Clines M.D.   On: 03/28/2020 11:44   US Abdomen Limited RUQ (LIVER/GB)  Result Date: 03/28/2020 CLINICAL DATA:  Pain EXAM: ULTRASOUND ABDOMEN LIMITED RIGHT UPPER QUADRANT COMPARISON:  CT 03/28/2020 FINDINGS: Gallbladder: Sludge noted within the gallbladder lumen. There is mild gallbladder wall thickening with minimal pericholecystic fluid. Sonographic Murphy sign positive per technologist. Common bile duct: Diameter: 5 mm Liver: No focal lesion identified. Within normal limits in parenchymal echogenicity. Portal vein is patent on color Doppler imaging with normal direction of blood flow towards the liver. Other: None. IMPRESSION: Sonographic findings suspicious for acute cholecystitis. Electronically Signed   By: Miachel Roux M.D.   On: 03/28/2020 12:37    Pending Labs Unresulted Labs (From admission, onward)          Start     Ordered   03/29/20 1800  Heparin level (unfractionated)  Once-Timed,   STAT        03/29/20 1346   03/29/20 0500  CBC  Daily,   R      03/28/20 1435   03/29/20 0500  Comprehensive metabolic panel  Daily,   R      03/28/20 1435   03/29/20 0500  Heparin level (unfractionated)  Daily,   R      03/28/20 1638          Vitals/Pain Today's Vitals   03/29/20 1430 03/29/20 1445 03/29/20 1500 03/29/20 1511  BP: 110/72  104/68   Pulse: (!) 59 60 61   Resp: 15 16 19    Temp:      TempSrc:      SpO2: 97% 94% 93%   PainSc:    Asleep    Isolation Precautions No active isolations  Medications Medications  acetaminophen (TYLENOL) tablet 650 mg ( Oral See Alternative 03/29/20 0655)    Or  acetaminophen (TYLENOL) suppository 650 mg (650 mg Rectal Not Given 03/29/20 0655)  sodium chloride flush (NS) 0.9 % injection 3 mL (3 mLs Intravenous Given 03/29/20 1213)  HYDROmorphone (DILAUDID) injection 0.5-1 mg (1 mg Intravenous Given 03/29/20 1409)  ondansetron (ZOFRAN) tablet 4 mg ( Oral See Alternative 03/29/20 1221)    Or  ondansetron (ZOFRAN) injection 4 mg (4  mg Intravenous Given 03/29/20 1221)  piperacillin-tazobactam (ZOSYN) IVPB 3.375 g (0 g Intravenous Stopped 03/29/20 1250)  flecainide (TAMBOCOR) tablet 75 mg (75 mg Oral Given 03/29/20 1229)  heparin ADULT infusion 100 units/mL (25000 units/242mL) (1,550 Units/hr Intravenous Infusion Verify 03/29/20 1435)  lactated ringers infusion ( Intravenous Stopped 03/29/20 0222)  lactated ringers infusion (0 mLs Intravenous Stopped 03/29/20 0927)  lactated ringers infusion ( Intravenous New Bag/Given 03/29/20 0926)  promethazine (PHENERGAN) injection 25 mg (25 mg Intravenous Given 03/29/20 1423)  sodium chloride 0.9 % bolus 1,000 mL (0 mLs Intravenous Stopped 03/28/20 1438)  ondansetron (ZOFRAN) injection 4 mg (4 mg Intravenous Given 03/28/20 1120)  morphine 4 MG/ML injection 4 mg (4 mg Intravenous Given 03/28/20 1145)  HYDROmorphone (DILAUDID) injection 1 mg (1 mg Intravenous Given 03/28/20 1332)  piperacillin-tazobactam (ZOSYN) IVPB 3.375 g (0 g Intravenous Stopped 03/28/20 1437)  acetaminophen (TYLENOL) tablet 650 mg (650 mg Oral Given 03/28/20  1354)  promethazine (PHENERGAN) 25 mg in sodium chloride 0.9 % 100 mL injection (0 mg Intravenous Stopped 03/28/20 1912)  heparin bolus via infusion 2,000 Units (2,000 Units Intravenous Bolus from Bag 03/28/20 2355)  lactated ringers bolus 250 mL (0 mLs Intravenous Stopped 03/29/20 1137)  ketorolac (TORADOL) 30 MG/ML injection 15 mg (15 mg Intravenous Given 03/29/20 0825)    Mobility walks

## 2020-03-29 NOTE — Progress Notes (Signed)
ANTICOAGULATION CONSULT NOTE - Follow Up Consult  Pharmacy Consult for Heparin Indication: atrial fibrillation s/p recent ablation (8 weeks)  Allergies  Allergen Reactions  . Wasp Venom Anaphylaxis and Hives  . Oxycodone Other (See Comments)    "wired & can't sleep"   Patient Measurements: Height: 5\' 11"  (180.3 cm) Weight: 81.5 kg (179 lb 10.8 oz) IBW/kg (Calculated) : 75.3 Heparin Dosing Weight: 81.5 kg  Vital Signs: Temp: 99.4 F (37.4 C) (01/27 1653) Temp Source: Oral (01/27 1653) BP: 108/67 (01/27 1653) Pulse Rate: 60 (01/27 1653)  Labs: Recent Labs    03/28/20 1238 03/28/20 1553 03/28/20 2253 03/29/20 0512 03/29/20 1216 03/29/20 1801  HGB 14.5  --   --  13.7  --   --   HCT 41.6  --   --  40.3  --   --   PLT 268  --   --  256  --   --   APTT  --    < > 34 83* 124*  --   HEPARINUNFRC  --    < >  --  0.65 0.61 0.74*  CREATININE 0.87  --   --  1.21  --   --    < > = values in this interval not displayed.    Estimated Creatinine Clearance: 78.7 mL/min (by C-G formula based on SCr of 1.21 mg/dL).  Medications:  Infusions:  . heparin 1,550 Units/hr (03/29/20 1435)  . lactated ringers 125 mL/hr at 03/29/20 0926  . piperacillin-tazobactam (ZOSYN)  IV Stopped (03/29/20 1250)   PTA:  Xarelto 20 mg daily with supper.  Last dose 1/25 PM  Assessment: 67 yoM admitted on 1/26 with acute cholecystitis.  PMH of atrial fibrillation and recent ablation on Xarelto. Pharmacy is consulted to bridge patient with IV heparin for Xarelto washout prior to surgery. Baseline HL > 2.2, aPTT 33.  Today, 03/29/2020:  At 1216: APTT increased to 124, supratherapeutic on same heparin 1550 units/hr  Heparin level 0.61, remains therapeutic.  Seems to be correlating with heparin dosing and APTT.  Will use Heparin levels ongoing to make dosing changes.   CBC stable, WNL  No bleeding or complications reported.  Follow up HL at 1800 was 0.74, increased on same rate, no change with d/c  Heparin tonight  Goal of Therapy:  Heparin level 0.3-0.7 units/ml aPTT 66-102 seconds Monitor platelets by anticoagulation protocol: Yes   Plan:  Discontinue heparin IV infusion at 8pm (1/27) plan surgery 1/28  Will discontinue daily heparin level for now, and CBC Follow up surgical plans: post- laparoscopic cholecystectomy 1/28  Minda Ditto PharmD 03/29/2020, 6:45 PM

## 2020-03-29 NOTE — ED Notes (Signed)
Pt has returned to room from test, report from [place] he may eat, drink and have meds as ordered.

## 2020-03-29 NOTE — Progress Notes (Addendum)
    CC: Abdominal pain  Subjective: Still complaining of significant pain in the right side.  She has not had anything for pain since early a.m. waiting on HIDA scan.  Objective: Vital signs in last 24 hours: Temp:  [97.5 F (36.4 C)-99.4 F (37.4 C)] 98.8 F (37.1 C) (01/26 1337) Pulse Rate:  [46-78] 74 (01/27 0630) Resp:  [11-21] 20 (01/27 0630) BP: (106-120)/(58-100) 115/77 (01/27 0630) SpO2:  [94 %-100 %] 94 % (01/27 0630) Weight:  [81.5 kg] 81.5 kg (01/26 0908)  No intake output recorded. Afebrile vital signs are stable CMP: Creatinine 1.21, AST 126, ALT 154, total bilirubin 1.3, WBC 14.5 HIDA scan pending. Intake/Output from previous day: 01/26 0701 - 01/27 0700 In: 2013.6 [I.V.:906.7; IV Piggyback:1106.9] Out: -  Intake/Output this shift: No intake/output data recorded.  General appearance: alert, cooperative and no distress Resp: clear to auscultation bilaterally GI: Still tender complaining of abdominal pain right side, flank and back.  No current nausea or vomiting.  Lab Results:  Recent Labs    03/28/20 1238 03/29/20 0512  WBC 15.4* 14.5*  HGB 14.5 13.7  HCT 41.6 40.3  PLT 268 256    BMET Recent Labs    03/28/20 1238 03/29/20 0512  NA 138 136  K 4.1 4.1  CL 106 103  CO2 21* 24  GLUCOSE 102* 94  BUN 12 10  CREATININE 0.87 1.21  CALCIUM 8.9 8.6*   PT/INR No results for input(s): LABPROT, INR in the last 72 hours.  Recent Labs  Lab 03/28/20 1238 03/29/20 0512  AST 91* 126*  ALT 62* 154*  ALKPHOS 71 89  BILITOT 0.9 1.3*  PROT 6.7 6.1*  ALBUMIN 4.1 3.6     Lipase     Component Value Date/Time   LIPASE 20 03/28/2020 1238     Medications: . flecainide  75 mg Oral Q12H  . sodium chloride flush  3 mL Intravenous Q12H    Assessment/Plan Atrial fibrillation with RVR  - on Xarelto - LD 03/27/20@7  PM  - Flecanide - LD 03/27/20@ 7PM  - cardizem  Atrial ablation  95/09/32 Nissen fundoplication 08/08/10 Dr. Johney Maine  Acute abdominal  pain Cholelithiasis with cholecystitis   FEN:  IV fluids/clears >> NPO ID:  Zosyn 1/26 >> day 2 DVT:  Xarelto LD 7 PM 1/25>> heparin per cardiology  Plan: We are going to get a HIDA scan today.  He is on heparin at the recommendation of cardiology.  I placed him on IV fluids, continue Zosyn, hopefully aim for laparoscopic cholecystectomy tomorrow.  LFTs still mildly elevated.  Will order labs for the a.m. also.HIDA shows non visualization of the GB consistent with acute cholecystitis.  Patient scheduled for surgery tomorrow at 8:30 AM.  We have asked pharmacy and the nursing staff to discontinue the heparin at 8 PM.       LOS: 1 day    Dolph Tavano 03/29/2020 Please see Amion

## 2020-03-29 NOTE — Progress Notes (Signed)
[place] for Heparin Infusion Indication: atrial fibrillation and recent ablation (8 weeks)  Allergies  Allergen Reactions  . Wasp Venom Anaphylaxis and Hives  . Oxycodone Other (See Comments)    "wired & can't sleep"    Patient Measurements:   Heparin Dosing Weight: 81.5 kg  Vital Signs: BP: 114/58 (01/27 0515) Pulse Rate: 62 (01/27 0515)  Labs: Recent Labs    03/28/20 1238 03/28/20 1553 03/28/20 2253 03/29/20 0512  HGB 14.5  --   --  13.7  HCT 41.6  --   --  40.3  PLT 268  --   --  256  APTT  --  33 34 83*  HEPARINUNFRC  --  >2.20*  --   --   CREATININE 0.87  --   --  1.21    Estimated Creatinine Clearance: 78.7 mL/min (by C-G formula based on SCr of 1.21 mg/dL).   Medical History: Past Medical History:  Diagnosis Date  . Coronary artery disease   . GERD (gastroesophageal reflux disease)     Medications:  Xarelto 20 mg daily with supper PTA last dose 1/25  Assessment: 50 y/o M with a h/o atrial fibrillation and recent ablation on Xarelto admitted with acute cholecystitis. Plan is to bridge patient with heparin for Xarelto washout prior to surgery. Baseline HL > 2.2, aPTT 33  03/29/2020 APTT 83 therapeutic on 1550 units/hr CBC WNL No line issues or bleeding noted  Goal of Therapy:  Heparin level 0.3-0.7 units/ml APTT 66-102  Monitor platelets by anticoagulation protocol: Yes   Plan:  continue heparin drip at 1550 units/hr check aPTT in 6 hours  HL/CBC daily Dose by aPTT until levels correlate Monitor for s/s of bleeding  Dolly Rias RPh 03/29/2020, 6:02 AM

## 2020-03-29 NOTE — Progress Notes (Addendum)
ANTICOAGULATION CONSULT NOTE - Follow Up Consult  Pharmacy Consult for Heparin Indication: atrial fibrillation s/p recent ablation (8 weeks)  Allergies  Allergen Reactions  . Wasp Venom Anaphylaxis and Hives  . Oxycodone Other (See Comments)    "wired & can't sleep"    Patient Measurements:   Heparin Dosing Weight: 81.5 kg  Vital Signs: BP: 104/62 (01/27 1136) Pulse Rate: 53 (01/27 1136)  Labs: Recent Labs    03/28/20 1238 03/28/20 1553 03/28/20 2253 03/29/20 0512  HGB 14.5  --   --  13.7  HCT 41.6  --   --  40.3  PLT 268  --   --  256  APTT  --  33 34 83*  HEPARINUNFRC  --  >2.20*  --  0.65  CREATININE 0.87  --   --  1.21    Estimated Creatinine Clearance: 78.7 mL/min (by C-G formula based on SCr of 1.21 mg/dL).   Medications:  Infusions:  . heparin 1,550 Units/hr (03/29/20 1132)  . lactated ringers Stopped (03/29/20 0927)  . lactated ringers 125 mL/hr at 03/29/20 0926  . piperacillin-tazobactam (ZOSYN)  IV 3.375 g (03/29/20 1212)   PTA:  Xarelto 20 mg daily with supper.  Last dose 1/25 PM  Assessment: 27 yoM admitted on 1/26 with acute cholecystitis.  PMH of atrial fibrillation and recent ablation on Xarelto. Pharmacy is consulted to bridge patient with IV heparin for Xarelto washout prior to surgery. Baseline HL > 2.2, aPTT 33.  Today, 03/29/2020:  APTT increased to 124, supratherapeutic on same heparin 1550 units/hr  Heparin level 0.61, remains therapeutic.  Seems to be correlating with heparin dosing and APTT.  Will use Heparin levels ongoing to make dosing changes.   CBC stable, WNL  No bleeding or complications reported.  Goal of Therapy:  Heparin level 0.3-0.7 units/ml aPTT 66-102 seconds Monitor platelets by anticoagulation protocol: Yes   Plan:  Continue heparin IV infusion at 1550 units/hr  Heparin level in 6 hours, confirmatory Daily heparin level, and CBC Follow up surgical plans:  laparoscopic cholecystectomy tomorrow per  notes.   Gretta Arab PharmD, BCPS Clinical Pharmacist [facility] main pharmacy [phone] 03/29/2020 12:44 PM   Addendum:  Per Surgery, stop heparin at 8 PM for surgery tomorrow. Follow up postop plans on 1/28  Gretta Arab PharmD, BCPS Clinical Pharmacist [facility] main pharmacy [phone] 03/29/2020 2:23 PM

## 2020-03-29 NOTE — ED Notes (Signed)
Pt in bed alert and oriented , wife at bedside

## 2020-03-29 NOTE — ED Notes (Signed)
Pt gone for test

## 2020-03-29 NOTE — Progress Notes (Signed)
PROGRESS NOTE    Patient: Travis Kaufman                            PCP: No primary care provider on file.                    DOB: 17-Feb-1971            DOA: 03/28/2020 [id number]             DOS: 03/29/2020, 10:09 AM   LOS: 1 day   Date of Service: The patient was seen and examined on 03/29/2020  Subjective:   The patient was seen and examined this morning. Hemodynamically stable at this time. Still complaining of abdominal pain some nausea but no vomiting Otherwise no issues overnight .  N.p.o. for HIDA scan this morning  Brief Narrative:   This is a 50 year old male with past medical history of atrial fibrillation s/p ablation on 01/31/2020 with Dr. Rayann Heman currently on flecainide and Xarelto, GERD s/p Nissen fundoplication who presented to the ED with sudden onset RUQ and right flank pain associated with nausea and dry heaving.  Diagnosis repeat cholecystitis  CT renal stone study-abnormal gallbladder wall thickening and pericholecystic fluid suspicious for acute cholecystitis.   RUQ [place] suspicious for acute cholecystitis.   General surgery was consulted.  Patient is receiving Zosyn, IVF  NS, PRN Dilaudid, morphine, Tylenol.  Cardiology consult for clearance agree to hold Xarelto for now. N.p.o. scheduled this AM HIDA scan 03/29/2020 Home medication of Xarelto on hold.  Cholecystectomy in a.m. 03/30/2018  Assessment & Plan:   Active Problems:   Acute cholecystitis    1.  Suspected acute cholecystitis a. No stones seen on imaging b. General surgery on board: Hold Xarelto, CLD, IV hydration, Zosyn.  Recommend cardiology clearance.  HIDA scan to confirm diagnosis plan for surgery around 1/28 c. Zofran as needed  2. Atrial fibrillation s/p ablation on 01/31/2020 with Dr. Rayann Heman a. Currently in sinus rhythm b. On flecainide and Xarelto outpatient c. Discussed with cardiology PA: continue flecainide and hold Xarelto and start heparin bridge d. Cardiology consulted for  cardiac clearance    DVT prophylaxis: Heparin   Code Status: Prior  Diet: Clear liquid diet Family Communication: Admission, patients condition and plan of care including tests being ordered have been discussed with the patient who indicates understanding and agrees with the plan and Code Status. Patient's wife was updated  Disposition Plan:       ----------------------------------------------------------------------------------------------------------------------------------------------- Nutritional status:  The patient's BMI is: There is no height or weight on file to calculate BMI. I agree with the assessment and plan as outlined below:   Skin Assessment: I have examined the patient's skin and I agree with the wound assessment as performed by wound care team As outlined belowe:   ---------------------------------------------------------------------------------------------------------------------------------------------------- Cultures; None  Antimicrobials: IV Zosyn 03/28/2020 >>    Consultants: General surgery, cardiology   ------------------------------------------------------------------------------------------------------------------------------------------------  DVT prophylaxis:  Heparin drip Code Status:   Code Status: Full Code  Family Communication: No family member present at bedside- attempt will be made to update daily The above findings and plan of care has been discussed with patient (and family)  in detail,  they expressed understanding and agreement of above. -Advance care planning has been discussed.   Admission status:   Status is: Inpatient  Remains inpatient appropriate because:Inpatient level of care appropriate due to severity of illness   Dispo: The patient  is from: Home              Anticipated d/c is to: Home              Anticipated d/c date is: 3 days              Patient currently is not medically stable to d/c.   Difficult to  place patient Yes      Level of care: Telemetry   Procedures:   HIDA scan 03/29/2020 ?  Laparoscopic cholecystectomy 03/30/2020  Antimicrobials:  Anti-infectives (From admission, onward)   Start     Dose/Rate Route Frequency Ordered Stop   03/28/20 2000  piperacillin-tazobactam (ZOSYN) IVPB 3.375 g        3.375 g 12.5 mL/hr over 240 Minutes Intravenous Every 8 hours 03/28/20 1444     03/28/20 1345  piperacillin-tazobactam (ZOSYN) IVPB 3.375 g        3.375 g 100 mL/hr over 30 Minutes Intravenous  Once 03/28/20 1331 03/28/20 1437       Medication:  . flecainide  75 mg Oral Q12H  . sodium chloride flush  3 mL Intravenous Q12H    acetaminophen **OR** acetaminophen, HYDROmorphone (DILAUDID) injection, ondansetron **OR** ondansetron (ZOFRAN) IV   Objective:   Vitals:   03/29/20 0745 03/29/20 0800 03/29/20 0815 03/29/20 0830  BP: 113/70 113/65    Pulse: (!) 59 (!) 56 (!) 52 (!) 51  Resp: 18 18 (!) 24 (!) 25  Temp:      TempSrc:      SpO2: 96% 95% 96% 98%    Intake/Output Summary (Last 24 hours) at 03/29/2020 1009 Last data filed at 03/29/2020 0222 Gross per 24 hour  Intake 2013.55 ml  Output -  Net 2013.55 ml   There were no vitals filed for this visit.   Examination:   Physical Exam  Constitution:  Alert, cooperative, no distress,  Appears calm and comfortable  Psychiatric: Normal and stable mood and affect, cognition intact,   HEENT: Normocephalic, PERRL, otherwise with in Normal limits  Chest:Chest symmetric Cardio vascular:  S1/S2, RRR, No murmure, No Rubs or Gallops  pulmonary: Clear to auscultation bilaterally, respirations unlabored, negative wheezes / crackles Abdomen: Soft, right upper quadrant tenderness, non-distended, bowel sounds,no masses, no organomegaly Muscular skeletal: Limited exam - in bed, able to move all 4 extremities, Normal strength,  Neuro: CNII-XII intact. , normal motor and sensation, reflexes intact  Extremities: No pitting edema  lower extremities, +2 pulses  Skin: Dry, warm to touch, negative for any Rashes, No open wounds Wounds: per nursing documentation    ------------------------------------------------------------------------------------------------------------------------------------------    LABs:  CBC Latest Ref Rng & Units 03/29/2020 03/28/2020 02/02/2020  WBC 4.0 - 10.5 K/uL 14.5(H) 15.4(H) 10.6(H)  Hemoglobin 13.0 - 17.0 g/dL 13.7 14.5 15.0  Hematocrit 39.0 - 52.0 % 40.3 41.6 44.3  Platelets 150 - 400 K/uL 256 268 305   CMP Latest Ref Rng & Units 03/29/2020 03/28/2020 02/02/2020  Glucose 70 - 99 mg/dL 94 102(H) 109(H)  BUN 6 - 20 mg/dL 10 12 <5(L)  Creatinine 0.61 - 1.24 mg/dL 1.21 0.87 0.87  Sodium 135 - 145 mmol/L 136 138 143  Potassium 3.5 - 5.1 mmol/L 4.1 4.1 3.9  Chloride 98 - 111 mmol/L 103 106 108  CO2 22 - 32 mmol/L 24 21(L) 25  Calcium 8.9 - 10.3 mg/dL 8.6(L) 8.9 9.1  Total Protein 6.5 - 8.1 g/dL 6.1(L) 6.7 -  Total Bilirubin 0.3 - 1.2 mg/dL 1.3(H) 0.9 -  Alkaline Phos 38 - 126 U/L 89 71 -  AST 15 - 41 U/L 126(H) 91(H) -  ALT 0 - 44 U/L 154(H) 62(H) -       Micro Results Recent Results (from the past 240 hour(s))  SARS Coronavirus 2 by RT PCR (hospital order, performed in [facility] hospital lab) Nasopharyngeal Nasopharyngeal Swab     Status: None   Collection Time: 03/28/20 12:07 PM   Specimen: Nasopharyngeal Swab  Result Value Ref Range Status   SARS Coronavirus 2 NEGATIVE NEGATIVE Final    Comment: (NOTE) SARS-CoV-2 target nucleic acids are NOT DETECTED.  The SARS-CoV-2 RNA is generally detectable in upper and lower respiratory specimens during the acute phase of infection. The lowest concentration of SARS-CoV-2 viral copies this assay can detect is 250 copies / mL. A negative result does not preclude SARS-CoV-2 infection and should not be used as the sole basis for treatment or other patient management decisions.  A negative result may occur with improper specimen  collection / handling, submission of specimen other than nasopharyngeal swab, presence of viral mutation(s) within the areas targeted by this assay, and inadequate number of viral copies (<250 copies / mL). A negative result must be combined with clinical observations, patient history, and epidemiological information.  Fact Sheet for Patients:   StrictlyIdeas.no  Fact Sheet for Healthcare Providers: BankingDealers.co.za  This test is not yet approved or  cleared by the [place] FDA and has been authorized for detection and/or diagnosis of SARS-CoV-2 by FDA under an Emergency Use Authorization (EUA).  This EUA will remain in effect (meaning this test can be used) for the duration of the COVID-19 declaration under Section 564(b)(1) of the Act, 21 U.S.C. section 360bbb-3(b)(1), unless the authorization is terminated or revoked sooner.  Performed at [facility], [place] [address] [place], [place] [address]     Radiology Reports CT Renal Stone Study  Result Date: 03/28/2020 CLINICAL DATA:  Right flank pain and right upper abdominal pain starting about midnight last night, worse with movement. EXAM: CT ABDOMEN AND PELVIS WITHOUT CONTRAST TECHNIQUE: Multidetector CT imaging of the abdomen and pelvis was performed following the standard protocol without IV contrast. COMPARISON:  Overlapping portion of cardiac CT from 01/24/2020 FINDINGS: Lower chest: Unremarkable Hepatobiliary: Abnormal gallbladder wall thickening and pericholecystic fluid. Accentuated density in the gallbladder could be from gallstones, sludge, or debris. No CBD or common hepatic duct dilatation. Noncontrast 0.6 by 0.3 cm calcification or calcific nodule along the posterior margin of the right hepatic lobe on image 74 of series 6, significance uncertain but probably chronic. Pancreas: Unremarkable Spleen: Unremarkable Adrenals/Urinary Tract: Unremarkable  Stomach/Bowel: Postoperative findings from fundoplication. Normal appendix. Otherwise unremarkable. Vascular/Lymphatic: Mild aortoiliac atherosclerotic vascular calcification. No pathologic adenopathy identified. Reproductive: Unremarkable Other: No supplemental non-categorized findings. Musculoskeletal: Lumbar spondylosis and degenerative disc disease. IMPRESSION: 1. Abnormal gallbladder wall thickening and pericholecystic fluid, suspicious for acute cholecystitis. Correlate with clinical presentation. Accentuated density in the gallbladder could be from gallstones, sludge, or debris. 2. Postoperative findings from fundoplication. 3. Lumbar spondylosis and degenerative disc disease. 4. Aortic atherosclerosis. Aortic Atherosclerosis (ICD10-I70.0). Electronically Signed   By: Van Clines M.D.   On: 03/28/2020 11:44   US Abdomen Limited RUQ (LIVER/GB)  Result Date: 03/28/2020 CLINICAL DATA:  Pain EXAM: ULTRASOUND ABDOMEN LIMITED RIGHT UPPER QUADRANT COMPARISON:  CT 03/28/2020 FINDINGS: Gallbladder: Sludge noted within the gallbladder lumen. There is mild gallbladder wall thickening with minimal pericholecystic fluid. Sonographic Murphy sign positive per technologist. Common bile duct:  Diameter: 5 mm Liver: No focal lesion identified. Within normal limits in parenchymal echogenicity. Portal vein is patent on color Doppler imaging with normal direction of blood flow towards the liver. Other: None. IMPRESSION: Sonographic findings suspicious for acute cholecystitis. Electronically Signed   By: Miachel Roux M.D.   On: 03/28/2020 12:37    SIGNED: Deatra [name], MD, FHM. [facility],  Pager (please use [email or web address] to page/text) Please use Epic Secure Chat for non-urgent communication (7AM-7PM)  If 7PM-7AM, please contact night-coverage [email or web address], 03/29/2020, 10:09 AM

## 2020-03-29 NOTE — H&P (View-Only) (Signed)
    CC: Abdominal pain  Subjective: Still complaining of significant pain in the right side.  She has not had anything for pain since early a.m. waiting on HIDA scan.  Objective: Vital signs in last 24 hours: Temp:  [97.5 F (36.4 C)-99.4 F (37.4 C)] 98.8 F (37.1 C) (01/26 1337) Pulse Rate:  [46-78] 74 (01/27 0630) Resp:  [11-21] 20 (01/27 0630) BP: (106-120)/(58-100) 115/77 (01/27 0630) SpO2:  [94 %-100 %] 94 % (01/27 0630) Weight:  [81.5 kg] 81.5 kg (01/26 0908)  No intake output recorded. Afebrile vital signs are stable CMP: Creatinine 1.21, AST 126, ALT 154, total bilirubin 1.3, WBC 14.5 HIDA scan pending. Intake/Output from previous day: 01/26 0701 - 01/27 0700 In: 2013.6 [I.V.:906.7; IV Piggyback:1106.9] Out: -  Intake/Output this shift: No intake/output data recorded.  General appearance: alert, cooperative and no distress Resp: clear to auscultation bilaterally GI: Still tender complaining of abdominal pain right side, flank and back.  No current nausea or vomiting.  Lab Results:  Recent Labs    03/28/20 1238 03/29/20 0512  WBC 15.4* 14.5*  HGB 14.5 13.7  HCT 41.6 40.3  PLT 268 256    BMET Recent Labs    03/28/20 1238 03/29/20 0512  NA 138 136  K 4.1 4.1  CL 106 103  CO2 21* 24  GLUCOSE 102* 94  BUN 12 10  CREATININE 0.87 1.21  CALCIUM 8.9 8.6*   PT/INR No results for input(s): LABPROT, INR in the last 72 hours.  Recent Labs  Lab 03/28/20 1238 03/29/20 0512  AST 91* 126*  ALT 62* 154*  ALKPHOS 71 89  BILITOT 0.9 1.3*  PROT 6.7 6.1*  ALBUMIN 4.1 3.6     Lipase     Component Value Date/Time   LIPASE 20 03/28/2020 1238     Medications: . flecainide  75 mg Oral Q12H  . sodium chloride flush  3 mL Intravenous Q12H    Assessment/Plan Atrial fibrillation with RVR  - on Xarelto - LD 03/27/20@7 PM  - Flecanide - LD 03/27/20@ 7PM  - cardizem  Atrial ablation  01/30/21 Nissen fundoplication 11/03/18 Dr. Gross  Acute abdominal  pain Cholelithiasis with cholecystitis   FEN:  IV fluids/clears >> NPO ID:  Zosyn 1/26 >> day 2 DVT:  Xarelto LD 7 PM 1/25>> heparin per cardiology  Plan: We are going to get a HIDA scan today.  He is on heparin at the recommendation of cardiology.  I placed him on IV fluids, continue Zosyn, hopefully aim for laparoscopic cholecystectomy tomorrow.  LFTs still mildly elevated.  Will order labs for the a.m. also.HIDA shows non visualization of the GB consistent with acute cholecystitis.  Patient scheduled for surgery tomorrow at 8:30 AM.  We have asked pharmacy and the nursing staff to discontinue the heparin at 8 PM.       LOS: 1 day    Travis Kaufman 03/29/2020 Please see Amion  

## 2020-03-29 NOTE — Consult Note (Addendum)
Cardiology Consultation:   Patient ID: Travis Kaufman MRN: [id number]; DOB: 04-03-1970  Admit date: 03/28/2020 Date of Consult: 03/29/2020  Primary Care Provider: No primary care provider on file. [facility] Cardiologist: Fransico Him, MD  [facility] Electrophysiologist:  Thompson Grayer, MD    Patient Profile:   Travis Kaufman is a 50 y.o. male with a hx of GERD, Nissen fundoplication, PAF and normal cors on cardiac cath 08/2018 and atrial fib ablation 01/31/20 who is being seen today for the evaluation of pre-op cardiac eval for cholelithiasis with cholecystitis at the request of Dr. Roger Shelter.  History of Present Illness:   Travis Kaufman with above hx and no coronary artery disease on cath 08/2018, PAF and a fib ablation 01/2020.  On Echo normal EF normal echo 08/2018. He is on chronic xarelto with recommendation to continue 90 days post ablation.    Pt presented to ER 03/28/20 with RUQ pain, back pain, and nausea (though could not vomit due to hx of Nissen fundoplication)  Pt evaluated and found to have cholelithiasis with cholecystitis and plans for lap chole.  He was admitted and placed on ABX.   We asked Dr. Rayann Heman to weigh in on anticoagulation and with pt being 8 weeks post op Dr. Rayann Heman was ok with pausing anticoagulation for time needed and to resume ASAP post op.   xarelto was stopped and IV heparin was started.  On last a fib visit he noted one episode of a fib lasting 3 hours. The rate was slower than it had been.     EKG:  The EKG was personally reviewed and demonstrates:  Sinus brady at 56 and no acute ST changes. Similar to EKG 03/05/20. Telemetry:  Telemetry was personally reviewed and demonstrates:  SB  BP 114/58 P 50s to 60s sp02 RA 95%.   Past Medical History:  Diagnosis Date   Coronary artery disease    GERD (gastroesophageal reflux disease)     Past Surgical History:  Procedure Laterality Date   ATRIAL FIBRILLATION ABLATION N/A 01/31/2020   Procedure:  ATRIAL FIBRILLATION ABLATION;  Surgeon: Thompson Grayer, MD;  Location: [place] [facility];  Service: Cardiovascular;  Laterality: N/A;   BIOPSY  08/17/2018   Procedure: BIOPSY;  Surgeon: Ronnette Juniper, MD;  Location: [facility];  Service: Gastroenterology;;   ESOPHAGEAL MANOMETRY N/A 09/24/2018   Procedure: ESOPHAGEAL MANOMETRY (EM);  Surgeon: Ronnette Juniper, MD;  Location: [facility];  Service: Gastroenterology;  Laterality: N/A;   ESOPHAGOGASTRODUODENOSCOPY (EGD) WITH PROPOFOL N/A 08/17/2018   Procedure: ESOPHAGOGASTRODUODENOSCOPY (EGD) WITH PROPOFOL;  Surgeon: Ronnette Juniper, MD;  Location: [place];  Service: Gastroenterology;  Laterality: N/A;   LEFT HEART CATH AND CORONARY ANGIOGRAPHY N/A 08/17/2018   Procedure: LEFT HEART CATH AND CORONARY ANGIOGRAPHY;  Surgeon: Burnell Blanks, MD;  Location: [place] [facility];  Service: Cardiovascular;  Laterality: N/A;   NISSEN FUNDOPLICATION       Home Medications:  Prior to Admission medications   Medication Sig Start Date End Date Taking? Authorizing Provider  acetaminophen (TYLENOL) 500 MG tablet Take 500-1,000 mg by mouth 2 (two) times daily as needed for moderate pain or headache.    Yes [provider]  ALPRAZolam Duanne Moron) 1 MG tablet Take 0.5-1 tablets (0.5-1 mg total) by mouth 2 (two) times daily as needed for anxiety (30-60 min prior to flying). Patient taking differently: Take 0.5-1 mg by mouth 2 (two) times daily as needed for anxiety. 09/20/19  Yes Kennyth Arnold, FNP  Aspirin-Acetaminophen-Caffeine [facility]  PO) Take 1 tablet by mouth daily as needed (headache).   Yes [provider]  B Complex Vitamins (B COMPLEX PO) Take 1 tablet by mouth daily.   Yes [provider]  Barberry-Oreg Grape-Goldenseal (BERBERINE COMPLEX PO) Take 1 capsule by mouth in the morning and at bedtime.   Yes [provider]  EPINEPHrine 0.3 mg/0.3 mL IJ SOAJ injection Inject 0.3 mLs (0.3 mg total) into the muscle as needed  for anaphylaxis. Patient taking differently: Inject 0.3 mg into the muscle once. 09/20/19  Yes Worthy Rancher B, FNP  flecainide (TAMBOCOR) 150 MG tablet Take 0.5 tablets (75 mg total) by mouth every 12 (twelve) hours. 02/02/20  Yes Sheilah Pigeon, PA-C  Nutritional Supplements (JUICE PLUS FIBRE PO) Take 8 tablets by mouth daily. Omega (2) + Berry Blend (2) + Vegetable Blend (2) + Fruit Blend (2)   Yes [provider]  OVER THE COUNTER MEDICATION Take 1 tablet by mouth 3 (three) times daily. Gastro-fiber   Yes [provider]  Probiotic Product (PROBIOTIC PO) Take 1 capsule by mouth daily. MegaSporeBiotic   Yes [provider]  rivaroxaban (XARELTO) 20 MG TABS tablet Take 1 tablet (20 mg total) by mouth daily with supper. 01/02/20  Yes [name], Fayrene Fearing, MD  diltiazem (CARDIZEM) 30 MG tablet Take 1 tablet (30 mg total) by mouth every 6 (six) hours as needed (If fast heart rate over 120 at rest). Patient not taking: No sig reported 04/11/19 04/10/20  Berton Bon, NP  pantoprazole (PROTONIX) 40 MG tablet Take 1 tablet (40 mg total) by mouth daily. Patient not taking: Reported on 03/28/2020 01/31/20 03/16/20  Hillis Range, MD    Inpatient Medications: Scheduled Meds:  flecainide  75 mg Oral Q12H   sodium chloride flush  3 mL Intravenous Q12H   Continuous Infusions:  heparin 1,550 Units/hr (03/28/20 2357)   lactated ringers     piperacillin-tazobactam (ZOSYN)  IV 3.375 g (03/29/20 0514)   PRN Meds: acetaminophen **OR** acetaminophen, HYDROmorphone (DILAUDID) injection, ondansetron **OR** ondansetron (ZOFRAN) IV  Allergies:    Allergies  Allergen Reactions   Wasp Venom Anaphylaxis and Hives   Oxycodone Other (See Comments)    "wired & can't sleep"    Social History:   Social History   Socioeconomic History   Marital status: Married    Spouse name: Not on file   Number of children: Not on file   Years of education: Not on file   Highest education level: Not on  file  Occupational History   Not on file  Tobacco Use   Smoking status: Never Smoker   Smokeless tobacco: Never Used  Vaping Use   Vaping Use: Never used  Substance and Sexual Activity   Alcohol use: Yes    Alcohol/week: 1.0 - 2.0 standard drink    Types: 1 - 2 Glasses of wine per week   Drug use: Never   Sexual activity: Not on file  Other Topics Concern   Not on file  Social History Narrative   Not on file   Social Determinants of Health   Financial Resource Strain: Not on file  Food Insecurity: Not on file  Transportation Needs: Not on file  Physical Activity: Not on file  Stress: Not on file  Social Connections: Not on file  Intimate Partner Violence: Not on file    Family History:    Family History  Problem Relation Age of Onset   GER disease Father      ROS:  Please see the history of present illness.  General:no colds or fevers, no weight changes Skin:no rashes or ulcers HEENT:no blurred vision, no congestion CV:see HPI PUL:see HPI GI:no diarrhea constipation or melena, no indigestion GU:no hematuria, no dysuria MS:no joint pain, no claudication Neuro:no syncope, no lightheadedness Endo:no diabetes, no thyroid disease  All other ROS reviewed and negative.     Physical Exam/Data:   Vitals:   03/28/20 2214 03/29/20 0221 03/29/20 0515 03/29/20 0630  BP: 119/69 120/76 (!) 114/58 115/77  Pulse: (!) 51 70 62 74  Resp: 13 17 (!) 21 20  Temp:      TempSrc:      SpO2: 98% 98% 95% 94%    Intake/Output Summary (Last 24 hours) at 03/29/2020 0750 Last data filed at 03/29/2020 0222 Gross per 24 hour  Intake 2013.55 ml  Output --  Net 2013.55 ml   Last 3 Weights 03/28/2020 03/05/2020 02/06/2020  Weight (lbs) 179 lb 9.6 oz 182 lb 9.6 oz 174 lb 12.8 oz  Weight (kg) 81.466 kg 82.827 kg 79.289 kg     There is no height or weight on file to calculate BMI.  Exam per Dr. Sallyanne Kuster  Relevant CV Studies: Echo 08/15/2018 IMPRESSIONS     1. The left ventricle  has hyperdynamic systolic function, with an  ejection fraction of >65%. The cavity size was normal. Left ventricular  diastolic parameters were normal. No evidence of left ventricular regional  wall motion abnormalities.   2. The right ventricle has normal systolic function. The cavity was  normal. There is no increase in right ventricular wall thickness. Right  ventricular systolic pressure could not be assessed.   3. The aortic valve is grossly normal.   FINDINGS   Left Ventricle: The left ventricle has hyperdynamic systolic function,  with an ejection fraction of >65%. The cavity size was normal. There is no  increase in left ventricular wall thickness. Left ventricular diastolic  parameters were normal. Normal left   ventricular filling pressures No evidence of left ventricular regional  wall motion abnormalities..   Right Ventricle: The right ventricle has normal systolic function. The  cavity was normal. There is no increase in right ventricular wall  thickness. Right ventricular systolic pressure could not be assessed.   Left Atrium: Left atrial size was normal in size.   Right Atrium: Right atrial size was normal in size. Right atrial pressure  is estimated at 10 mmHg.   Interatrial Septum: No atrial level shunt detected by color flow Doppler.   Pericardium: There is no evidence of pericardial effusion.   Mitral Valve: The mitral valve is normal in structure. Mitral valve  regurgitation is trivial by color flow Doppler.   Tricuspid Valve: The tricuspid valve is normal in structure. Tricuspid  valve regurgitation was not visualized by color flow Doppler.   Aortic Valve: The aortic valve is grossly normal Aortic valve  regurgitation was not visualized by color flow Doppler.   Pulmonic Valve: The pulmonic valve was normal in structure. Pulmonic valve  regurgitation is not visualized by color flow Doppler.   Venous: The inferior vena cava is normal in size with greater  than 50%  respiratory variability.   Cardiac cath 08/17/2018  The left ventricular systolic function is normal. LV end diastolic pressure is normal. The left ventricular ejection fraction is greater than 65% by visual estimate. There is no mitral valve regurgitation.   1. No angiographic evidence of CAD 2. Normal LV systolic function 3. Non-cardiac chest pain  No further ischemic workup  Laboratory Data:  High Sensitivity Troponin:  No results for input(s): TROPONINIHS in the last 720 hours.   Chemistry Recent Labs  Lab 03/28/20 1238 03/29/20 0512  NA 138 136  K 4.1 4.1  CL 106 103  CO2 21* 24  GLUCOSE 102* 94  BUN 12 10  CREATININE 0.87 1.21  CALCIUM 8.9 8.6*  GFRNONAA >60 >60  ANIONGAP 11 9    Recent Labs  Lab 03/28/20 1238 03/29/20 0512  PROT 6.7 6.1*  ALBUMIN 4.1 3.6  AST 91* 126*  ALT 62* 154*  ALKPHOS 71 89  BILITOT 0.9 1.3*   Hematology Recent Labs  Lab 03/28/20 1238 03/29/20 0512  WBC 15.4* 14.5*  RBC 4.45 4.19*  HGB 14.5 13.7  HCT 41.6 40.3  MCV 93.5 96.2  MCH 32.6 32.7  MCHC 34.9 34.0  RDW 13.0 13.5  PLT 268 256   BNPNo results for input(s): BNP, PROBNP in the last 168 hours.  DDimer No results for input(s): DDIMER in the last 168 hours.   Radiology/Studies:  CT Renal Stone Study  Result Date: 03/28/2020 CLINICAL DATA:  Right flank pain and right upper abdominal pain starting about midnight last night, worse with movement. EXAM: CT ABDOMEN AND PELVIS WITHOUT CONTRAST TECHNIQUE: Multidetector CT imaging of the abdomen and pelvis was performed following the standard protocol without IV contrast. COMPARISON:  Overlapping portion of cardiac CT from 01/24/2020 FINDINGS: Lower chest: Unremarkable Hepatobiliary: Abnormal gallbladder wall thickening and pericholecystic fluid. Accentuated density in the gallbladder could be from gallstones, sludge, or debris. No CBD or common hepatic duct dilatation. Noncontrast 0.6 by 0.3 cm calcification or  calcific nodule along the posterior margin of the right hepatic lobe on image 74 of series 6, significance uncertain but probably chronic. Pancreas: Unremarkable Spleen: Unremarkable Adrenals/Urinary Tract: Unremarkable Stomach/Bowel: Postoperative findings from fundoplication. Normal appendix. Otherwise unremarkable. Vascular/Lymphatic: Mild aortoiliac atherosclerotic vascular calcification. No pathologic adenopathy identified. Reproductive: Unremarkable Other: No supplemental non-categorized findings. Musculoskeletal: Lumbar spondylosis and degenerative disc disease. IMPRESSION: 1. Abnormal gallbladder wall thickening and pericholecystic fluid, suspicious for acute cholecystitis. Correlate with clinical presentation. Accentuated density in the gallbladder could be from gallstones, sludge, or debris. 2. Postoperative findings from fundoplication. 3. Lumbar spondylosis and degenerative disc disease. 4. Aortic atherosclerosis. Aortic Atherosclerosis (ICD10-I70.0). Electronically Signed   By: Van Clines M.D.   On: 03/28/2020 11:44   US Abdomen Limited RUQ (LIVER/GB)  Result Date: 03/28/2020 CLINICAL DATA:  Pain EXAM: ULTRASOUND ABDOMEN LIMITED RIGHT UPPER QUADRANT COMPARISON:  CT 03/28/2020 FINDINGS: Gallbladder: Sludge noted within the gallbladder lumen. There is mild gallbladder wall thickening with minimal pericholecystic fluid. Sonographic Murphy sign positive per technologist. Common bile duct: Diameter: 5 mm Liver: No focal lesion identified. Within normal limits in parenchymal echogenicity. Portal vein is patent on color Doppler imaging with normal direction of blood flow towards the liver. Other: None. IMPRESSION: Sonographic findings suspicious for acute cholecystitis. Electronically Signed   By: Miachel Roux M.D.   On: 03/28/2020 12:37     Assessment and Plan:   Pre-op eval. For lap chole, pt with no CAD by cardiac cath 2020 and no chest pain.  He has normal EF and normal Echo.  His PAF was  treated with a fib ablation 01/31/20 and is 8 weeks post procedure.  While we prefer to keep on anticoagulation for 90 days - EP with Dr. Rayann Heman feels short episode without is safe.  Currently on IV heparin until surgery. Would like pt back on xarelto  as soon as safe post op.  Class II risk of major cardiac event 0.9% due to surgery.  PAF has occ break through episodes since ablation. Monitor Acute cholecystitis. Plan for surgery. Hx nissen fundoplication in 0981     Risk Assessment/Risk Scores:          CHA2DS2-VASc Score = 0  This indicates a 0.2% annual risk of stroke. The patient's score is based upon: CHF History: No HTN History: No Diabetes History: No Stroke History: No Vascular Disease History: No          For questions or updates, please contact [facility] Please consult [email or web address] for contact info under    Signed, Cecilie Kicks, NP  03/29/2020 7:50 AM  I have seen and examined the patient along with Cecilie Kicks, NP .  I have reviewed the chart, notes and new data.  I agree with PA/NP's note.  Key new complaints: still w 8/10 RUQ pain Key examination changes: very tender RUQ, normal  CV exam Key new findings / data: no arrhythmia on telemetry, abnormal HIDA scan  PLAN: By tomorrow AM, all anticoagulant effect of Xarelto should be gone. Low risk for major CV complications w cholecystectomy. Resume IV heparin without bolus as soon as felt safe by Surgery, then transition back to xarelto if no bleeding issues. As much as possible, continue flecainide and diltiazem without interruption.  Sanda Klein, MD, [place] [phone] 03/29/2020, 1:54 PM

## 2020-03-30 ENCOUNTER — Encounter (HOSPITAL_COMMUNITY)
Admission: EM | Disposition: A | Discharge status: Home / Self Care | Payer: Self-pay | Source: Home / Self Care | Attending: Internal Medicine

## 2020-03-30 ENCOUNTER — Inpatient Hospital Stay (HOSPITAL_COMMUNITY): Payer: 59

## 2020-03-30 ENCOUNTER — Inpatient Hospital Stay (HOSPITAL_COMMUNITY): Payer: 59 | Admitting: Anesthesiology

## 2020-03-30 ENCOUNTER — Other Ambulatory Visit: Payer: Self-pay

## 2020-03-30 ENCOUNTER — Encounter (HOSPITAL_COMMUNITY): Payer: Self-pay | Admitting: Internal Medicine

## 2020-03-30 DIAGNOSIS — R509 Fever, unspecified: Secondary | ICD-10-CM

## 2020-03-30 HISTORY — PX: CHOLECYSTECTOMY: SHX55

## 2020-03-30 LAB — COMPREHENSIVE METABOLIC PANEL
ALT: 164 U/L — ABNORMAL HIGH (ref 0–44)
AST: 115 U/L — ABNORMAL HIGH (ref 15–41)
Albumin: 3.3 g/dL — ABNORMAL LOW (ref 3.5–5.0)
Alkaline Phosphatase: 166 U/L — ABNORMAL HIGH (ref 38–126)
Anion gap: 11 (ref 5–15)
BUN: 8 mg/dL (ref 6–20)
CO2: 23 mmol/L (ref 22–32)
Calcium: 8.4 mg/dL — ABNORMAL LOW (ref 8.9–10.3)
Chloride: 103 mmol/L (ref 98–111)
Creatinine, Ser: 1.15 mg/dL (ref 0.61–1.24)
GFR, Estimated: >60 mL/min (ref >60)
Glucose, Bld: 102 mg/dL — ABNORMAL HIGH (ref 70–99)
Potassium: 3.3 mmol/L — ABNORMAL LOW (ref 3.5–5.1)
Sodium: 137 mmol/L (ref 135–145)
Total Bilirubin: 1.1 mg/dL (ref 0.3–1.2)
Total Protein: 6.1 g/dL — ABNORMAL LOW (ref 6.5–8.1)

## 2020-03-30 LAB — CBC
HCT: 38.9 % — ABNORMAL LOW (ref 39.0–52.0)
Hemoglobin: 13 g/dL (ref 13.0–17.0)
MCH: 32.3 pg (ref 26.0–34.0)
MCHC: 33.4 g/dL (ref 30.0–36.0)
MCV: 96.8 fL (ref 80.0–100.0)
Platelets: 235 10*3/uL (ref 150–400)
RBC: 4.02 MIL/uL — ABNORMAL LOW (ref 4.22–5.81)
RDW: 13.8 % (ref 11.5–15.5)
WBC: 13.4 10*3/uL — ABNORMAL HIGH (ref 4.0–10.5)
nRBC: 0 % (ref 0.0–0.2)

## 2020-03-30 SURGERY — LAPAROSCOPIC CHOLECYSTECTOMY WITH INTRAOPERATIVE CHOLANGIOGRAM
Anesthesia: General | Site: Abdomen

## 2020-03-30 MED ORDER — IOHEXOL 300 MG/ML  SOLN
INTRAMUSCULAR | Status: DC | PRN
  Administered 2020-03-30: 10 mL

## 2020-03-30 MED ORDER — GABAPENTIN 300 MG PO CAPS
300.0000 mg | ORAL_CAPSULE | ORAL | Status: AC
Start: 2020-03-30 — End: 2020-03-30
  Administered 2020-03-30: 300 mg via ORAL
  Filled 2020-03-30: qty 1

## 2020-03-30 MED ORDER — PROPOFOL 10 MG/ML IV BOLUS
INTRAVENOUS | Status: DC | PRN
  Administered 2020-03-30: 200 mg via INTRAVENOUS

## 2020-03-30 MED ORDER — FENTANYL CITRATE (PF) 100 MCG/2ML IJ SOLN
INTRAMUSCULAR | Status: DC | PRN
  Administered 2020-03-30 (×2): 50 ug via INTRAVENOUS
  Administered 2020-03-30: 100 ug via INTRAVENOUS
  Administered 2020-03-30: 50 ug via INTRAVENOUS

## 2020-03-30 MED ORDER — MIDAZOLAM HCL 5 MG/5ML IJ SOLN
INTRAMUSCULAR | Status: DC | PRN
  Administered 2020-03-30: 2 mg via INTRAVENOUS

## 2020-03-30 MED ORDER — BUPIVACAINE-EPINEPHRINE (PF) 0.5% -1:200000 IJ SOLN
INTRAMUSCULAR | Status: AC
  Filled 2020-03-30: qty 30

## 2020-03-30 MED ORDER — LIDOCAINE 5 % EX PTCH
1.0000 | MEDICATED_PATCH | CUTANEOUS | Status: DC
  Administered 2020-04-01: 1 via TRANSDERMAL
  Filled 2020-03-30 (×4): qty 1

## 2020-03-30 MED ORDER — LACTATED RINGERS IR SOLN
Status: DC | PRN
  Administered 2020-03-30: 1000 mL

## 2020-03-30 MED ORDER — ACETAMINOPHEN 325 MG PO TABS
650.0000 mg | ORAL_TABLET | Freq: Four times a day (QID) | ORAL | Status: DC
  Administered 2020-03-30 – 2020-04-02 (×10): 650 mg via ORAL
  Filled 2020-03-30 (×11): qty 2

## 2020-03-30 MED ORDER — TRAMADOL HCL 50 MG PO TABS
50.0000 mg | ORAL_TABLET | Freq: Four times a day (QID) | ORAL | Status: DC | PRN
  Administered 2020-03-30: 100 mg via ORAL
  Administered 2020-04-01: 50 mg via ORAL
  Filled 2020-03-30: qty 2
  Filled 2020-03-30: qty 1

## 2020-03-30 MED ORDER — MIDAZOLAM HCL 2 MG/2ML IJ SOLN
INTRAMUSCULAR | Status: AC
  Filled 2020-03-30: qty 2

## 2020-03-30 MED ORDER — MORPHINE SULFATE (PF) 2 MG/ML IV SOLN: 2.0000 mg | INTRAVENOUS | Status: DC | PRN

## 2020-03-30 MED ORDER — SCOPOLAMINE 1 MG/3DAYS TD PT72
1.0000 | MEDICATED_PATCH | TRANSDERMAL | Status: DC
  Administered 2020-03-30: 1.5 mg via TRANSDERMAL
  Filled 2020-03-30 (×2): qty 1

## 2020-03-30 MED ORDER — FENTANYL CITRATE (PF) 250 MCG/5ML IJ SOLN
INTRAMUSCULAR | Status: AC
  Filled 2020-03-30: qty 5

## 2020-03-30 MED ORDER — LACTATED RINGERS IV SOLN: INTRAVENOUS | Status: DC

## 2020-03-30 MED ORDER — FENTANYL CITRATE (PF) 100 MCG/2ML IJ SOLN: 25.0000 ug | INTRAMUSCULAR | Status: DC | PRN

## 2020-03-30 MED ORDER — ACETAMINOPHEN 500 MG PO TABS
1000.0000 mg | ORAL_TABLET | ORAL | Status: AC
Start: 2020-03-30 — End: 2020-03-30
  Administered 2020-03-30: 1000 mg via ORAL
  Filled 2020-03-30: qty 2

## 2020-03-30 MED ORDER — ROCURONIUM BROMIDE 10 MG/ML (PF) SYRINGE
PREFILLED_SYRINGE | INTRAVENOUS | Status: DC | PRN
  Administered 2020-03-30: 60 mg via INTRAVENOUS
  Administered 2020-03-30 (×2): 10 mg via INTRAVENOUS

## 2020-03-30 MED ORDER — LIDOCAINE 2% (20 MG/ML) 5 ML SYRINGE
INTRAMUSCULAR | Status: DC | PRN
  Administered 2020-03-30: 60 mg via INTRAVENOUS

## 2020-03-30 MED ORDER — 0.9 % SODIUM CHLORIDE (POUR BTL) OPTIME
TOPICAL | Status: DC | PRN
  Administered 2020-03-30: 1000 mL

## 2020-03-30 MED ORDER — ROCURONIUM BROMIDE 10 MG/ML (PF) SYRINGE
PREFILLED_SYRINGE | INTRAVENOUS | Status: AC
  Filled 2020-03-30: qty 10

## 2020-03-30 MED ORDER — KETAMINE HCL 10 MG/ML IJ SOLN
INTRAMUSCULAR | Status: DC | PRN
  Administered 2020-03-30: 40 mg via INTRAVENOUS

## 2020-03-30 MED ORDER — DEXAMETHASONE SODIUM PHOSPHATE 10 MG/ML IJ SOLN
INTRAMUSCULAR | Status: AC
  Filled 2020-03-30: qty 1

## 2020-03-30 MED ORDER — KETOROLAC TROMETHAMINE 30 MG/ML IJ SOLN: 30.0000 mg | Freq: Once | INTRAMUSCULAR | Status: DC | PRN

## 2020-03-30 MED ORDER — DEXAMETHASONE SODIUM PHOSPHATE 10 MG/ML IJ SOLN
INTRAMUSCULAR | Status: DC | PRN
  Administered 2020-03-30: 10 mg via INTRAVENOUS

## 2020-03-30 MED ORDER — ONDANSETRON HCL 4 MG/2ML IJ SOLN
INTRAMUSCULAR | Status: DC | PRN
  Administered 2020-03-30: 4 mg via INTRAVENOUS

## 2020-03-30 MED ORDER — HEPARIN (PORCINE) 25000 UT/250ML-% IV SOLN
1500.0000 [IU]/h | INTRAVENOUS | Status: AC
  Administered 2020-03-31: 1500 [IU]/h via INTRAVENOUS
  Filled 2020-03-30: qty 250

## 2020-03-30 MED ORDER — SUGAMMADEX SODIUM 200 MG/2ML IV SOLN
INTRAVENOUS | Status: DC | PRN
  Administered 2020-03-30: 175 mg via INTRAVENOUS

## 2020-03-30 MED ORDER — PROPOFOL 10 MG/ML IV BOLUS
INTRAVENOUS | Status: AC
  Filled 2020-03-30: qty 20

## 2020-03-30 MED ORDER — BUPIVACAINE-EPINEPHRINE 0.5% -1:200000 IJ SOLN
INTRAMUSCULAR | Status: DC | PRN
  Administered 2020-03-30: 30 mL

## 2020-03-30 MED ORDER — PROMETHAZINE HCL 25 MG/ML IJ SOLN: 6.2500 mg | INTRAMUSCULAR | Status: DC | PRN

## 2020-03-30 MED ORDER — ONDANSETRON HCL 4 MG/2ML IJ SOLN
INTRAMUSCULAR | Status: AC
  Filled 2020-03-30: qty 2

## 2020-03-30 MED ORDER — LIDOCAINE 20MG/ML (2%) 15 ML SYRINGE OPTIME
INTRAMUSCULAR | Status: DC | PRN
  Administered 2020-03-30: 1.5 mg/kg/h via INTRAVENOUS

## 2020-03-30 MED ORDER — METHOCARBAMOL 500 MG PO TABS: 750.0000 mg | ORAL_TABLET | Freq: Four times a day (QID) | ORAL | Status: DC | PRN

## 2020-03-30 MED ORDER — MORPHINE SULFATE (PF) 2 MG/ML IV SOLN
2.0000 mg | Freq: Once | INTRAVENOUS | Status: AC
  Administered 2020-03-30: 2 mg via INTRAVENOUS
  Filled 2020-03-30: qty 1

## 2020-03-30 SURGICAL SUPPLY — 36 items
ADH SKN CLS APL DERMABOND .7 (GAUZE/BANDAGES/DRESSINGS) ×1
APL PRP STRL LF DISP 70% ISPRP (MISCELLANEOUS) ×2
APPLIER CLIP ROT 10 11.4 M/L (STAPLE) ×2
APR CLP MED LRG 11.4X10 (STAPLE) ×1
BAG SPEC RTRVL LRG 6X4 10 (ENDOMECHANICALS) ×1
CABLE HIGH FREQUENCY MONO STRZ (ELECTRODE) ×2 IMPLANT
CHLORAPREP W/TINT 26 (MISCELLANEOUS) ×4 IMPLANT
CLIP APPLIE ROT 10 11.4 M/L (STAPLE) ×1 IMPLANT
COVER MAYO STAND STRL (DRAPES) ×2 IMPLANT
COVER SURGICAL LIGHT HANDLE (MISCELLANEOUS) ×2 IMPLANT
COVER WAND RF STERILE (DRAPES) IMPLANT
DECANTER SPIKE VIAL GLASS SM (MISCELLANEOUS) ×2 IMPLANT
DERMABOND ADVANCED (GAUZE/BANDAGES/DRESSINGS) ×1
DERMABOND ADVANCED .7 DNX12 (GAUZE/BANDAGES/DRESSINGS) IMPLANT
DRAPE C-ARM 42X120 X-RAY (DRAPES) ×2 IMPLANT
ELECT REM PT RETURN 15FT ADLT (MISCELLANEOUS) ×2 IMPLANT
GLOVE SURG ORTHO LTX SZ8 (GLOVE) ×2 IMPLANT
GOWN STRL REUS W/TWL XL LVL3 (GOWN DISPOSABLE) ×4 IMPLANT
HEMOSTAT SURGICEL 4X8 (HEMOSTASIS) IMPLANT
KIT BASIN OR (CUSTOM PROCEDURE TRAY) ×2 IMPLANT
KIT TURNOVER KIT A (KITS) ×1 IMPLANT
PENCIL SMOKE EVACUATOR (MISCELLANEOUS) IMPLANT
POUCH SPECIMEN RETRIEVAL 10MM (ENDOMECHANICALS) ×2 IMPLANT
SCISSORS LAP 5X35 DISP (ENDOMECHANICALS) ×2 IMPLANT
SET CHOLANGIOGRAPH MIX (MISCELLANEOUS) ×2 IMPLANT
SET IRRIG TUBING LAPAROSCOPIC (IRRIGATION / IRRIGATOR) ×2 IMPLANT
SET TUBE SMOKE EVAC HIGH FLOW (TUBING) IMPLANT
SLEEVE XCEL OPT CAN 5 100 (ENDOMECHANICALS) ×2 IMPLANT
STRIP CLOSURE SKIN 1/2X4 (GAUZE/BANDAGES/DRESSINGS) IMPLANT
SUT MNCRL AB 4-0 PS2 18 (SUTURE) ×2 IMPLANT
TOWEL OR 17X26 10 PK STRL BLUE (TOWEL DISPOSABLE) ×2 IMPLANT
TOWEL OR NON WOVEN STRL DISP B (DISPOSABLE) ×2 IMPLANT
TRAY LAPAROSCOPIC (CUSTOM PROCEDURE TRAY) ×2 IMPLANT
TROCAR BLADELESS OPT 5 100 (ENDOMECHANICALS) ×2 IMPLANT
TROCAR XCEL BLUNT TIP 100MML (ENDOMECHANICALS) ×2 IMPLANT
TROCAR XCEL NON-BLD 11X100MML (ENDOMECHANICALS) ×2 IMPLANT

## 2020-03-30 NOTE — Anesthesia Preprocedure Evaluation (Addendum)
Anesthesia Evaluation  Patient identified by MRN, date of birth, ID band Patient awake    Reviewed: Allergy & Precautions, NPO status , Patient's Chart, lab work & pertinent test results  Airway Mallampati: I  TM Distance: >3 FB Neck ROM: Full    Dental no notable dental hx.    Pulmonary neg pulmonary ROS,    Pulmonary exam normal breath sounds clear to auscultation       Cardiovascular + CAD  Normal cardiovascular exam+ dysrhythmias Atrial Fibrillation  Rhythm:Regular Rate:Normal     Neuro/Psych negative neurological ROS  negative psych ROS   GI/Hepatic Neg liver ROS, GERD  Medicated and Controlled,  Endo/Other  negative endocrine ROS  Renal/GU negative Renal ROS     Musculoskeletal negative musculoskeletal ROS (+)   Abdominal   Peds  Hematology negative hematology ROS (+)   Anesthesia Other Findings CHOLELITHIASIS WITH CHOLECYSTITIS  Reproductive/Obstetrics                            Anesthesia Physical Anesthesia Plan  ASA: II  Anesthesia Plan: General   Post-op Pain Management:    Induction: Intravenous  PONV Risk Score and Plan: 3 and Ondansetron, Dexamethasone, Midazolam and Treatment may vary due to age or medical condition  Airway Management Planned: Oral ETT  Additional Equipment:   Intra-op Plan:   Post-operative Plan: Extubation in OR  Informed Consent: I have reviewed the patients History and Physical, chart, labs and discussed the procedure including the risks, benefits and alternatives for the proposed anesthesia with the patient or authorized representative who has indicated his/her understanding and acceptance.     Dental advisory given  Plan Discussed with: CRNA  Anesthesia Plan Comments:         Anesthesia Quick Evaluation

## 2020-03-30 NOTE — Transfer of Care (Signed)
Immediate Anesthesia Transfer of Care Note  Patient: Travis Kaufman  Procedure(s) Performed: LAPAROSCOPIC CHOLECYSTECTOMY WITH INTRAOPERATIVE CHOLANGIOGRAM (N/A Abdomen)  Patient Location: PACU  Anesthesia Type:General  Level of Consciousness: awake, alert  and oriented  Airway & Oxygen Therapy: Patient Spontanous Breathing and Patient connected to face mask oxygen  Post-op Assessment: Report given to RN and Post -op Vital signs reviewed and stable  Post vital signs: Reviewed and stable  Last Vitals:  Vitals Value Taken Time  BP 118/83 03/30/20 1231  Temp    Pulse 82 03/30/20 1232  Resp 13 03/30/20 1232  SpO2 98 % 03/30/20 1232  Vitals shown include unvalidated device data.  Last Pain:  Vitals:   03/30/20 0840  TempSrc:   PainSc: 8       Patients Stated Pain Goal: 2 (97/58/83 2549)  Complications: No complications documented.

## 2020-03-30 NOTE — Progress Notes (Signed)
   03/29/20 2024  Assess: MEWS Score  Temp (!) 102.4 F (39.1 C)  BP 118/63  Pulse Rate 67  Resp 18  SpO2 93 %  Assess: MEWS Score  MEWS Temp 2  MEWS Systolic 0  MEWS Pulse 0  MEWS RR 0  MEWS LOC 0  MEWS Score 2  MEWS Score Color Yellow  Assess: if the MEWS score is Yellow or Red  Were vital signs taken at a resting state? Yes  Focused Assessment No change from prior assessment  Early Detection of Sepsis Score *See Row Information* Low  MEWS guidelines implemented *See Row Information* Yes  Treat  MEWS Interventions Escalated (See documentation below);Administered prn meds/treatments  Take Vital Signs  Increase Vital Sign Frequency  Yellow: Q 2hr X 2 then Q 4hr X 2, if remains yellow, continue Q 4hrs  Escalate  MEWS: Escalate Yellow: discuss with charge nurse/RN and consider discussing with provider and RRT  Notify: Charge Nurse/RN  Name of Charge Nurse/RN Notified Ify  Date Charge Nurse/RN Notified 03/29/20  Time Charge Nurse/RN Notified 2035

## 2020-03-30 NOTE — Discharge Instructions (Signed)
[facility] LAPAROSCOPIC SURGERY: POST OP INSTRUCTIONS Always review your discharge instruction sheet given to you by the facility where your surgery was performed. IF YOU HAVE DISABILITY OR FAMILY LEAVE FORMS, YOU MUST BRING THEM TO THE OFFICE FOR PROCESSING.   DO NOT GIVE THEM TO YOUR DOCTOR.  PAIN CONTROL  1. First take acetaminophen (Tylenol) AND/or ibuprofen (Advil) to control your pain after surgery.  Follow directions on package.  Taking acetaminophen (Tylenol) and/or ibuprofen (Advil) regularly after surgery will help to control your pain and lower the amount of prescription pain medication you may need.  You should not take more than 3,000 mg (3 grams) of acetaminophen (Tylenol) in 24 hours.  You should not take ibuprofen (Advil), aleve, motrin, naprosyn or other NSAIDS if you have a history of stomach ulcers or chronic kidney disease.  2. A prescription for pain medication may be given to you upon discharge.  Take your pain medication as prescribed, if you still have uncontrolled pain after taking acetaminophen (Tylenol) or ibuprofen (Advil). 3. Use ice packs to help control pain. 4. If you need a refill on your pain medication, please contact your pharmacy.  They will contact our office to request authorization. Prescriptions will not be filled after 5pm or on week-ends.  HOME MEDICATIONS 5. Take your usually prescribed medications unless otherwise directed.  DIET 6. You should follow a light diet the first few days after arrival home.  Be sure to include lots of fluids daily. Avoid fatty, fried foods.   CONSTIPATION 7. It is common to experience some constipation after surgery and if you are taking pain medication.  Increasing fluid intake and taking a stool softener (such as Colace) will usually help or prevent this problem from occurring.  A mild laxative (Milk of Magnesia or Miralax) should be taken according to package instructions if there are no bowel  movements after 48 hours.  WOUND/INCISION CARE 8. Most patients will experience some swelling and bruising in the area of the incisions.  Ice packs will help.  Swelling and bruising can take several days to resolve.  9. Unless discharge instructions indicate otherwise, follow guidelines below  a. STERI-STRIPS - you may remove your outer bandages 48 hours after surgery, and you may shower at that time.  You have steri-strips (small skin tapes) in place directly over the incision.  These strips should be left on the skin for 7-10 days.   b. DERMABOND/SKIN GLUE - you may shower in 24 hours.  The glue will flake off over the next 2-3 weeks. 10. Any sutures or staples will be removed at the office during your follow-up visit.  ACTIVITIES 11. You may resume regular (light) daily activities beginning the next day--such as daily self-care, walking, climbing stairs--gradually increasing activities as tolerated.  You may have sexual intercourse when it is comfortable.  Refrain from any heavy lifting or straining until approved by your doctor. a. You may drive when you are no longer taking prescription pain medication, you can comfortably wear a seatbelt, and you can safely maneuver your car and apply brakes.  FOLLOW-UP 12. You should see your doctor in the office for a follow-up appointment approximately 2-3 weeks after your surgery.  You should have been given your post-op/follow-up appointment when your surgery was scheduled.  If you did not receive a post-op/follow-up appointment, make sure that you call for this appointment within a day or two after you arrive home to insure a convenient appointment time.  WHEN   TO CALL YOUR DOCTOR: 1. Fever over 101.0 2. Inability to urinate 3. Continued bleeding from incision. 4. Increased pain, redness, or drainage from the incision. 5. Increasing abdominal pain  The clinic staff is available to answer your questions during regular business hours.  Please don't  hesitate to call and ask to speak to one of the nurses for clinical concerns.  If you have a medical emergency, go to the nearest emergency room or call 911.  A surgeon from [facility] is always on call at the hospital. [address], [address], [place], [place]  [address] ? [address] [address], [place] , [place]   [address] [phone] ? [phone] ? FAX [phone] Web site: [email or web address]  

## 2020-03-30 NOTE — Op Note (Signed)
Procedure Note  Pre-operative Diagnosis:  Acute cholecystitis  Post-operative Diagnosis:  Acute cholecystitis, cholelithiasis  Surgeon:  Armandina Gemma, MD  Assistant:  none   Procedure:  Laparoscopic cholecystectomy with intra-operative cholangiography  Anesthesia:  General  Estimated Blood Loss:  minimal  Drains: none         Specimen: gallbladder to pathology  Indications:  Patient is a 50 yo male admitted with acute cholecystitis and mildly elevated LFT's.  HIDA scan was positive for acute cholecystitis.  Anticoagulation was held and patient was evaluated by cardiology.  Patient now comes to surgery for cholecystectomy.  Procedure Details:  The patient was seen in the pre-op holding area. The risks, benefits, complications, treatment options, and expected outcomes were previously discussed with the patient. The patient agreed with the proposed plan and has signed the informed consent form.  The patient was transported to operating room #1 at the [facility]. The patient was placed in the supine position on the operating room table. Following induction of general anesthesia, the abdomen was prepped and draped in the usual aseptic fashion.  An incision was made in the skin near the umbilicus. The midline fascia was incised and the peritoneal cavity was entered and a Hasson cannula was introduced under direct vision. The cannula was secured with a 0-Vicryl pursestring suture. Pneumoperitoneum was established with carbon dioxide. Additional cannulae were introduced under direct vision along the right costal margin in the midline, mid-clavicular line, and anterior axillary line.   The gallbladder was identified.  It was markedly inflamed and thickened.  Omental adhesions were taken down and hemostasis obtained with the cautery. The gallbladder was aspirated and the fundus grasped and retracted cephalad. Adhesions were taken down bluntly and the electrocautery was utilized as needed,  taking care not to involve any adjacent structures. The infundibulum was grasped and retracted laterally, exposing the peritoneum overlying the triangle of Calot. The peritoneum was incised and structures exposed with blunt dissection. The cystic duct was clearly identified, bluntly dissected circumferentially, and clipped at the neck of the gallbladder.  An incision was made in the cystic duct and the cholangiogram catheter introduced. The catheter was secured using an ligaclip.  Real-time cholangiography was performed using C-arm fluoroscopy.  There was rapid filling of a normal caliber common bile duct.  With repositioning of the catheter, there was reflux of contrast into the left and right hepatic ductal systems.  There was free flow distally into the duodenum without filling defect or obstruction.  The catheter was removed from the peritoneal cavity.  The cystic duct was then ligated with ligaclips and divided. The cystic artery was identified, dissected circumferentially, ligated with ligaclips, and divided.  The gallbladder was dissected away from the gallbladder bed using the electrocautery for hemostasis. The gallbladder was completely removed from the liver and placed into an endocatch bag. The gallbladder was removed in the endocatch bag through the umbilical port site and submitted to pathology for review.  The right upper quadrant was irrigated and the gallbladder bed was inspected. Hemostasis was achieved with the electrocautery.  Cannulae were removed under direct vision and good hemostasis was noted. Pneumoperitoneum was released and the majority of the carbon dioxide evacuated. The umbilical wound was irrigated and the fascia was then closed with the pursestring suture.  Local anesthetic was infiltrated at all port sites. Skin incisions were closed with 4-0 Monocril subcuticular sutures and Dermabond was applied.  Instrument, sponge, and needle counts were correct at the conclusion of  the  case.  The patient was awakened from anesthesia and brought to the recovery room in stable condition.  The patient tolerated the procedure well.   Armandina Gemma, MD [facility] Office: [phone]

## 2020-03-30 NOTE — Anesthesia Postprocedure Evaluation (Signed)
Anesthesia Post Note  Patient: Travis Kaufman  Procedure(s) Performed: LAPAROSCOPIC CHOLECYSTECTOMY WITH INTRAOPERATIVE CHOLANGIOGRAM (N/A Abdomen)     Patient location during evaluation: PACU Anesthesia Type: General Level of consciousness: awake and alert and oriented Pain management: pain level controlled Vital Signs Assessment: post-procedure vital signs reviewed and stable Respiratory status: spontaneous breathing, nonlabored ventilation and respiratory function stable Cardiovascular status: blood pressure returned to baseline Postop Assessment: no apparent nausea or vomiting Anesthetic complications: no   No complications documented.  Last Vitals:  Vitals:   03/30/20 0838 03/30/20 1231  BP: 112/67 118/83  Pulse: (!) 54 81  Resp: 16 13  Temp: 37.2 C 37.1 C  SpO2: 92% 97%    Last Pain:  Vitals:   03/30/20 1231  TempSrc:   PainSc: 0-No pain                 Brennan Bailey

## 2020-03-30 NOTE — Progress Notes (Signed)
ANTICOAGULATION CONSULT NOTE - Follow Up Consult  Pharmacy Consult for Heparin Indication: atrial fibrillation s/p recent ablation (8 weeks)  Allergies  Allergen Reactions  . Wasp Venom Anaphylaxis and Hives  . Oxycodone Other (See Comments)    "wired & can't sleep"   Patient Measurements: Height: 5\' 11"  (180.3 cm) Weight: 79.4 kg (175 lb) IBW/kg (Calculated) : 75.3 Heparin Dosing Weight: 81.5 kg  Vital Signs: Temp: 98.8 F (37.1 C) (01/28 1231) Temp Source: Oral (01/28 0838) BP: 118/83 (01/28 1231) Pulse Rate: 81 (01/28 1231)  Labs: Recent Labs    03/28/20 1238 03/28/20 1553 03/28/20 2253 03/29/20 0512 03/29/20 1216 03/29/20 1801 03/30/20 0601  HGB 14.5  --   --  13.7  --   --  13.0  HCT 41.6  --   --  40.3  --   --  38.9*  PLT 268  --   --  256  --   --  235  APTT  --    < > 34 83* 124*  --   --   HEPARINUNFRC  --    < >  --  0.65 0.61 0.74*  --   CREATININE 0.87  --   --  1.21  --   --  1.15   < > = values in this interval not displayed.    Estimated Creatinine Clearance: 82.8 mL/min (by C-G formula based on SCr of 1.15 mg/dL).  Medications:  Infusions:  . heparin    . lactated ringers 0 mL (03/30/20 0819)  . [MAR Hold] piperacillin-tazobactam (ZOSYN)  IV 3.375 g (03/30/20 0311)   PTA:  Xarelto 20 mg daily with supper.  Last dose 1/25 PM  Assessment: 61 yoM admitted on 1/26 with acute cholecystitis.  PMH of atrial fibrillation and recent ablation on Xarelto. Pharmacy is consulted to bridge patient with IV heparin for Xarelto washout prior to surgery. Baseline HL > 2.2, aPTT 33.  Today, 03/30/2020:  Heparin infusion held since 2000 last PM  S/P lap chole today  Per surgery, resume heparin infusion at 2359 without bolus  Goal of Therapy:  Heparin level 0.3-0.7 units/ml aPTT 66-102 seconds Monitor platelets by anticoagulation protocol: Yes   Plan:  Resume heparin infusion at 1500 units/hr tonight at midnight Check a HL at 0600 F/U plans for  resuming Xarelto  Ulice Dash D  03/30/2020, 1:34 PM

## 2020-03-30 NOTE — Anesthesia Procedure Notes (Signed)
Procedure Name: Intubation Date/Time: 03/30/2020 10:41 AM Performed by: Lakeith Careaga D, CRNA Pre-anesthesia Checklist: Patient identified, Emergency Drugs available, Suction available and Patient being monitored Patient Re-evaluated:Patient Re-evaluated prior to induction Oxygen Delivery Method: Circle system utilized Preoxygenation: Pre-oxygenation with 100% oxygen Induction Type: IV induction Ventilation: Mask ventilation without difficulty Laryngoscope Size: Mac and 4 Grade View: Grade I Tube type: Oral Tube size: 7.5 mm Number of attempts: 1 Airway Equipment and Method: Stylet and Oral airway Placement Confirmation: ETT inserted through vocal cords under direct vision,  positive ETCO2 and breath sounds checked- equal and bilateral Secured at: 22 cm Tube secured with: Tape Dental Injury: Teeth and Oropharynx as per pre-operative assessment

## 2020-03-30 NOTE — Interval H&P Note (Signed)
History and Physical Interval Note:  03/30/2020 10:06 AM  Travis Kaufman  has presented today for surgery, with the diagnosis of CHOLELITHIASIS WITH CHOLECYSTITIS.  The various methods of treatment have been discussed with the patient and family. After consideration of risks, benefits and other options for treatment, the patient has consented to    Procedure(s): LAPAROSCOPIC CHOLECYSTECTOMY WITH INTRAOPERATIVE CHOLANGIOGRAM (N/A) as a surgical intervention.    The patient's history has been reviewed, patient examined, no change in status, stable for surgery.  I have reviewed the patient's chart and labs.  Questions were answered to the patient's satisfaction.    Armandina Gemma, MD [facility] Office: [place]

## 2020-03-30 NOTE — Progress Notes (Addendum)
Paged by pts RN, Latrice, at 1600. Pt with cc difficulty urinating. Per RN pt has been able to void post-op but had urinary hesitancy, which was not an issue pre-op. Denies dysuria. Denies bladder fullness. RN asked to perform bladder scan. If <300 cc urine will continue to monitor.   PO Robaxin was added for additional pain control.  Pt tolerated CLD at lunch after surgery and was advanced to FLD for dinner. Will continue to monitor urinary sxs.    Obie Dredge, PA-C  General & Trauma Surgery

## 2020-03-30 NOTE — Progress Notes (Signed)
PROGRESS NOTE    Patient: Travis Kaufman                            PCP: No primary care provider on file.                    DOB: [birth year]            DOA: 03/28/2020 [id number]:[id number]             DOS: 03/30/2020, 10:16 AM   LOS: 2 days   Date of Service: The patient was seen and examined on 03/30/2020  Subjective:   The patient was seen and examined, sleepy, groggy.  Received pain medication -  Persistent nausea received IV Phenergan, Zofran overnight. N.p.o. HIDA scan-consistent with cholecystitis Anticipating laparoscopic cholecystectomy this a.m.  Addendum: Patient was seen and examined.-Tolerated procedure well, currently stable   Brief Narrative:   This is a 50 year old male with past medical history of atrial fibrillation s/p ablation on 01/31/2020 with Dr. Rayann Heman currently on flecainide and Xarelto, GERD s/p Nissen fundoplication who presented to the ED with sudden onset RUQ and right flank pain associated with nausea and dry heaving.  Diagnosis repeat cholecystitis  CT renal stone study-abnormal gallbladder wall thickening and pericholecystic fluid suspicious for acute cholecystitis.   RUQ [place] suspicious for acute cholecystitis.   General surgery was consulted.  Patient is receiving Zosyn, IVF  NS, PRN Dilaudid, morphine, Tylenol.  Cardiology consult for clearance agree to hold Xarelto for now. N.p.o. scheduled this AM HIDA scan 03/29/2020 Home medication of Xarelto on hold.  Cholecystectomy in a.m. 03/30/2018  Assessment & Plan:   Active Problems:   Acute cholecystitis    1.  Suspected acute cholecystitis a. Persistent pain-discomfort-  b. N.p.o. c. No stones seen on imaging d. General surgery on board e. Holding Xarelto, CLD, IV hydration, Zosyn.  f. HIDA scan-consistent with cholecystitis g.  Anticipating laparoscopic cholecystectomy today 03/30/20 h. IV Phenergan/Zofran as needed  Addendum: status post laparoscopic cholecystectomy Postop day 0 -Remained  stable -We will continue to monitor closely  2. Atrial fibrillation s/p ablation on 01/31/2020 with Dr. Rayann Heman a. Currently in sinus rhythm -remained stable b. On flecainide and Xarelto outpatient c. Cardiology following- continue flecainide and hold Xarelto and start heparin bridge d. Holding heparin this morning e. Cardiology consulted -cleared patient for surgery     ----------------------------------------------------------------------------------------------------------------------------------------------- Nutritional status:  The patient's BMI is: Body mass index is 24.41 kg/m. I agree with the assessment and plan as outlined below:   Skin Assessment: I have examined the patient's skin and I agree with the wound assessment as performed by wound care team As outlined ....    ---------------------------------------------------------------------------------------------------------------------------------------------------- Cultures; None  Antimicrobials: IV Zosyn 03/28/2020 >>    Consultants: General surgery, cardiology   ------------------------------------------------------------------------------------------------------------------------------------------------  DVT prophylaxis:  Heparin drip Code Status:   Code Status: Full Code  Admission status:   Status is: Inpatient  Remains inpatient appropriate because:Inpatient level of care appropriate due to severity of illness   Dispo: The patient is from: Home              Anticipated d/c is to: Home              Anticipated d/c date is: 1-2 days               Patient currently is not medically stable to d/c.   Difficult to place patient Yes  Level of care: Telemetry   Procedures:   HIDA scan 03/29/2020 ?  Laparoscopic cholecystectomy 03/30/2020  Antimicrobials:  Anti-infectives (From admission, onward)   Start     Dose/Rate Route Frequency Ordered Stop   03/28/20 2000  [MAR Hold]   piperacillin-tazobactam (ZOSYN) IVPB 3.375 g        (MAR Hold since Fri 03/30/2020 at [place].Hold Reason: Transfer to a Procedural area.)   3.375 g 12.5 mL/hr over 240 Minutes Intravenous Every 8 hours 03/28/20 1444     03/28/20 1345  piperacillin-tazobactam (ZOSYN) IVPB 3.375 g        3.375 g 100 mL/hr over 30 Minutes Intravenous  Once 03/28/20 1331 03/28/20 1437       Medication:  . [MAR Hold] flecainide  75 mg Oral Q12H  . [MAR Hold] mupirocin ointment  1 application Nasal BID  . scopolamine  1 patch Transdermal On Call to OR  . [MAR Hold] sodium chloride flush  3 mL Intravenous Q12H    [MAR Hold] acetaminophen **OR** [MAR Hold] acetaminophen, [MAR Hold]  HYDROmorphone (DILAUDID) injection, [MAR Hold] ondansetron **OR** [MAR Hold] ondansetron (ZOFRAN) IV, [MAR Hold] promethazine   Objective:   Vitals:   03/30/20 0436 03/30/20 0737 03/30/20 0838 03/30/20 0840  BP: 112/65 103/65 112/67   Pulse: 62 (!) 53 (!) 54   Resp: 18 20 16    Temp: (!) 102.1 F (38.9 C) 99.7 F (37.6 C) 98.9 F (37.2 C)   TempSrc: Oral Oral Oral   SpO2: 94% 96% 92%   Weight:    79.4 kg  Height:    5\' 11"  (1.803 m)    Intake/Output Summary (Last 24 hours) at 03/30/2020 1016 Last data filed at 03/30/2020 0500 Gross per 24 hour  Intake 2843.47 ml  Output 500 ml  Net 2343.47 ml   Filed Weights   03/29/20 1712 03/30/20 0840  Weight: 81.5 kg 79.4 kg     Examination:      Physical Exam:   General:   Sleepy, groggy   HEENT:  Normocephalic, PERRL, otherwise with in Normal limits   Neuro:  CNII-XII intact. , normal motor and sensation, reflexes intact   Lungs:   Clear to auscultation BL, Respirations unlabored, no wheezes / crackles  Cardio:    S1/S2, RRR, No murmure, No Rubs or Gallops   Abdomen:   Soft, right upper quadrant abdominal pain, positive rebound tenderness, bowel sounds active all four quadrants,  no guarding or peritoneal signs.  Muscular skeletal:  Limited exam - in bed, able to  move all 4 extremities, Normal strength,  2+ pulses,  symmetric, No pitting edema  Skin:  Dry, warm to touch, negative for any Rashes,  Wounds: Please see nursing documentation    ------------------------------------------------------------------------------------------------------------------------------------------    LABs:  CBC Latest Ref Rng & Units 03/30/2020 03/29/2020 03/28/2020  WBC 4.0 - 10.5 K/uL 13.4(H) 14.5(H) 15.4(H)  Hemoglobin 13.0 - 17.0 g/dL 13.0 13.7 14.5  Hematocrit 39.0 - 52.0 % 38.9(L) 40.3 41.6  Platelets 150 - 400 K/uL 235 256 268   CMP Latest Ref Rng & Units 03/30/2020 03/29/2020 03/28/2020  Glucose 70 - 99 mg/dL 102(H) 94 102(H)  BUN 6 - 20 mg/dL 8 10 12   Creatinine 0.61 - 1.24 mg/dL 1.15 1.21 0.87  Sodium 135 - 145 mmol/L 137 136 138  Potassium 3.5 - 5.1 mmol/L 3.3(L) 4.1 4.1  Chloride 98 - 111 mmol/L 103 103 106  CO2 22 - 32 mmol/L 23 24 21(L)  Calcium 8.9 - 10.3 mg/dL  8.4(L) 8.6(L) 8.9  Total Protein 6.5 - 8.1 g/dL 6.1(L) 6.1(L) 6.7  Total Bilirubin 0.3 - 1.2 mg/dL 1.1 1.3(H) 0.9  Alkaline Phos 38 - 126 U/L 166(H) 89 71  AST 15 - 41 U/L 115(H) 126(H) 91(H)  ALT 0 - 44 U/L 164(H) 154(H) 62(H)       Micro Results Recent Results (from the past 240 hour(s))  SARS Coronavirus 2 by RT PCR (hospital order, performed in [place] hospital lab) Nasopharyngeal Nasopharyngeal Swab     Status: None   Collection Time: 03/28/20 12:07 PM   Specimen: Nasopharyngeal Swab  Result Value Ref Range Status   SARS Coronavirus 2 NEGATIVE NEGATIVE Final    Comment: (NOTE) SARS-CoV-2 target nucleic acids are NOT DETECTED.  The SARS-CoV-2 RNA is generally detectable in upper and lower respiratory specimens during the acute phase of infection. The lowest concentration of SARS-CoV-2 viral copies this assay can detect is 250 copies / mL. A negative result does not preclude SARS-CoV-2 infection and should not be used as the sole basis for treatment or other patient  management decisions.  A negative result may occur with improper specimen collection / handling, submission of specimen other than nasopharyngeal swab, presence of viral mutation(s) within the areas targeted by this assay, and inadequate number of viral copies (<250 copies / mL). A negative result must be combined with clinical observations, patient history, and epidemiological information.  Fact Sheet for Patients:   StrictlyIdeas.no  Fact Sheet for Healthcare Providers: BankingDealers.co.za  This test is not yet approved or  cleared by the [place] FDA and has been authorized for detection and/or diagnosis of SARS-CoV-2 by FDA under an Emergency Use Authorization (EUA).  This EUA will remain in effect (meaning this test can be used) for the duration of the COVID-19 declaration under Section 564(b)(1) of the Act, 21 U.S.C. section 360bbb-3(b)(1), unless the authorization is terminated or revoked sooner.  Performed at [facility], [place] [address], [place], [place] [address]   Surgical PCR screen     Status: Abnormal   Collection Time: 03/29/20  9:14 PM   Specimen: Nasal Mucosa; Nasal Swab  Result Value Ref Range Status   MRSA, PCR NEGATIVE NEGATIVE Final   Staphylococcus aureus POSITIVE (A) NEGATIVE Final    Comment: (NOTE) The Xpert SA Assay (FDA approved for NASAL specimens in patients 57 years of age and older), is one component of a comprehensive surveillance program. It is not intended to diagnose infection nor to guide or monitor treatment. Performed at [facility], [place] [address], [place], [place] [address]     Radiology Reports NM Hepatobiliary Liver Func  Result Date: 03/29/2020 CLINICAL DATA:  Evaluate for cholecystitis. EXAM: NUCLEAR MEDICINE HEPATOBILIARY IMAGING TECHNIQUE: Sequential images of the abdomen were obtained out to 60 minutes following intravenous administration  of radiopharmaceutical. RADIOPHARMACEUTICALS:  4.95 mCi Tc-55m  Choletec IV COMPARISON:  Gallbladder sonogram from 03/28/2020. FINDINGS: Prompt uptake and biliary excretion of activity by the liver is seen. Biliary activity passes into small bowel, consistent with patent common bile duct. Imaging was performed for 120 minutes without visualization of the gallbladder. IMPRESSION: 1. Nonvisualization of the gallbladder. Imaging findings are compatible with acute cholecystitis. 2. Patent common bile duct with normal biliary to bowel transit. Electronically Signed   By: Kerby Moors M.D.   On: 03/29/2020 11:26   CT Renal Stone Study  Result Date: 03/28/2020 CLINICAL DATA:  Right flank pain and right upper abdominal pain starting about midnight  last night, worse with movement. EXAM: CT ABDOMEN AND PELVIS WITHOUT CONTRAST TECHNIQUE: Multidetector CT imaging of the abdomen and pelvis was performed following the standard protocol without IV contrast. COMPARISON:  Overlapping portion of cardiac CT from 01/24/2020 FINDINGS: Lower chest: Unremarkable Hepatobiliary: Abnormal gallbladder wall thickening and pericholecystic fluid. Accentuated density in the gallbladder could be from gallstones, sludge, or debris. No CBD or common hepatic duct dilatation. Noncontrast 0.6 by 0.3 cm calcification or calcific nodule along the posterior margin of the right hepatic lobe on image 74 of series 6, significance uncertain but probably chronic. Pancreas: Unremarkable Spleen: Unremarkable Adrenals/Urinary Tract: Unremarkable Stomach/Bowel: Postoperative findings from fundoplication. Normal appendix. Otherwise unremarkable. Vascular/Lymphatic: Mild aortoiliac atherosclerotic vascular calcification. No pathologic adenopathy identified. Reproductive: Unremarkable Other: No supplemental non-categorized findings. Musculoskeletal: Lumbar spondylosis and degenerative disc disease. IMPRESSION: 1. Abnormal gallbladder wall thickening and  pericholecystic fluid, suspicious for acute cholecystitis. Correlate with clinical presentation. Accentuated density in the gallbladder could be from gallstones, sludge, or debris. 2. Postoperative findings from fundoplication. 3. Lumbar spondylosis and degenerative disc disease. 4. Aortic atherosclerosis. Aortic Atherosclerosis (ICD10-I70.0). Electronically Signed   By: Van Clines M.D.   On: 03/28/2020 11:44   US Abdomen Limited RUQ (LIVER/GB)  Result Date: 03/28/2020 CLINICAL DATA:  Pain EXAM: ULTRASOUND ABDOMEN LIMITED RIGHT UPPER QUADRANT COMPARISON:  CT 03/28/2020 FINDINGS: Gallbladder: Sludge noted within the gallbladder lumen. There is mild gallbladder wall thickening with minimal pericholecystic fluid. Sonographic Murphy sign positive per technologist. Common bile duct: Diameter: 5 mm Liver: No focal lesion identified. Within normal limits in parenchymal echogenicity. Portal vein is patent on color Doppler imaging with normal direction of blood flow towards the liver. Other: None. IMPRESSION: Sonographic findings suspicious for acute cholecystitis. Electronically Signed   By: Miachel Roux M.D.   On: 03/28/2020 12:37    SIGNED: Deatra [name], MD, FHM. [facility],  Pager (please use [email or web address] to page/text) Please use Epic Secure Chat for non-urgent communication (7AM-7PM)  If 7PM-7AM, please contact night-coverage [email or web address], 03/30/2020, 10:16 AM

## 2020-03-31 ENCOUNTER — Encounter (HOSPITAL_COMMUNITY): Payer: Self-pay | Admitting: Surgery

## 2020-03-31 DIAGNOSIS — K810 Acute cholecystitis: Secondary | ICD-10-CM | POA: Diagnosis not present

## 2020-03-31 DIAGNOSIS — I480 Paroxysmal atrial fibrillation: Secondary | ICD-10-CM | POA: Diagnosis not present

## 2020-03-31 LAB — HEPARIN LEVEL (UNFRACTIONATED): Heparin Unfractionated: 0.38 IU/mL (ref 0.30–0.70)

## 2020-03-31 LAB — COMPREHENSIVE METABOLIC PANEL
ALT: 160 U/L — ABNORMAL HIGH (ref 0–44)
AST: 98 U/L — ABNORMAL HIGH (ref 15–41)
Albumin: 3.4 g/dL — ABNORMAL LOW (ref 3.5–5.0)
Alkaline Phosphatase: 157 U/L — ABNORMAL HIGH (ref 38–126)
Anion gap: 9 (ref 5–15)
BUN: 5 mg/dL — ABNORMAL LOW (ref 6–20)
CO2: 28 mmol/L (ref 22–32)
Calcium: 8.7 mg/dL — ABNORMAL LOW (ref 8.9–10.3)
Chloride: 102 mmol/L (ref 98–111)
Creatinine, Ser: 0.97 mg/dL (ref 0.61–1.24)
GFR, Estimated: >60 mL/min (ref >60)
Glucose, Bld: 118 mg/dL — ABNORMAL HIGH (ref 70–99)
Potassium: 3.8 mmol/L (ref 3.5–5.1)
Sodium: 139 mmol/L (ref 135–145)
Total Bilirubin: 0.5 mg/dL (ref 0.3–1.2)
Total Protein: 6.6 g/dL (ref 6.5–8.1)

## 2020-03-31 LAB — CBC
HCT: 37.1 % — ABNORMAL LOW (ref 39.0–52.0)
Hemoglobin: 12.5 g/dL — ABNORMAL LOW (ref 13.0–17.0)
MCH: 32.9 pg (ref 26.0–34.0)
MCHC: 33.7 g/dL (ref 30.0–36.0)
MCV: 97.6 fL (ref 80.0–100.0)
Platelets: 241 10*3/uL (ref 150–400)
RBC: 3.8 MIL/uL — ABNORMAL LOW (ref 4.22–5.81)
RDW: 13.8 % (ref 11.5–15.5)
WBC: 12.8 10*3/uL — ABNORMAL HIGH (ref 4.0–10.5)
nRBC: 0 % (ref 0.0–0.2)

## 2020-03-31 MED ORDER — RIVAROXABAN 20 MG PO TABS: 20.0000 mg | ORAL_TABLET | Freq: Every day | ORAL | Status: DC

## 2020-03-31 MED ORDER — RIVAROXABAN 20 MG PO TABS
20.0000 mg | ORAL_TABLET | Freq: Every day | ORAL | Status: DC
  Administered 2020-03-31 – 2020-04-02 (×3): 20 mg via ORAL
  Filled 2020-03-31 (×3): qty 1

## 2020-03-31 NOTE — Plan of Care (Signed)

## 2020-03-31 NOTE — Progress Notes (Signed)
1 Day Post-Op   Subjective/Chief Complaint: Sore, but pain is controlled Voiding well without difficulty Hungry + BM  Labs pending   Objective: Vital signs in last 24 hours: Temp:  [97.8 F (36.6 C)-99.1 F (37.3 C)] 97.8 F (36.6 C) (01/29 0524) Pulse Rate:  [44-81] 44 (01/29 0524) Resp:  [13-20] 18 (01/29 0524) BP: (102-119)/(62-83) 115/73 (01/29 0524) SpO2:  [92 %-99 %] 98 % (01/29 0524) Weight:  [79.4 kg] 79.4 kg (01/28 0840) Last BM Date: 03/30/20  Intake/Output from previous day: 01/28 0701 - 01/29 0700 In: 3105.9 [P.O.:1380; I.V.:1628.7; IV Piggyback:97.2] Out: 1700 [Urine:1650; Blood:50] Intake/Output this shift: No intake/output data recorded.  General appearance: alert, cooperative and no distress GI: soft, mildly tender around incisions Some bruising around laparoscopic incisions  Lab Results:  Recent Labs    03/29/20 0512 03/30/20 0601  WBC 14.5* 13.4*  HGB 13.7 13.0  HCT 40.3 38.9*  PLT 256 235   BMET Recent Labs    03/29/20 0512 03/30/20 0601  NA 136 137  K 4.1 3.3*  CL 103 103  CO2 24 23  GLUCOSE 94 102*  BUN 10 8  CREATININE 1.21 1.15  CALCIUM 8.6* 8.4*   PT/INR No results for input(s): LABPROT, INR in the last 72 hours. ABG No results for input(s): PHART, HCO3 in the last 72 hours.  Invalid input(s): PCO2, PO2  Studies/Results: DG Cholangiogram Operative  Result Date: 03/30/2020 CLINICAL DATA:  Cholangiogram EXAM: INTRAOPERATIVE CHOLANGIOGRAM TECHNIQUE: Cholangiographic images from the C-arm fluoroscopic device were submitted for interpretation post-operatively. Please see the procedural report for the amount of contrast and the fluoroscopy time utilized. COMPARISON:  March 28, 2020 FINDINGS: Injection of contrast opacifies the biliary tree. There is no clear filling defect within the common bile duct. Contrast is seen flowing into the proximal small bowel. There is no biliary ductal dilatation. A cine clip and single image were  provided for evaluation. The total fluoroscopy time was 52 seconds. The total dose was 17.06 mGy. IMPRESSION: Status post intraoperative cholangiogram without evidence for definite filling defect. Electronically Signed   By: Constance Holster M.D.   On: 03/30/2020 14:00   NM Hepatobiliary Liver Func  Result Date: 03/29/2020 CLINICAL DATA:  Evaluate for cholecystitis. EXAM: NUCLEAR MEDICINE HEPATOBILIARY IMAGING TECHNIQUE: Sequential images of the abdomen were obtained out to 60 minutes following intravenous administration of radiopharmaceutical. RADIOPHARMACEUTICALS:  4.95 mCi Tc-49m  Choletec IV COMPARISON:  Gallbladder sonogram from 03/28/2020. FINDINGS: Prompt uptake and biliary excretion of activity by the liver is seen. Biliary activity passes into small bowel, consistent with patent common bile duct. Imaging was performed for 120 minutes without visualization of the gallbladder. IMPRESSION: 1. Nonvisualization of the gallbladder. Imaging findings are compatible with acute cholecystitis. 2. Patent common bile duct with normal biliary to bowel transit. Electronically Signed   By: Kerby Moors M.D.   On: 03/29/2020 11:26    Anti-infectives: Anti-infectives (From admission, onward)   Start     Dose/Rate Route Frequency Ordered Stop   03/28/20 2000  piperacillin-tazobactam (ZOSYN) IVPB 3.375 g        3.375 g 12.5 mL/hr over 240 Minutes Intravenous Every 8 hours 03/28/20 1444     03/28/20 1345  piperacillin-tazobactam (ZOSYN) IVPB 3.375 g        3.375 g 100 mL/hr over 30 Minutes Intravenous  Once 03/28/20 1331 03/28/20 1437      Assessment/Plan: Atrial fibrillation with RVR - on Xarelto - LD 03/27/20@7  PM - Flecanide - LD 03/27/20@ 7PM -  cardizem  Atrial ablation 88/11/03 Nissen fundoplication 03/07/92 Dr. Johney Maine  Acute abdominal pain Cholelithiasis with cholecystitis   FEN: IV fluids/clears >> NPO ID: [email or web address] 1/26 >> day 2 DVT: Xarelto LD 7 PM 1/25>> heparin per  cardiology  Plan: s/p laparoscopic cholecystectomy with [facility] 03/30/20  Advance diet May resume anticoagulation Ready for discharge from surgical standpoint once medical issues are addressed.    LOS: 3 days    Travis Kaufman 03/31/2020

## 2020-03-31 NOTE — Progress Notes (Signed)
No periop complications. Maintaining sinus rhythm throughout. Occasional asymptomatic moderate sinus bradycardia (40s). Restarting Xarelto today. [facility] will sign off.   Medication Recommendations:  Resume cardiac meds at previous doses Other recommendations (labs, testing, etc):  n/a Follow up as an outpatient:  Appt 05/07/2020 w Dr. Rayann Heman

## 2020-03-31 NOTE — Progress Notes (Signed)
ANTICOAGULATION CONSULT NOTE - Follow Up Consult  Pharmacy Consult for heparin --> xarelto Indication: atrial fibrillation, s/p recent ablation   Allergies  Allergen Reactions  . Wasp Venom Anaphylaxis and Hives  . Oxycodone Other (See Comments)    "wired & can't sleep"    Patient Measurements: Height: 5\' 11"  (180.3 cm) Weight: 79.4 kg (175 lb) IBW/kg (Calculated) : 75.3 Heparin Dosing Weight:   Vital Signs: Temp: 97.8 F (36.6 C) (01/29 0524) BP: 115/73 (01/29 0524) Pulse Rate: 44 (01/29 0524)  Labs: Recent Labs    03/28/20 2253 03/29/20 0512 03/29/20 1216 03/29/20 1801 03/30/20 0601 03/31/20 0800  HGB  --  13.7  --   --  13.0 12.5*  HCT  --  40.3  --   --  38.9* 37.1*  PLT  --  256  --   --  235 241  APTT 34 83* 124*  --   --   --   HEPARINUNFRC  --  0.65 0.61 0.74*  --  0.38  CREATININE  --  1.21  --   --  1.15 0.97    Estimated Creatinine Clearance: 98.1 mL/min (by C-G formula based on SCr of 0.97 mg/dL).   Medications:  - on xarelto 20 mg daily PTA (Last dose taken on 1/25 PM)  Assessment: Patient is a 50 y.o M with hx afib and recent ablation on xarelto PTA, presented to the ED on 1/26 with c/o abdominal pain.  He was subsequently found to have acute cholecystitis and underwent lap cholecystectomy on 1/28.  Patient was started on heparin drip on admission in anticipation for surgery. Pharmacy has been consulted to transition him back to [place] on 1/29.  Today, 03/31/2020: - cbc somewhat stable - no bleeding documented - scr <1, crcl~98 - Tbili wnl; AST/ALT elevated but trending down   Plan:  - d/c heparin drip at noon - start xarelto 20 mg daily - pharmacy will sign off for xarelto but will follow pt peripherally along with you  Deandrae Wajda P 03/31/2020,10:42 AM

## 2020-03-31 NOTE — Plan of Care (Signed)
  Problem: Activity: Goal: Risk for activity intolerance will decrease Outcome: Progressing   Problem: Nutrition: Goal: Adequate nutrition will be maintained Outcome: Progressing   Problem: Elimination: Goal: Will not experience complications related to bowel motility Outcome: Progressing   Problem: Safety: Goal: Ability to remain free from injury will improve Outcome: Progressing   Problem: Pain Managment: Goal: General experience of comfort will improve Outcome: Not Progressing  Patient still on IV pain medication q2hr. Patient still having RLQ abd. Patient is tolerating activity and diet well without any nausea.

## 2020-03-31 NOTE — Progress Notes (Signed)
PROGRESS NOTE    Travis Kaufman  NIO:[id number] DOB: 1970/09/12 DOA: 03/28/2020 PCP: No primary care provider on file.    Chief Complaint  Patient presents with  . Abdominal Pain    Brief Narrative:  50 year old gentleman prior history of atrial fibrillation s/p ablation on 01/31/2020 by Dr. Riki Sheer currently on flecainide and Xarelto, GERD s/p Nissen fundoplication presents to ED with right upper quadrant pain and flank pain associated with some nausea and dry heaving.  He was found to have and acute cholecystitis on right upper quadrant ultrasound. General surgery consulted and he underwent lap cholecystectomy with intraoperative cholangiography. Cardiology consulted for clearance. Assessment & Plan:   Active Problems:   Acute cholecystitis   Acute cholecystitis s/p lap cholecystectomy with intraoperative cholangiography Liver enzymes slowly improving. Continue with IV Zosyn and IV pain medication for now. Diet as tolerated at this time Patient is cleared for discharge from surgical standpoint.    Atrial fibrillation s/p ablation On flecainide, stop IV heparin and continue with Xarelto.    GERD S/p fundoplication.    Dry heaving and nausea Continue with Phenergan as needed.   DVT prophylaxis: Xarelto Code Status: Full code Family Communication: Wife at bedside Disposition:   Status is: Inpatient  Remains inpatient appropriate because:IV treatments appropriate due to intensity of illness or inability to take PO and Inpatient level of care appropriate due to severity of illness   Dispo: The patient is from: Home              Anticipated d/c is to: Home              Anticipated d/c date is: 2 days              Patient currently is not medically stable to d/c.   Difficult to place patient No   Level of care: Telemetry     Consultants:   Cardiology  General surgery  Procedures:  Antimicrobials: 1 Zosyn  Subjective: Persistent abdominal  pain  Objective: Vitals:   03/30/20 1633 03/30/20 2053 03/31/20 0524 03/31/20 1334  BP: 117/67 102/62 115/73 106/77  Pulse: (!) 57 (!) 56 (!) 44 (!) 52  Resp: 20 18 18 16   Temp: 98.5 F (36.9 C) 98.2 F (36.8 C) 97.8 F (36.6 C) 98.2 F (36.8 C)  TempSrc: Oral Oral  Oral  SpO2: 96% 95% 98% 95%  Weight:      Height:        Intake/Output Summary (Last 24 hours) at 03/31/2020 1650 Last data filed at 03/31/2020 1000 Gross per 24 hour  Intake 2865.9 ml  Output 1650 ml  Net 1215.9 ml   Filed Weights   03/29/20 1712 03/30/20 0840  Weight: 81.5 kg 79.4 kg    Examination:  General exam: Appears calm and comfortable  Respiratory system: Clear to auscultation. Respiratory effort normal. Cardiovascular system: S1 & S2 heard, bradycardic. No pedal edema. Gastrointestinal system: Abdomen is soft, moderately tender generalized, bowel sounds normal Central nervous system: Alert and oriented. No focal neurological deficits. Extremities: Symmetric 5 x 5 power. Skin: No rashes, lesions or ulcers Psychiatry:  Mood & affect appropriate.     Data Reviewed: I have personally reviewed following labs and imaging studies  CBC: Recent Labs  Lab 03/28/20 1238 03/29/20 0512 03/30/20 0601 03/31/20 0800  WBC 15.4* 14.5* 13.4* 12.8*  HGB 14.5 13.7 13.0 12.5*  HCT 41.6 40.3 38.9* 37.1*  MCV 93.5 96.2 96.8 97.6  PLT 268 256 235 241  Basic Metabolic Panel: Recent Labs  Lab 03/28/20 1238 03/29/20 0512 03/30/20 0601 03/31/20 0800  NA 138 136 137 139  K 4.1 4.1 3.3* 3.8  CL 106 103 103 102  CO2 21* 24 23 28   GLUCOSE 102* 94 102* 118*  BUN 12 10 8  5*  CREATININE 0.87 1.21 1.15 0.97  CALCIUM 8.9 8.6* 8.4* 8.7*    GFR: Estimated Creatinine Clearance: 98.1 mL/min (by C-G formula based on SCr of 0.97 mg/dL).  Liver Function Tests: Recent Labs  Lab 03/28/20 1238 03/29/20 0512 03/30/20 0601 03/31/20 0800  AST 91* 126* 115* 98*  ALT 62* 154* 164* 160*  ALKPHOS 71 89 166*  157*  BILITOT 0.9 1.3* 1.1 0.5  PROT 6.7 6.1* 6.1* 6.6  ALBUMIN 4.1 3.6 3.3* 3.4*    CBG: No results for input(s): GLUCAP in the last 168 hours.   Recent Results (from the past 240 hour(s))  SARS Coronavirus 2 by RT PCR (hospital order, performed in [facility] hospital lab) Nasopharyngeal Nasopharyngeal Swab     Status: None   Collection Time: 03/28/20 12:07 PM   Specimen: Nasopharyngeal Swab  Result Value Ref Range Status   SARS Coronavirus 2 NEGATIVE NEGATIVE Final    Comment: (NOTE) SARS-CoV-2 target nucleic acids are NOT DETECTED.  The SARS-CoV-2 RNA is generally detectable in upper and lower respiratory specimens during the acute phase of infection. The lowest concentration of SARS-CoV-2 viral copies this assay can detect is 250 copies / mL. A negative result does not preclude SARS-CoV-2 infection and should not be used as the sole basis for treatment or other patient management decisions.  A negative result may occur with improper specimen collection / handling, submission of specimen other than nasopharyngeal swab, presence of viral mutation(s) within the areas targeted by this assay, and inadequate number of viral copies (<250 copies / mL). A negative result must be combined with clinical observations, patient history, and epidemiological information.  Fact Sheet for Patients:   StrictlyIdeas.no  Fact Sheet for Healthcare Providers: BankingDealers.co.za  This test is not yet approved or  cleared by the [place] FDA and has been authorized for detection and/or diagnosis of SARS-CoV-2 by FDA under an Emergency Use Authorization (EUA).  This EUA will remain in effect (meaning this test can be used) for the duration of the COVID-19 declaration under Section 564(b)(1) of the Act, 21 U.S.C. section 360bbb-3(b)(1), unless the authorization is terminated or revoked sooner.  Performed at [facility],  [place] [address], [place], [place] [address]   Surgical PCR screen     Status: Abnormal   Collection Time: 03/29/20  9:14 PM   Specimen: Nasal Mucosa; Nasal Swab  Result Value Ref Range Status   MRSA, PCR NEGATIVE NEGATIVE Final   Staphylococcus aureus POSITIVE (A) NEGATIVE Final    Comment: (NOTE) The Xpert SA Assay (FDA approved for NASAL specimens in patients 79 years of age and older), is one component of a comprehensive surveillance program. It is not intended to diagnose infection nor to guide or monitor treatment. Performed at [facility], [place] [address] [place], [place] [address]   Culture, blood (routine x 2)     Status: None (Preliminary result)   Collection Time: 03/30/20  1:22 PM   Specimen: BLOOD RIGHT HAND  Result Value Ref Range Status   Specimen Description   Final    BLOOD RIGHT HAND Performed at [place] [address] [place],  [address]  Special Requests   Final    BOTTLES DRAWN AEROBIC ONLY Blood Culture adequate volume Performed at [facility], [place] [address], [place], [place] [address]    Culture PENDING  Incomplete   Report Status PENDING  Incomplete         Radiology Studies: DG Cholangiogram Operative  Result Date: 03/30/2020 CLINICAL DATA:  Cholangiogram EXAM: INTRAOPERATIVE CHOLANGIOGRAM TECHNIQUE: Cholangiographic images from the C-arm fluoroscopic device were submitted for interpretation post-operatively. Please see the procedural report for the amount of contrast and the fluoroscopy time utilized. COMPARISON:  March 28, 2020 FINDINGS: Injection of contrast opacifies the biliary tree. There is no clear filling defect within the common bile duct. Contrast is seen flowing into the proximal small bowel. There is no biliary ductal dilatation. A cine clip and single image were provided for evaluation. The total fluoroscopy time was 52 seconds. The total dose was 17.06 mGy. IMPRESSION:  Status post intraoperative cholangiogram without evidence for definite filling defect. Electronically Signed   By: Constance Holster M.D.   On: 03/30/2020 14:00        Scheduled Meds: . acetaminophen  650 mg Oral Q6H  . flecainide  75 mg Oral Q12H  . lidocaine  1 patch Transdermal Q24H  . mupirocin ointment  1 application Nasal BID  . rivaroxaban  20 mg Oral Q supper  . sodium chloride flush  3 mL Intravenous Q12H   Continuous Infusions: . lactated ringers 75 mL/hr at 03/31/20 0500  . piperacillin-tazobactam (ZOSYN)  IV 3.375 g (03/31/20 1250)     LOS: 3 days        Hosie Poisson, MD [facility]   To contact the attending provider between 7A-7P or the covering provider during after hours 7P-7A, please log into the web site [email or web address] and access using universal [place] password for that web site. If you do not have the password, please call the hospital operator.  03/31/2020, 4:50 PM

## 2020-04-01 DIAGNOSIS — K810 Acute cholecystitis: Secondary | ICD-10-CM | POA: Diagnosis not present

## 2020-04-01 DIAGNOSIS — Z419 Encounter for procedure for purposes other than remedying health state, unspecified: Secondary | ICD-10-CM

## 2020-04-01 LAB — COMPREHENSIVE METABOLIC PANEL
ALT: 106 U/L — ABNORMAL HIGH (ref 0–44)
AST: 47 U/L — ABNORMAL HIGH (ref 15–41)
Albumin: 3 g/dL — ABNORMAL LOW (ref 3.5–5.0)
Alkaline Phosphatase: 115 U/L (ref 38–126)
Anion gap: 8 (ref 5–15)
BUN: 6 mg/dL (ref 6–20)
CO2: 28 mmol/L (ref 22–32)
Calcium: 8.4 mg/dL — ABNORMAL LOW (ref 8.9–10.3)
Chloride: 105 mmol/L (ref 98–111)
Creatinine, Ser: 1.07 mg/dL (ref 0.61–1.24)
GFR, Estimated: >60 mL/min (ref >60)
Glucose, Bld: 120 mg/dL — ABNORMAL HIGH (ref 70–99)
Potassium: 3.3 mmol/L — ABNORMAL LOW (ref 3.5–5.1)
Sodium: 141 mmol/L (ref 135–145)
Total Bilirubin: 0.4 mg/dL (ref 0.3–1.2)
Total Protein: 5.7 g/dL — ABNORMAL LOW (ref 6.5–8.1)

## 2020-04-01 LAB — CBC
HCT: 33.2 % — ABNORMAL LOW (ref 39.0–52.0)
Hemoglobin: 11 g/dL — ABNORMAL LOW (ref 13.0–17.0)
MCH: 32.3 pg (ref 26.0–34.0)
MCHC: 33.1 g/dL (ref 30.0–36.0)
MCV: 97.4 fL (ref 80.0–100.0)
Platelets: 261 10*3/uL (ref 150–400)
RBC: 3.41 MIL/uL — ABNORMAL LOW (ref 4.22–5.81)
RDW: 14.2 % (ref 11.5–15.5)
WBC: 10.9 10*3/uL — ABNORMAL HIGH (ref 4.0–10.5)
nRBC: 0 % (ref 0.0–0.2)

## 2020-04-01 LAB — MAGNESIUM: Magnesium: 2.1 mg/dL (ref 1.7–2.4)

## 2020-04-01 MED ORDER — HYDROCODONE-ACETAMINOPHEN 5-325 MG PO TABS
1.0000 | ORAL_TABLET | ORAL | Status: DC | PRN
  Administered 2020-04-01: 2 via ORAL
  Administered 2020-04-02 (×3): 1 via ORAL
  Administered 2020-04-02: 2 via ORAL
  Filled 2020-04-01: qty 1
  Filled 2020-04-01 (×2): qty 2
  Filled 2020-04-01 (×2): qty 1

## 2020-04-01 MED ORDER — POTASSIUM CHLORIDE CRYS ER 20 MEQ PO TBCR
40.0000 meq | EXTENDED_RELEASE_TABLET | Freq: Once | ORAL | Status: AC
  Administered 2020-04-01: 40 meq via ORAL
  Filled 2020-04-01: qty 2

## 2020-04-01 NOTE — Progress Notes (Signed)
2 Days Post-Op   Subjective/Chief Complaint: Less tenderness Mild nausea Some bruising around umbilicus - on hep gtt   Objective: Vital signs in last 24 hours: Temp:  [98.2 F (36.8 C)-99.5 F (37.5 C)] 99.5 F (37.5 C) (01/30 0429) Pulse Rate:  [51-52] 51 (01/30 0429) Resp:  [16-18] 18 (01/29 2125) BP: (104-114)/(72-81) 114/81 (01/30 0429) SpO2:  [95 %] 95 % (01/30 0429) Last BM Date: 03/30/20  Intake/Output from previous day: 01/29 0701 - 01/30 0700 In: 960 [P.O.:960] Out: -  Intake/Output this shift: No intake/output data recorded.  General appearance: alert, cooperative and no distress GI: soft, minimal tenderness Incisions intact; slightly increased bruising around umbilicus  Lab Results:  Recent Labs    03/31/20 0800 04/01/20 0628  WBC 12.8* 10.9*  HGB 12.5* 11.0*  HCT 37.1* 33.2*  PLT 241 261   BMET Recent Labs    03/31/20 0800 04/01/20 0628  NA 139 141  K 3.8 3.3*  CL 102 105  CO2 28 28  GLUCOSE 118* 120*  BUN 5* 6  CREATININE 0.97 1.07  CALCIUM 8.7* 8.4*   Hepatic Function Latest Ref Rng & Units 04/01/2020 03/31/2020 03/30/2020  Total Protein 6.5 - 8.1 g/dL 5.7(L) 6.6 6.1(L)  Albumin 3.5 - 5.0 g/dL 3.0(L) 3.4(L) 3.3(L)  AST 15 - 41 U/L 47(H) 98(H) 115(H)  ALT 0 - 44 U/L 106(H) 160(H) 164(H)  Alk Phosphatase 38 - 126 U/L 115 157(H) 166(H)  Total Bilirubin 0.3 - 1.2 mg/dL 0.4 0.5 1.1    PT/INR No results for input(s): LABPROT, INR in the last 72 hours. ABG No results for input(s): PHART, HCO3 in the last 72 hours.  Invalid input(s): PCO2, PO2  Studies/Results: DG Cholangiogram Operative  Result Date: 03/30/2020 CLINICAL DATA:  Cholangiogram EXAM: INTRAOPERATIVE CHOLANGIOGRAM TECHNIQUE: Cholangiographic images from the C-arm fluoroscopic device were submitted for interpretation post-operatively. Please see the procedural report for the amount of contrast and the fluoroscopy time utilized. COMPARISON:  March 28, 2020 FINDINGS: Injection of  contrast opacifies the biliary tree. There is no clear filling defect within the common bile duct. Contrast is seen flowing into the proximal small bowel. There is no biliary ductal dilatation. A cine clip and single image were provided for evaluation. The total fluoroscopy time was 52 seconds. The total dose was 17.06 mGy. IMPRESSION: Status post intraoperative cholangiogram without evidence for definite filling defect. Electronically Signed   By: Constance Holster M.D.   On: 03/30/2020 14:00    Anti-infectives: Anti-infectives (From admission, onward)   Start     Dose/Rate Route Frequency Ordered Stop   03/28/20 2000  piperacillin-tazobactam (ZOSYN) IVPB 3.375 g        3.375 g 12.5 mL/hr over 240 Minutes Intravenous Every 8 hours 03/28/20 1444     03/28/20 1345  piperacillin-tazobactam (ZOSYN) IVPB 3.375 g        3.375 g 100 mL/hr over 30 Minutes Intravenous  Once 03/28/20 1331 03/28/20 1437      Assessment/Plan: Atrial fibrillation with RVR - on Xarelto - LD 03/27/20$RemoveBef'@7'[id number]$  PM - Flecanide - LD 03/27/20@ 7PM - cardizem  Atrial ablation 77/82/42 Nissen fundoplication 05/06/34 Dr. Johney Maine  Acute abdominal pain Cholelithiasis with cholecystitis   FEN: IV fluids/clears>> NPO ID: [id number] >> day 2 DVT: Xarelto LD 7 PM1/25>> heparin per cardiology  Plan:s/p laparoscopic cholecystectomy with [facility] 03/30/20  Advance diet May resume anticoagulation Ready for discharge from surgical standpoint once medical issues are addressed.  Will sign off for now, but feel free to call us  with any questions.    LOS: 4 days    Travis Kaufman 04/01/2020

## 2020-04-01 NOTE — Progress Notes (Signed)
PROGRESS NOTE    Travis Kaufman  [id number] DOB: 03-14-1970 DOA: 03/28/2020 PCP: No primary care provider on file.    Chief Complaint  Patient presents with  . Abdominal Pain    Brief Narrative:  50 year old gentleman prior history of atrial fibrillation s/p ablation on 01/31/2020 by Dr. Riki Sheer currently on flecainide and Xarelto, GERD s/p Nissen fundoplication presents to ED with right upper quadrant pain and flank pain associated with some nausea and dry heaving.  He was found to have and acute cholecystitis on right upper quadrant ultrasound. General surgery consulted and he underwent lap cholecystectomy with intraoperative cholangiography. Cardiology consulted for clearance. Pt seen and examined  Assessment & Plan:   Active Problems:   Acute cholecystitis   Acute cholecystitis s/p lap cholecystectomy with intraoperative cholangiography Liver enzymes slowly improving. Continue with IV Zosyn and IV pain medication for now.  Patient is currently requiring IV Dilaudid for pain control.  Added Vicodin to see if he will able to tolerate it.  If he is able to tolerate Vicodin/any oral pain medication will plan for discharge in the morning. Diet as tolerated at this time Patient is cleared for discharge from surgical standpoint. T-max is 99.8 and WBC count has improved to 10.9   Atrial fibrillation s/p ablation On flecainide and Xarelto for anticoagulation. Rate controlled    GERD S/p fundoplication.    Dry heaving and nausea Continue with Phenergan as needed. Nausea has improved.   Hypokalemia Replaced, magnesium is greater than 2.    Acute anemia of blood loss/anemia of acute illness Patient's hemoglobin at baseline appears to be between 13-15.  Dropped to 11g today.  Continue to monitor.   DVT prophylaxis: Xarelto Code Status: Full code Family Communication: Wife at bedside Disposition:   Status is: Inpatient  Remains inpatient appropriate  because:IV treatments appropriate due to intensity of illness or inability to take PO and Inpatient level of care appropriate due to severity of illness   Dispo: The patient is from: Home              Anticipated d/c is to: Home              Anticipated d/c date is: 1 day              Patient currently is not medically stable to d/c.   Difficult to place patient No   Level of care: Telemetry     Consultants:   Cardiology  General surgery  Procedures:  Antimicrobials: 1 Zosyn  Subjective: Persistent abdominal pain at the site of incisions, nausea has improved  Objective: Vitals:   03/31/20 1334 03/31/20 2125 04/01/20 0429 04/01/20 1237  BP: 106/77 104/72 114/81 114/72  Pulse: (!) 52 (!) 52 (!) 51 61  Resp: 16 18  20   Temp: 98.2 F (36.8 C) 98.6 F (37 C) 99.5 F (37.5 C) 99.8 F (37.7 C)  TempSrc: Oral Oral Oral Oral  SpO2: 95% 95% 95% 90%  Weight:      Height:        Intake/Output Summary (Last 24 hours) at 04/01/2020 1241 Last data filed at 03/31/2020 1804 Gross per 24 hour  Intake 600 ml  Output -  Net 600 ml   Filed Weights   03/29/20 1712 03/30/20 0840  Weight: 81.5 kg 79.4 kg    Examination:  General exam: Alert and comfortable, not in distress Respiratory system: Air entry fair bilateral, no wheezing or rhonchi Cardiovascular system: S1-S2 heard, regular rate rhythm, no  JVD. Gastrointestinal system: Abdomen is soft, mild to moderate tenderness at the site of incisions in the umbilical area. Central nervous system: Alert and oriented, grossly nonfocal Extremities: No pedal edema Skin: No rashes seen Psychiatry: Patient appears anxious    Data Reviewed: I have personally reviewed following labs and imaging studies  CBC: Recent Labs  Lab 03/28/20 1238 03/29/20 0512 03/30/20 0601 03/31/20 0800 04/01/20 0628  WBC 15.4* 14.5* 13.4* 12.8* 10.9*  HGB 14.5 13.7 13.0 12.5* 11.0*  HCT 41.6 40.3 38.9* 37.1* 33.2*  MCV 93.5 96.2 96.8 97.6 97.4   PLT 268 256 235 241 562    Basic Metabolic Panel: Recent Labs  Lab 03/28/20 1238 03/29/20 0512 03/30/20 0601 03/31/20 0800 04/01/20 0628  NA 138 136 137 139 141  K 4.1 4.1 3.3* 3.8 3.3*  CL 106 103 103 102 105  CO2 21* 24 23 28 28   GLUCOSE 102* 94 102* 118* 120*  BUN 12 10 8  5* 6  CREATININE 0.87 1.21 1.15 0.97 1.07  CALCIUM 8.9 8.6* 8.4* 8.7* 8.4*  MG  --   --   --   --  2.1    GFR: Estimated Creatinine Clearance: 88.9 mL/min (by C-G formula based on SCr of 1.07 mg/dL).  Liver Function Tests: Recent Labs  Lab 03/28/20 1238 03/29/20 0512 03/30/20 0601 03/31/20 0800 04/01/20 0628  AST 91* 126* 115* 98* 47*  ALT 62* 154* 164* 160* 106*  ALKPHOS 71 89 166* 157* 115  BILITOT 0.9 1.3* 1.1 0.5 0.4  PROT 6.7 6.1* 6.1* 6.6 5.7*  ALBUMIN 4.1 3.6 3.3* 3.4* 3.0*    CBG: No results for input(s): GLUCAP in the last 168 hours.   Recent Results (from the past 240 hour(s))  SARS Coronavirus 2 by RT PCR (hospital order, performed in [facility] hospital lab) Nasopharyngeal Nasopharyngeal Swab     Status: None   Collection Time: 03/28/20 12:07 PM   Specimen: Nasopharyngeal Swab  Result Value Ref Range Status   SARS Coronavirus 2 NEGATIVE NEGATIVE Final    Comment: (NOTE) SARS-CoV-2 target nucleic acids are NOT DETECTED.  The SARS-CoV-2 RNA is generally detectable in upper and lower respiratory specimens during the acute phase of infection. The lowest concentration of SARS-CoV-2 viral copies this assay can detect is 250 copies / mL. A negative result does not preclude SARS-CoV-2 infection and should not be used as the sole basis for treatment or other patient management decisions.  A negative result may occur with improper specimen collection / handling, submission of specimen other than nasopharyngeal swab, presence of viral mutation(s) within the areas targeted by this assay, and inadequate number of viral copies (<250 copies / mL). A negative result must be combined  with clinical observations, patient history, and epidemiological information.  Fact Sheet for Patients:   StrictlyIdeas.no  Fact Sheet for Healthcare Providers: BankingDealers.co.za  This test is not yet approved or  cleared by the [place] FDA and has been authorized for detection and/or diagnosis of SARS-CoV-2 by FDA under an Emergency Use Authorization (EUA).  This EUA will remain in effect (meaning this test can be used) for the duration of the COVID-19 declaration under Section 564(b)(1) of the Act, 21 U.S.C. section 360bbb-3(b)(1), unless the authorization is terminated or revoked sooner.  Performed at [facility], [place] [address] [place], [place] [address]   Surgical PCR screen     Status: Abnormal   Collection Time: 03/29/20  9:14 PM   Specimen: Nasal Mucosa; Nasal Swab  Result Value Ref Range Status   MRSA, PCR NEGATIVE NEGATIVE Final   Staphylococcus aureus POSITIVE (A) NEGATIVE Final    Comment: (NOTE) The Xpert SA Assay (FDA approved for NASAL specimens in patients 49 years of age and older), is one component of a comprehensive surveillance program. It is not intended to diagnose infection nor to guide or monitor treatment. Performed at [facility], [place] [address], [place], [place] [address]   Culture, blood (routine x 2)     Status: None (Preliminary result)   Collection Time: 03/30/20  1:22 PM   Specimen: BLOOD RIGHT HAND  Result Value Ref Range Status   Specimen Description   Final    BLOOD RIGHT HAND Performed at [place] [address] [place], [place] [address]    Special Requests   Final    BOTTLES DRAWN AEROBIC ONLY Blood Culture adequate volume Performed at [facility] [address] [place], [place] [address]    Culture   Final    NO GROWTH 2 DAYS Performed at [facility] [address]  [place], [place] [address]    Report Status PENDING  Incomplete  Culture, blood (routine x 2)     Status: None (Preliminary result)   Collection Time: 03/30/20  1:22 PM   Specimen: BLOOD RIGHT HAND  Result Value Ref Range Status   Specimen Description   Final    BLOOD RIGHT HAND Performed at [facility] [address] [place], [place] [address]    Special Requests   Final    BOTTLES DRAWN AEROBIC ONLY Blood Culture adequate volume   Culture   Final    NO GROWTH 2 DAYS Performed at [facility], [place] [address], [place], [place] [address]    Report Status PENDING  Incomplete         Radiology Studies: No results found.      Scheduled Meds: . acetaminophen  650 mg Oral Q6H  . flecainide  75 mg Oral Q12H  . lidocaine  1 patch Transdermal Q24H  . mupirocin ointment  1 application Nasal BID  . potassium chloride  40 mEq Oral Once  . rivaroxaban  20 mg Oral Q supper  . sodium chloride flush  3 mL Intravenous Q12H   Continuous Infusions: . lactated ringers 75 mL/hr at 03/31/20 0500  . piperacillin-tazobactam (ZOSYN)  IV 3.375 g (04/01/20 0425)     LOS: 4 days        Hosie Poisson, MD [facility]   To contact the attending provider between 7A-7P or the covering provider during after hours 7P-7A, please log into the web site [email or web address] and access using universal [place] password for that web site. If you do not have the password, please call the hospital operator.  04/01/2020, 12:41 PM

## 2020-04-02 ENCOUNTER — Inpatient Hospital Stay (HOSPITAL_COMMUNITY): Payer: 59

## 2020-04-02 DIAGNOSIS — K810 Acute cholecystitis: Secondary | ICD-10-CM | POA: Diagnosis not present

## 2020-04-02 DIAGNOSIS — Z419 Encounter for procedure for purposes other than remedying health state, unspecified: Secondary | ICD-10-CM | POA: Diagnosis not present

## 2020-04-02 LAB — COMPREHENSIVE METABOLIC PANEL
ALT: 85 U/L — ABNORMAL HIGH (ref 0–44)
AST: 35 U/L (ref 15–41)
Albumin: 3.1 g/dL — ABNORMAL LOW (ref 3.5–5.0)
Alkaline Phosphatase: 123 U/L (ref 38–126)
Anion gap: 10 (ref 5–15)
BUN: 11 mg/dL (ref 6–20)
CO2: 26 mmol/L (ref 22–32)
Calcium: 8.7 mg/dL — ABNORMAL LOW (ref 8.9–10.3)
Chloride: 105 mmol/L (ref 98–111)
Creatinine, Ser: 1.29 mg/dL — ABNORMAL HIGH (ref 0.61–1.24)
GFR, Estimated: >60 mL/min (ref >60)
Glucose, Bld: 96 mg/dL (ref 70–99)
Potassium: 4 mmol/L (ref 3.5–5.1)
Sodium: 141 mmol/L (ref 135–145)
Total Bilirubin: 0.4 mg/dL (ref 0.3–1.2)
Total Protein: 6.3 g/dL — ABNORMAL LOW (ref 6.5–8.1)

## 2020-04-02 LAB — CBC
HCT: 35.1 % — ABNORMAL LOW (ref 39.0–52.0)
Hemoglobin: 11.6 g/dL — ABNORMAL LOW (ref 13.0–17.0)
MCH: 32.1 pg (ref 26.0–34.0)
MCHC: 33 g/dL (ref 30.0–36.0)
MCV: 97.2 fL (ref 80.0–100.0)
Platelets: 285 10*3/uL (ref 150–400)
RBC: 3.61 MIL/uL — ABNORMAL LOW (ref 4.22–5.81)
RDW: 13.9 % (ref 11.5–15.5)
WBC: 10.6 10*3/uL — ABNORMAL HIGH (ref 4.0–10.5)
nRBC: 0 % (ref 0.0–0.2)

## 2020-04-02 LAB — SURGICAL PATHOLOGY

## 2020-04-02 MED ORDER — HYDROCORTISONE 1 % EX CREA
TOPICAL_CREAM | Freq: Four times a day (QID) | CUTANEOUS | Status: DC
  Administered 2020-04-02: 1 via TOPICAL
  Filled 2020-04-02: qty 28

## 2020-04-02 MED ORDER — ACETAMINOPHEN 325 MG PO TABS: 650.0000 mg | ORAL_TABLET | Freq: Four times a day (QID) | ORAL | Status: DC | PRN

## 2020-04-02 MED ORDER — SODIUM CHLORIDE 0.9 % IV SOLN: INTRAVENOUS | Status: DC

## 2020-04-02 MED ORDER — AMOXICILLIN-POT CLAVULANATE 875-125 MG PO TABS
1.0000 | ORAL_TABLET | Freq: Two times a day (BID) | ORAL | Status: DC
  Administered 2020-04-02 – 2020-04-03 (×2): 1 via ORAL
  Filled 2020-04-02 (×2): qty 1

## 2020-04-02 NOTE — Progress Notes (Addendum)
PROGRESS NOTE    Travis Kaufman  [id number] DOB: 1970-11-05 DOA: 03/28/2020 PCP: No primary care provider on file.    Chief Complaint  Patient presents with   Abdominal Pain    Brief Narrative:  50 year old gentleman prior history of atrial fibrillation s/p ablation on 01/31/2020 by Dr. Riki Sheer currently on flecainide and Xarelto, GERD s/p Nissen fundoplication presents to ED with right upper quadrant pain and flank pain associated with some nausea and dry heaving.  He was found to have and acute cholecystitis on right upper quadrant ultrasound. General surgery consulted and he underwent lap cholecystectomy with intraoperative cholangiography. Cardiology consulted for clearance. Overnight patient was febrile up to 102.5.  And this morning his temp was 99.4. Patient reports abdominal pain has improved but not completely resolved.  No nausea vomiting.  No rash chest pain shortness of breath. Assessment & Plan:   Active Problems:   Acute cholecystitis   Acute cholecystitis s/p lap cholecystectomy with intraoperative cholangiography Liver enzymes slowly improving.  Nausea vomiting has resolved abdominal pain has improved and days tolerable with oral Vicodin. Diet as tolerated at this time Patient is cleared for discharge from surgical standpoint. T-max 102.5, WBC count improving/stable   Atrial fibrillation s/p ablation On flecainide and Xarelto for anticoagulation. Rate controlled, no changes in medication    GERD S/p fundoplication.    Dry heaving and nausea Have improved   Hypokalemia Replaced, magnesium is greater than 2.    Acute anemia of blood loss/anemia of acute illness Patient's hemoglobin at baseline appears to be between 13-15.  Dropped to 11, and has been stable around 11.6.   Fever/right basilar pneumonia Chest x-ray showed right basilar pneumonia. Patient denies any chest pain or shortness of breath. IV Zosyn changed to Augmentin to  complete the course for right-sided pneumonia. Patient denies any cough at this time. Continue with incentive spirometry. Ambulate as tolerated.   Blanchable erythematous rash on the back Suspect contact dermatitis Hydrocortisone cream to help with itching.   DVT prophylaxis: Xarelto Code Status: Full code Family Communication: Wife at bedside Disposition:   Status is: Inpatient  Remains inpatient appropriate because:IV treatments appropriate due to intensity of illness or inability to take PO and Inpatient level of care appropriate due to severity of illness   Dispo: The patient is from: Home              Anticipated d/c is to: Home              Anticipated d/c date is: 1 day              Patient currently is not medically stable to d/c.   Difficult to place patient No   Level of care: Telemetry     Consultants:   Cardiology  General surgery  Procedures:  Antimicrobials: 1 Zosyn  Subjective: Some pain around umbilicus, no nausea vomiting, no chest pain shortness of breath or cough  Objective: Vitals:   04/02/20 0057 04/02/20 0341 04/02/20 0811 04/02/20 1400  BP: 112/70 108/69 107/74 103/68  Pulse: 60 (!) 52 (!) 50 (!) 54  Resp: 18 20  (!) 8  Temp: 99.9 F (37.7 C) 98.2 F (36.8 C) 99.4 F (37.4 C) 99.3 F (37.4 C)  TempSrc: Axillary Oral Oral Oral  SpO2: 93% 90% 96% 98%  Weight:      Height:        Intake/Output Summary (Last 24 hours) at 04/02/2020 1834 Last data filed at 04/02/2020 1108 Gross per 24  hour  Intake 293 ml  Output 2370 ml  Net -2077 ml   Filed Weights   03/29/20 1712 03/30/20 0840  Weight: 81.5 kg 79.4 kg    Examination:  General exam alert, comfortable not in any kind of distress Respiratory system: Air entry fair bilateral, no wheezing or rhonchi Cardiovascular system: S1-S2 heard, regular rate rhythm, no JVD. Gastrointestinal system: Abdomen is soft, mild tenderness around the umbilicus at the incision site nondistended  bowel sounds normal Central nervous system: Alert and oriented, grossly nonfocal Extremities: No pedal edema Skin: The incision sites look clean and dry. Psychiatry: Mood is appropriate    Data Reviewed: I have personally reviewed following labs and imaging studies  CBC: Recent Labs  Lab 03/29/20 0512 03/30/20 0601 03/31/20 0800 04/01/20 0628 04/02/20 0521  WBC 14.5* 13.4* 12.8* 10.9* 10.6*  HGB 13.7 13.0 12.5* 11.0* 11.6*  HCT 40.3 38.9* 37.1* 33.2* 35.1*  MCV 96.2 96.8 97.6 97.4 97.2  PLT 256 235 241 261 [id number]    Basic Metabolic Panel: Recent Labs  Lab 03/29/20 0512 03/30/20 0601 03/31/20 0800 04/01/20 0628 04/02/20 0521  NA 136 137 139 141 141  K 4.1 3.3* 3.8 3.3* 4.0  CL 103 103 102 105 105  CO2 24 23 28 28 26   GLUCOSE 94 102* 118* 120* 96  BUN 10 8 5* 6 11  CREATININE 1.21 1.15 0.97 1.07 1.29*  CALCIUM 8.6* 8.4* 8.7* 8.4* 8.7*  MG  --   --   --  2.1  --     GFR: Estimated Creatinine Clearance: 73.8 mL/min (A) (by C-G formula based on SCr of 1.29 mg/dL (H)).  Liver Function Tests: Recent Labs  Lab 03/29/20 0512 03/30/20 0601 03/31/20 0800 04/01/20 0628 04/02/20 0521  AST 126* 115* 98* 47* 35  ALT 154* 164* 160* 106* 85*  ALKPHOS 89 166* 157* 115 123  BILITOT 1.3* 1.1 0.5 0.4 0.4  PROT 6.1* 6.1* 6.6 5.7* 6.3*  ALBUMIN 3.6 3.3* 3.4* 3.0* 3.1*    CBG: No results for input(s): GLUCAP in the last 168 hours.   Recent Results (from the past 240 hour(s))  SARS Coronavirus 2 by RT PCR (hospital order, performed in [facility] hospital lab) Nasopharyngeal Nasopharyngeal Swab     Status: None   Collection Time: 03/28/20 12:07 PM   Specimen: Nasopharyngeal Swab  Result Value Ref Range Status   SARS Coronavirus 2 NEGATIVE NEGATIVE Final    Comment: (NOTE) SARS-CoV-2 target nucleic acids are NOT DETECTED.  The SARS-CoV-2 RNA is generally detectable in upper and lower respiratory specimens during the acute phase of infection. The lowest concentration  of SARS-CoV-2 viral copies this assay can detect is 250 copies / mL. A negative result does not preclude SARS-CoV-2 infection and should not be used as the sole basis for treatment or other patient management decisions.  A negative result may occur with improper specimen collection / handling, submission of specimen other than nasopharyngeal swab, presence of viral mutation(s) within the areas targeted by this assay, and inadequate number of viral copies (<250 copies / mL). A negative result must be combined with clinical observations, patient history, and epidemiological information.  Fact Sheet for Patients:   StrictlyIdeas.no  Fact Sheet for Healthcare Providers: BankingDealers.co.za  This test is not yet approved or  cleared by the [place] FDA and has been authorized for detection and/or diagnosis of SARS-CoV-2 by FDA under an Emergency Use Authorization (EUA).  This EUA will remain in effect (meaning this test can  be used) for the duration of the COVID-19 declaration under Section 564(b)(1) of the Act, 21 U.S.C. section 360bbb-3(b)(1), unless the authorization is terminated or revoked sooner.  Performed at [facility], [place] [address] [place], [place] [address]   Surgical PCR screen     Status: Abnormal   Collection Time: 03/29/20  9:14 PM   Specimen: Nasal Mucosa; Nasal Swab  Result Value Ref Range Status   MRSA, PCR NEGATIVE NEGATIVE Final   Staphylococcus aureus POSITIVE (A) NEGATIVE Final    Comment: (NOTE) The Xpert SA Assay (FDA approved for NASAL specimens in patients 64 years of age and older), is one component of a comprehensive surveillance program. It is not intended to diagnose infection nor to guide or monitor treatment. Performed at [facility], [place] [address], [place], [place] [address]   Culture, blood (routine x 2)     Status: None (Preliminary result)    Collection Time: 03/30/20  1:22 PM   Specimen: BLOOD RIGHT HAND  Result Value Ref Range Status   Specimen Description   Final    BLOOD RIGHT HAND Performed at [facility] [address], [place], [place] [address]    Special Requests   Final    BOTTLES DRAWN AEROBIC ONLY Blood Culture adequate volume Performed at [facility] [address], [place], [place] [address]    Culture   Final    NO GROWTH 3 DAYS Performed at [facility], [place] [address] [place], [place] [address]    Report Status PENDING  Incomplete  Culture, blood (routine x 2)     Status: None (Preliminary result)   Collection Time: 03/30/20  1:22 PM   Specimen: BLOOD RIGHT HAND  Result Value Ref Range Status   Specimen Description   Final    BLOOD RIGHT HAND Performed at [facility] [address], [place], [place] [address]    Special Requests   Final    BOTTLES DRAWN AEROBIC ONLY Blood Culture adequate volume   Culture   Final    NO GROWTH 3 DAYS Performed at [facility], [place] [address] [place], [place] [address]    Report Status PENDING  Incomplete         Radiology Studies: DG CHEST PORT 1 VIEW  Result Date: 04/02/2020 CLINICAL DATA:  Fever. EXAM: PORTABLE CHEST 1 VIEW COMPARISON:  Chest radiograph 08/15/2018 and CT 01/24/2020 FINDINGS: The cardiac silhouette is mildly enlarged. There is mild elevation of the right hemidiaphragm. New hazy airspace opacity is present in the medial right lung base. Minimal opacity in the left mid lung may reflect atelectasis. No pleural effusion or pneumothorax is identified. No acute osseous abnormality is seen. IMPRESSION: Right basilar airspace opacity compatible with pneumonia. Electronically Signed   By: Logan Bores M.D.   On: 04/02/2020 10:55        Scheduled Meds:  amoxicillin-clavulanate  1 tablet Oral Q12H   flecainide  75 mg Oral Q12H   hydrocortisone cream   Topical QID    lidocaine  1 patch Transdermal Q24H   mupirocin ointment  1 application Nasal BID   rivaroxaban  20 mg Oral Q supper   sodium chloride flush  3 mL Intravenous Q12H   Continuous Infusions:    LOS: 5 days        Hosie Poisson, MD [facility]   To contact the attending provider between 7A-7P or the covering provider during after  hours 7P-7A, please log into the web site [email or web address] and access using universal [place] password for that web site. If you do not have the password, please call the hospital operator.  04/02/2020, 6:34 PM

## 2020-04-02 NOTE — Plan of Care (Signed)
  Problem: Education: Goal: Knowledge of General Education information will improve Description: Including pain rating scale, medication(s)/side effects and non-pharmacologic comfort measures Outcome: Progressing   Problem: Clinical Measurements: Goal: Will remain free from infection Outcome: Progressing   Problem: Activity: Goal: Risk for activity intolerance will decrease Outcome: Progressing   Problem: Pain Managment: Goal: General experience of comfort will improve Outcome: Progressing   Problem: Clinical Measurements: Goal: Postoperative complications will be avoided or minimized Outcome: Progressing

## 2020-04-03 DIAGNOSIS — K810 Acute cholecystitis: Secondary | ICD-10-CM | POA: Diagnosis not present

## 2020-04-03 LAB — BASIC METABOLIC PANEL
Anion gap: 11 (ref 5–15)
BUN: 11 mg/dL (ref 6–20)
CO2: 23 mmol/L (ref 22–32)
Calcium: 8.8 mg/dL — ABNORMAL LOW (ref 8.9–10.3)
Chloride: 107 mmol/L (ref 98–111)
Creatinine, Ser: 0.98 mg/dL (ref 0.61–1.24)
GFR, Estimated: >60 mL/min (ref >60)
Glucose, Bld: 102 mg/dL — ABNORMAL HIGH (ref 70–99)
Potassium: 3.9 mmol/L (ref 3.5–5.1)
Sodium: 141 mmol/L (ref 135–145)

## 2020-04-03 MED ORDER — HYDROCORTISONE 1 % EX CREA: TOPICAL_CREAM | Freq: Four times a day (QID) | CUTANEOUS | 0 refills | Status: DC

## 2020-04-03 MED ORDER — HYDROCODONE-ACETAMINOPHEN 5-325 MG PO TABS: 1.0000 | ORAL_TABLET | Freq: Four times a day (QID) | ORAL | 0 refills | Status: AC | PRN

## 2020-04-03 MED ORDER — AMOXICILLIN-POT CLAVULANATE 875-125 MG PO TABS: 1.0000 | ORAL_TABLET | Freq: Two times a day (BID) | ORAL | 0 refills | Status: AC

## 2020-04-03 NOTE — Progress Notes (Signed)
Patient and his spouse givn discharge, follow up,and medication instructions, verbalized understanding, IV and telemetry monitor removed, personal belongings with patient, family to transport home

## 2020-04-03 NOTE — Plan of Care (Signed)
  Problem: Clinical Measurements: Goal: Diagnostic test results will improve Outcome: Completed/Met

## 2020-04-03 NOTE — Discharge Summary (Signed)
Physician Discharge Summary  Travis Kaufman [id number] DOB: 24-May-1970 DOA: 03/28/2020  PCP: No primary care provider on file.  Admit date: 03/28/2020 Discharge date: 04/03/2020  Admitted From: Home.  Disposition: HOme.   Recommendations for Outpatient Follow-up:  1. Follow up with PCP in 1-2 weeks 2. Please obtain BMP/CBC in one week Please follow up with general surgery as recommended.  Please follow up with a repeat CXR in 3 to 4 weeks for evaluation of resolution of the pneumonia.   Discharge Condition: stable.  CODE STATUS:Full code.  Diet recommendation: Heart Healthy   Brief/Interim Summary: 50 year old gentleman prior history of atrial fibrillation s/p ablation on 01/31/2020 by Dr. Riki Sheer currently on flecainide and Xarelto, GERD s/p Nissen fundoplication presents to ED with right upper quadrant pain and flank pain associated with some nausea and dry heaving.  He was found to have and acute cholecystitis on right upper quadrant ultrasound. General surgery consulted and he underwent lap cholecystectomy with intraoperative cholangiography.   Discharge Diagnoses:  Active Problems:   Acute cholecystitis   Acute cholecystitis s/p lap cholecystectomy with intraoperative cholangiography Liver enzymes slowly improving.  Nausea vomiting has resolved abdominal pain has improved and days tolerable with oral Vicodin. Diet as tolerated at this time Patient is cleared for discharge from surgical standpoint.    Atrial fibrillation s/p ablation On flecainide and Xarelto for anticoagulation. Rate controlled, no changes in medication    GERD S/p fundoplication.    Dry heaving and nausea Have improved   Hypokalemia Replaced, magnesium is greater than 2.    Acute anemia of blood loss/anemia of acute illness Patient's hemoglobin at baseline appears to be between 13-15.  Dropped to 11, and has been stable around 11.6.   Fever/right basilar  pneumonia Chest x-ray showed right basilar pneumonia. Patient denies any chest pain or shortness of breath. IV Zosyn changed to Augmentin to complete the course for right-sided pneumonia. Patient denies any cough at this time. Continue with incentive spirometry. Ambulate as tolerated.   Blanchable erythematous rash on the back Suspect contact dermatitis Hydrocortisone cream to help with itching.   Discharge Instructions  Discharge Instructions    Diet - low sodium heart healthy   Complete by: As directed    Discharge instructions   Complete by: As directed    Please follow up with Dr Harlow Asa as recommended.  You will need a repeat CXR in 3 to 4 weeks for resolution of the pneumonia.   No wound care   Complete by: As directed      Allergies as of 04/03/2020      Reactions   Wasp Venom Anaphylaxis, Hives   Oxycodone Other (See Comments)   "wired & can't sleep"      Medication List    STOP taking these medications   diltiazem 30 MG tablet Commonly known as: Cardizem   EXCEDRIN PO   pantoprazole 40 MG tablet Commonly known as: Protonix     TAKE these medications   acetaminophen 500 MG tablet Commonly known as: TYLENOL Take 500-1,000 mg by mouth 2 (two) times daily as needed for moderate pain or headache.   ALPRAZolam 1 MG tablet Commonly known as: Xanax Take 0.5-1 tablets (0.5-1 mg total) by mouth 2 (two) times daily as needed for anxiety (30-60 min prior to flying). What changed: reasons to take this   amoxicillin-clavulanate 875-125 MG tablet Commonly known as: AUGMENTIN Take 1 tablet by mouth every 12 (twelve) hours for 5 days. Notes to patient: 04/03/20  B COMPLEX PO Take 1 tablet by mouth daily. Notes to patient: 04/04/20   BERBERINE COMPLEX PO Take 1 capsule by mouth in the morning and at bedtime. Notes to patient: 04/04/20   EPINEPHrine 0.3 mg/0.3 mL Soaj injection Commonly known as: EPI-PEN Inject 0.3 mLs (0.3 mg total) into the muscle as needed  for anaphylaxis. What changed: when to take this   flecainide 150 MG tablet Commonly known as: TAMBOCOR Take 0.5 tablets (75 mg total) by mouth every 12 (twelve) hours. Notes to patient: 04/03/20   HYDROcodone-acetaminophen 5-325 MG tablet Commonly known as: NORCO/VICODIN Take 1-2 tablets by mouth every 6 (six) hours as needed for up to 5 days for moderate pain.   hydrocortisone cream 1 % Apply topically 4 (four) times daily. Notes to patient: 04/03/20   JUICE PLUS FIBRE PO Take 8 tablets by mouth daily. Omega (2) + Berry Blend (2) + Vegetable Blend (2) + Fruit Blend (2) Notes to patient: 04/04/20   OVER THE COUNTER MEDICATION Take 1 tablet by mouth 3 (three) times daily. Gastro-fiber   PROBIOTIC PO Take 1 capsule by mouth daily. MegaSporeBiotic Notes to patient: 04/04/20   rivaroxaban 20 MG Tabs tablet Commonly known as: XARELTO Take 1 tablet (20 mg total) by mouth daily with supper. Notes to patient: 04/03/20       Follow-up [facility], [place]. Schedule an appointment as soon as possible for a visit in 1 week(s).   Specialty: General Surgery Why: our office is scheduling you for follow up from recent gallbladder surgery. please call to confirm appointment date/time. please order CXR in 3 weeks to evaluate for resolution of the pneumonia.  Contact information: [address] [place] [phone]       Sueanne Margarita, MD. Schedule an appointment as soon as possible for a visit in 2 week(s).   Specialty: Cardiology Why: please check CXR to evaluate for resolution of the pneumonia.  Contact information: [id number] [place] [address] [phone]        Thompson Grayer, MD .   Specialty: Cardiology Contact information: [address] [employer] [place] [address] [place]. Schedule an appointment as soon as possible for a  visit.   Why: Call to set up an appointment with a PCP. Contact information: [place] [phone]             Allergies  Allergen Reactions  . Wasp Venom Anaphylaxis and Hives  . Oxycodone Other (See Comments)    "wired & can't sleep"    Consultations:  Cardiology  general surgery.    Procedures/Studies: DG Cholangiogram Operative  Result Date: 03/30/2020 CLINICAL DATA:  Cholangiogram EXAM: INTRAOPERATIVE CHOLANGIOGRAM TECHNIQUE: Cholangiographic images from the C-arm fluoroscopic device were submitted for interpretation post-operatively. Please see the procedural report for the amount of contrast and the fluoroscopy time utilized. COMPARISON:  March 28, 2020 FINDINGS: Injection of contrast opacifies the biliary tree. There is no clear filling defect within the common bile duct. Contrast is seen flowing into the proximal small bowel. There is no biliary ductal dilatation. A cine clip and single image were provided for evaluation. The total fluoroscopy time was 52 seconds. The total dose was 17.06 mGy. IMPRESSION: Status post intraoperative cholangiogram without evidence for definite filling defect. Electronically Signed   By: Jamie Kato.D.  On: 03/30/2020 14:00   NM Hepatobiliary Liver Func  Result Date: 03/29/2020 CLINICAL DATA:  Evaluate for cholecystitis. EXAM: NUCLEAR MEDICINE HEPATOBILIARY IMAGING TECHNIQUE: Sequential images of the abdomen were obtained out to 60 minutes following intravenous administration of radiopharmaceutical. RADIOPHARMACEUTICALS:  4.95 mCi Tc-54m  Choletec IV COMPARISON:  Gallbladder sonogram from 03/28/2020. FINDINGS: Prompt uptake and biliary excretion of activity by the liver is seen. Biliary activity passes into small bowel, consistent with patent common bile duct. Imaging was performed for 120 minutes without visualization of the gallbladder. IMPRESSION: 1. Nonvisualization of the  gallbladder. Imaging findings are compatible with acute cholecystitis. 2. Patent common bile duct with normal biliary to bowel transit. Electronically Signed   By: Kerby Moors M.D.   On: 03/29/2020 11:26   DG CHEST PORT 1 VIEW  Result Date: 04/02/2020 CLINICAL DATA:  Fever. EXAM: PORTABLE CHEST 1 VIEW COMPARISON:  Chest radiograph 08/15/2018 and CT 01/24/2020 FINDINGS: The cardiac silhouette is mildly enlarged. There is mild elevation of the right hemidiaphragm. New hazy airspace opacity is present in the medial right lung base. Minimal opacity in the left mid lung may reflect atelectasis. No pleural effusion or pneumothorax is identified. No acute osseous abnormality is seen. IMPRESSION: Right basilar airspace opacity compatible with pneumonia. Electronically Signed   By: Logan Bores M.D.   On: 04/02/2020 10:55   CT Renal Stone Study  Result Date: 03/28/2020 CLINICAL DATA:  Right flank pain and right upper abdominal pain starting about midnight last night, worse with movement. EXAM: CT ABDOMEN AND PELVIS WITHOUT CONTRAST TECHNIQUE: Multidetector CT imaging of the abdomen and pelvis was performed following the standard protocol without IV contrast. COMPARISON:  Overlapping portion of cardiac CT from 01/24/2020 FINDINGS: Lower chest: Unremarkable Hepatobiliary: Abnormal gallbladder wall thickening and pericholecystic fluid. Accentuated density in the gallbladder could be from gallstones, sludge, or debris. No CBD or common hepatic duct dilatation. Noncontrast 0.6 by 0.3 cm calcification or calcific nodule along the posterior margin of the right hepatic lobe on image 74 of series 6, significance uncertain but probably chronic. Pancreas: Unremarkable Spleen: Unremarkable Adrenals/Urinary Tract: Unremarkable Stomach/Bowel: Postoperative findings from fundoplication. Normal appendix. Otherwise unremarkable. Vascular/Lymphatic: Mild aortoiliac atherosclerotic vascular calcification. No pathologic adenopathy  identified. Reproductive: Unremarkable Other: No supplemental non-categorized findings. Musculoskeletal: Lumbar spondylosis and degenerative disc disease. IMPRESSION: 1. Abnormal gallbladder wall thickening and pericholecystic fluid, suspicious for acute cholecystitis. Correlate with clinical presentation. Accentuated density in the gallbladder could be from gallstones, sludge, or debris. 2. Postoperative findings from fundoplication. 3. Lumbar spondylosis and degenerative disc disease. 4. Aortic atherosclerosis. Aortic Atherosclerosis (ICD10-I70.0). Electronically Signed   By: Van Clines M.D.   On: 03/28/2020 11:44   US Abdomen Limited RUQ (LIVER/GB)  Result Date: 03/28/2020 CLINICAL DATA:  Pain EXAM: ULTRASOUND ABDOMEN LIMITED RIGHT UPPER QUADRANT COMPARISON:  CT 03/28/2020 FINDINGS: Gallbladder: Sludge noted within the gallbladder lumen. There is mild gallbladder wall thickening with minimal pericholecystic fluid. Sonographic Murphy sign positive per technologist. Common bile duct: Diameter: 5 mm Liver: No focal lesion identified. Within normal limits in parenchymal echogenicity. Portal vein is patent on color Doppler imaging with normal direction of blood flow towards the liver. Other: None. IMPRESSION: Sonographic findings suspicious for acute cholecystitis. Electronically Signed   By: Miachel Roux M.D.   On: 03/28/2020 12:37       Subjective:  No new complaints.  Discharge Exam: Vitals:   04/02/20 1949 04/03/20 0544  BP: 109/72 116/68  Pulse: (!) 58 (!) 49  Resp: 18 18  Temp: 98.9  F (37.2 C) 99.2 F (37.3 C)  SpO2: 98% 97%   Vitals:   04/02/20 0811 04/02/20 1400 04/02/20 1949 04/03/20 0544  BP: 107/74 103/68 109/72 116/68  Pulse: (!) 50 (!) 54 (!) 58 (!) 49  Resp:  (!) 8 18 18   Temp: 99.4 F (37.4 C) 99.3 F (37.4 C) 98.9 F (37.2 C) 99.2 F (37.3 C)  TempSrc: Oral Oral Oral Oral  SpO2: 96% 98% 98% 97%  Weight:      Height:        General: Pt is alert, awake,  not in acute distress Cardiovascular: RRR, S1/S2 +, no rubs, no gallops Respiratory: CTA bilaterally, no wheezing, no rhonchi Abdominal: Soft, NT, ND, bowel sounds + Extremities: no edema, no cyanosis    The results of significant diagnostics from this hospitalization (including imaging, microbiology, ancillary and laboratory) are listed below for reference.     Microbiology: Recent Results (from the past 240 hour(s))  SARS Coronavirus 2 by RT PCR (hospital order, performed in [facility] hospital lab) Nasopharyngeal Nasopharyngeal Swab     Status: None   Collection Time: 03/28/20 12:07 PM   Specimen: Nasopharyngeal Swab  Result Value Ref Range Status   SARS Coronavirus 2 NEGATIVE NEGATIVE Final    Comment: (NOTE) SARS-CoV-2 target nucleic acids are NOT DETECTED.  The SARS-CoV-2 RNA is generally detectable in upper and lower respiratory specimens during the acute phase of infection. The lowest concentration of SARS-CoV-2 viral copies this assay can detect is 250 copies / mL. A negative result does not preclude SARS-CoV-2 infection and should not be used as the sole basis for treatment or other patient management decisions.  A negative result may occur with improper specimen collection / handling, submission of specimen other than nasopharyngeal swab, presence of viral mutation(s) within the areas targeted by this assay, and inadequate number of viral copies (<250 copies / mL). A negative result must be combined with clinical observations, patient history, and epidemiological information.  Fact Sheet for Patients:   StrictlyIdeas.no  Fact Sheet for Healthcare Providers: BankingDealers.co.za  This test is not yet approved or  cleared by the [place] FDA and has been authorized for detection and/or diagnosis of SARS-CoV-2 by FDA under an Emergency Use Authorization (EUA).  This EUA will remain in effect (meaning this test can  be used) for the duration of the COVID-19 declaration under Section 564(b)(1) of the Act, 21 U.S.C. section 360bbb-3(b)(1), unless the authorization is terminated or revoked sooner.  Performed at [facility], [place] [address] [place], [place] [address]   Surgical PCR screen     Status: Abnormal   Collection Time: 03/29/20  9:14 PM   Specimen: Nasal Mucosa; Nasal Swab  Result Value Ref Range Status   MRSA, PCR NEGATIVE NEGATIVE Final   Staphylococcus aureus POSITIVE (A) NEGATIVE Final    Comment: (NOTE) The Xpert SA Assay (FDA approved for NASAL specimens in patients 81 years of age and older), is one component of a comprehensive surveillance program. It is not intended to diagnose infection nor to guide or monitor treatment. Performed at [facility], [place] [address] [place], [place] [address]   Culture, blood (routine x 2)     Status: None (Preliminary result)   Collection Time: 03/30/20  1:22 PM   Specimen: BLOOD RIGHT HAND  Result Value Ref Range Status   Specimen Description   Final    BLOOD RIGHT HAND Performed at [facility] [address]  [place], [place] [address]    Special Requests   Final    BOTTLES DRAWN AEROBIC ONLY Blood Culture adequate volume Performed at [facility] [address] [place], [place] [address]    Culture   Final    NO GROWTH 4 DAYS Performed at [facility], [place] [address] [place], [place] [address]    Report Status PENDING  Incomplete  Culture, blood (routine x 2)     Status: None (Preliminary result)   Collection Time: 03/30/20  1:22 PM   Specimen: BLOOD RIGHT HAND  Result Value Ref Range Status   Specimen Description   Final    BLOOD RIGHT HAND Performed at [facility] [address], [place], [place] [address]    Special Requests   Final    BOTTLES DRAWN AEROBIC ONLY Blood Culture adequate volume   Culture   Final    NO  GROWTH 4 DAYS Performed at [facility], [place] [address] [place], [place] [address]    Report Status PENDING  Incomplete  Culture, blood (Routine X 2) w Reflex to ID Panel     Status: None (Preliminary result)   Collection Time: 04/02/20  9:02 AM   Specimen: BLOOD  Result Value Ref Range Status   Specimen Description   Final    BLOOD RIGHT ANTECUBITAL Performed at [facility] [address], [place], [place] [address]    Special Requests   Final    BOTTLES DRAWN AEROBIC AND ANAEROBIC Blood Culture adequate volume Performed at [facility] [address] [place], [place] [address]    Culture   Final    NO GROWTH 1 DAY Performed at [facility], [place] [address] [place], [place] [address]    Report Status PENDING  Incomplete  Culture, blood (Routine X 2) w Reflex to ID Panel     Status: None (Preliminary result)   Collection Time: 04/02/20  9:07 AM   Specimen: BLOOD RIGHT HAND  Result Value Ref Range Status   Specimen Description   Final    BLOOD RIGHT HAND Performed at [facility] [address], [place], [place] [address]    Special Requests   Final    BOTTLES DRAWN AEROBIC AND ANAEROBIC Blood Culture adequate volume Performed at [facility] [address], [place], [place] [address]    Culture   Final    NO GROWTH 1 DAY Performed at [facility], [place] [address], [place], [place] [address]    Report Status PENDING  Incomplete     Labs: BNP (last 3 results) No results for input(s): BNP in the last 8760 hours. Basic Metabolic Panel: Recent Labs  Lab 03/30/20 0601 03/31/20 0800 04/01/20 0628 04/02/20 0521 04/03/20 0538  NA 137 139 141 141 141  K 3.3* 3.8 3.3* 4.0 3.9  CL 103 102 105 105 107  CO2 23 28 28 26 23   GLUCOSE 102* 118* 120* 96 102*  BUN 8 5* 6 11 11   CREATININE 1.15 0.97 1.07 1.29* 0.98  CALCIUM 8.4* 8.7* 8.4* 8.7* 8.8*  MG  --   --  2.1  --   --     Liver Function Tests: Recent Labs  Lab 03/29/20 0512 03/30/20 0601 03/31/20 0800 04/01/20 0628 04/02/20 0521  AST 126* 115* 98* 47* 35  ALT 154* 164* 160* 106* 85*  ALKPHOS 89 166* 157* 115 123  BILITOT 1.3* 1.1  0.5 0.4 0.4  PROT 6.1* 6.1* 6.6 5.7* 6.3*  ALBUMIN 3.6 3.3* 3.4* 3.0* 3.1*   Recent Labs  Lab 03/28/20 1238  LIPASE 20   No results for input(s): AMMONIA in the last 168 hours. CBC: Recent Labs  Lab 03/29/20 0512 03/30/20 0601 03/31/20 0800 04/01/20 0628 04/02/20 0521  WBC 14.5* 13.4* 12.8* 10.9* 10.6*  HGB 13.7 13.0 12.5* 11.0* 11.6*  HCT 40.3 38.9* 37.1* 33.2* 35.1*  MCV 96.2 96.8 97.6 97.4 97.2  PLT 256 235 241 261 285   Cardiac Enzymes: No results for input(s): CKTOTAL, CKMB, CKMBINDEX, TROPONINI in the last 168 hours. BNP: Invalid input(s): POCBNP CBG: No results for input(s): GLUCAP in the last 168 hours. D-Dimer No results for input(s): DDIMER in the last 72 hours. Hgb A1c No results for input(s): HGBA1C in the last 72 hours. Lipid Profile No results for input(s): CHOL, HDL, LDLCALC, TRIG, CHOLHDL, LDLDIRECT in the last 72 hours. Thyroid function studies No results for input(s): TSH, T4TOTAL, T3FREE, THYROIDAB in the last 72 hours.  Invalid input(s): FREET3 Anemia work up No results for input(s): VITAMINB12, FOLATE, FERRITIN, TIBC, IRON, RETICCTPCT in the last 72 hours. Urinalysis    Component Value Date/Time   COLORURINE YELLOW 03/28/2020 1150   APPEARANCEUR CLEAR 03/28/2020 1150   LABSPEC 1.026 03/28/2020 1150   PHURINE 6.0 03/28/2020 1150   GLUCOSEU NEGATIVE 03/28/2020 1150   HGBUR NEGATIVE 03/28/2020 [place] 03/28/2020 1150   KETONESUR 80 (A) 03/28/2020 [place] 03/28/2020 1150   NITRITE NEGATIVE 03/28/2020 [place] 03/28/2020 1150   Sepsis Labs Invalid input(s): PROCALCITONIN,  WBC,  LACTICIDVEN Microbiology Recent Results (from the past 240 hour(s))  SARS  Coronavirus 2 by RT PCR (hospital order, performed in [facility] hospital lab) Nasopharyngeal Nasopharyngeal Swab     Status: None   Collection Time: 03/28/20 12:07 PM   Specimen: Nasopharyngeal Swab  Result Value Ref Range Status   SARS Coronavirus 2 NEGATIVE NEGATIVE Final    Comment: (NOTE) SARS-CoV-2 target nucleic acids are NOT DETECTED.  The SARS-CoV-2 RNA is generally detectable in upper and lower respiratory specimens during the acute phase of infection. The lowest concentration of SARS-CoV-2 viral copies this assay can detect is 250 copies / mL. A negative result does not preclude SARS-CoV-2 infection and should not be used as the sole basis for treatment or other patient management decisions.  A negative result may occur with improper specimen collection / handling, submission of specimen other than nasopharyngeal swab, presence of viral mutation(s) within the areas targeted by this assay, and inadequate number of viral copies (<250 copies / mL). A negative result must be combined with clinical observations, patient history, and epidemiological information.  Fact Sheet for Patients:   StrictlyIdeas.no  Fact Sheet for Healthcare Providers: BankingDealers.co.za  This test is not yet approved or  cleared by the [place] FDA and has been authorized for detection and/or diagnosis of SARS-CoV-2 by FDA under an Emergency Use Authorization (EUA).  This EUA will remain in effect (meaning this test can be used) for the duration of the COVID-19 declaration under Section 564(b)(1) of the Act, 21 U.S.C. section 360bbb-3(b)(1), unless the authorization is terminated or revoked sooner.  Performed at [facility], [place] [address] [place], [place] [address]   Surgical PCR screen     Status: Abnormal   Collection Time: 03/29/20  9:14 PM   Specimen: Nasal Mucosa; Nasal Swab  Result Value Ref Range  Status   MRSA,  PCR NEGATIVE NEGATIVE Final   Staphylococcus aureus POSITIVE (A) NEGATIVE Final    Comment: (NOTE) The Xpert SA Assay (FDA approved for NASAL specimens in patients 9 years of age and older), is one component of a comprehensive surveillance program. It is not intended to diagnose infection nor to guide or monitor treatment. Performed at [facility], [place] [address], [place], [place] [address]   Culture, blood (routine x 2)     Status: None (Preliminary result)   Collection Time: 03/30/20  1:22 PM   Specimen: BLOOD RIGHT HAND  Result Value Ref Range Status   Specimen Description   Final    BLOOD RIGHT HAND Performed at [place] [address], [place], [place] [address]    Special Requests   Final    BOTTLES DRAWN AEROBIC ONLY Blood Culture adequate volume Performed at [facility] [address], [place], [place] [address]    Culture   Final    NO GROWTH 4 DAYS Performed at [facility], [place] [address], [place], [place] [address]    Report Status PENDING  Incomplete  Culture, blood (routine x 2)     Status: None (Preliminary result)   Collection Time: 03/30/20  1:22 PM   Specimen: BLOOD RIGHT HAND  Result Value Ref Range Status   Specimen Description   Final    BLOOD RIGHT HAND Performed at [place] [address], [place], [place] [address]    Special Requests   Final    BOTTLES DRAWN AEROBIC ONLY Blood Culture adequate volume   Culture   Final    NO GROWTH 4 DAYS Performed at [facility], [place] [address] [place], [place] [address]    Report Status PENDING  Incomplete  Culture, blood (Routine X 2) w Reflex to ID Panel     Status: None (Preliminary result)   Collection Time: 04/02/20  9:02 AM   Specimen: BLOOD  Result Value Ref Range Status   Specimen Description   Final    BLOOD RIGHT ANTECUBITAL Performed at [facility] [address],  [place], [place] [address]    Special Requests   Final    BOTTLES DRAWN AEROBIC AND ANAEROBIC Blood Culture adequate volume Performed at [facility] [address], [place], [place] [address]    Culture   Final    NO GROWTH 1 DAY Performed at [facility], [place] [address], [place], [place] [address]    Report Status PENDING  Incomplete  Culture, blood (Routine X 2) w Reflex to ID Panel     Status: None (Preliminary result)   Collection Time: 04/02/20  9:07 AM   Specimen: BLOOD RIGHT HAND  Result Value Ref Range Status   Specimen Description   Final    BLOOD RIGHT HAND Performed at [place] [address], [place], [place] [address]    Special Requests   Final    BOTTLES DRAWN AEROBIC AND ANAEROBIC Blood Culture adequate volume Performed at [place] [address] [place], [place] [address]    Culture   Final    NO GROWTH 1 DAY Performed at [facility], [place] [address] [place],  [address]    Report Status PENDING  Incomplete     Time coordinating discharge: 36 minutes.   SIGNED:   Hosie Poisson, MD  [facility]

## 2020-04-04 ENCOUNTER — Telehealth: Payer: Self-pay | Admitting: Internal Medicine

## 2020-04-04 LAB — CULTURE, BLOOD (ROUTINE X 2)
Culture: NO GROWTH
Culture: NO GROWTH
Special Requests: ADEQUATE
Special Requests: ADEQUATE

## 2020-04-04 NOTE — Telephone Encounter (Signed)
Pt c/o medication issue:  1. Name of Medication: aleve  2. How are you currently taking this medication (dosage and times per day)? Has not started  3. Are you having a reaction (difficulty breathing--STAT)? no  4. What is your medication issue? Patient's wife states the patient had to have emergency gallbladder surgery and was told by a nurse at the hospital he could take aleve for the pain. She states the bottle states to not take it if you are on blood thinners and the patient takes xarelto. Please advise.

## 2020-04-04 NOTE — Telephone Encounter (Signed)
Spoke with wife.  Patient currently on hydrocodone/acetaminophen.  Wants to know if he can have Aleve.  Advised that it can increase bleeding risk so if he takes it to monitor for signs and symptoms of bleeding (urine/stool/etc). However acetaminophen would be safer and since he is trying to get off the Vicodin, the transition to Tylenol would be easy.  Wife voiced understanding.

## 2020-04-04 NOTE — Telephone Encounter (Signed)
Patient's wife is following up, pt is waiting to take the aleve for pain and inflammation.

## 2020-04-07 LAB — CULTURE, BLOOD (ROUTINE X 2)
Culture: NO GROWTH
Culture: NO GROWTH
Special Requests: ADEQUATE
Special Requests: ADEQUATE

## 2020-05-07 ENCOUNTER — Ambulatory Visit (INDEPENDENT_AMBULATORY_CARE_PROVIDER_SITE_OTHER): Payer: 59 | Admitting: Internal Medicine

## 2020-05-07 ENCOUNTER — Other Ambulatory Visit: Payer: Self-pay

## 2020-05-07 ENCOUNTER — Encounter: Payer: Self-pay | Admitting: Internal Medicine

## 2020-05-07 VITALS — BP 102/64 | HR 53 | Ht 71.0 in | Wt 174.0 lb

## 2020-05-07 DIAGNOSIS — I480 Paroxysmal atrial fibrillation: Secondary | ICD-10-CM | POA: Diagnosis not present

## 2020-05-07 NOTE — Patient Instructions (Signed)
Medication Instructions:  Your physician has recommended you make the following change in your medication:   STOP: Xarelto  *If you need a refill on your cardiac medications before your next appointment, please call your pharmacy*   Lab Work: None  If you have labs (blood work) drawn today and your tests are completely normal, you will receive your results only by: Marland Kitchen MyChart Message (if you have MyChart) OR . A paper copy in the mail If you have any lab test that is abnormal or we need to change your treatment, we will call you to review the results.   Testing/Procedures: None   Follow-Up: At [facility], you and your health needs are our priority.  As part of our continuing mission to provide you with exceptional heart care, we have created designated Provider Care Teams.  These Care Teams include your primary Cardiologist (physician) and Advanced Practice Providers (APPs -  Physician Assistants and Nurse Practitioners) who all work together to provide you with the care you need, when you need it.  We recommend signing up for the patient portal called "MyChart".  Sign up information is provided on this After Visit Summary.  MyChart is used to connect with patients for Virtual Visits (Telemedicine).  Patients are able to view lab/test results, encounter notes, upcoming appointments, etc.  Non-urgent messages can be sent to your provider as well.   To learn more about what you can do with MyChart, go to [email or web address].    Your next appointment:   3 month(s)  The format for your next appointment:   In Person  Provider:   Thompson Grayer, MD   Other Instructions NOne

## 2020-05-07 NOTE — Progress Notes (Signed)
PCP: Patient, [name]   Travis Kaufman is a 50 y.o. male who presents today for routine electrophysiology followup.  Since his recent afib ablation, the patient reports doing very well.  he denies procedure related complications and is pleased with the results of the procedure.  He did have acute cholecystitis in January for which he had surgery.  He did not have afib with this.  Today, he denies symptoms of palpitations, chest pain, shortness of breath,  lower extremity edema, dizziness, presyncope, or syncope.  The patient is otherwise without complaint today.   Past Medical History:  Diagnosis Date  . Coronary artery disease-no disease by cardiac cath 08/2018   . GERD (gastroesophageal reflux disease)   . PAF (paroxysmal atrial fibrillation) ([place])    Past Surgical History:  Procedure Laterality Date  . ATRIAL FIBRILLATION ABLATION N/A 01/31/2020   Procedure: ATRIAL FIBRILLATION ABLATION;  Surgeon: Thompson Grayer, MD;  Location: [place] [facility];  Service: Cardiovascular;  Laterality: N/A;  . BIOPSY  08/17/2018   Procedure: BIOPSY;  Surgeon: Ronnette Juniper, MD;  Location: [place];  Service: Gastroenterology;;  . CHOLECYSTECTOMY N/A 03/30/2020   Procedure: LAPAROSCOPIC CHOLECYSTECTOMY WITH INTRAOPERATIVE CHOLANGIOGRAM;  Surgeon: Armandina Gemma, MD;  [facility];  Service: General;  Laterality: N/A;  . ESOPHAGEAL MANOMETRY N/A 09/24/2018   Procedure: ESOPHAGEAL MANOMETRY (EM);  Surgeon: Ronnette Juniper, MD;  [facility];  Service: Gastroenterology;  Laterality: N/A;  . ESOPHAGOGASTRODUODENOSCOPY (EGD) WITH PROPOFOL N/A 08/17/2018   Procedure: ESOPHAGOGASTRODUODENOSCOPY (EGD) WITH PROPOFOL;  Surgeon: Ronnette Juniper, MD;  Location: [place];  Service: Gastroenterology;  Laterality: N/A;  . LEFT HEART CATH AND CORONARY ANGIOGRAPHY N/A 08/17/2018   Procedure: LEFT HEART CATH AND CORONARY ANGIOGRAPHY;  Surgeon: Burnell Blanks, MD;  Location: [place] [facility];  Service:  Cardiovascular;  Laterality: N/A;  . NISSEN FUNDOPLICATION      ROS- all systems are personally reviewed and negatives except as per HPI above  Current Outpatient Medications  Medication Sig Dispense Refill  . acetaminophen (TYLENOL) 500 MG tablet Take 500-1,000 mg by mouth 2 (two) times daily as needed for moderate pain or headache.     . ALPRAZolam (XANAX) 1 MG tablet Take 0.5-1 tablets (0.5-1 mg total) by mouth 2 (two) times daily as needed for anxiety (30-60 min prior to flying). 10 tablet 0  . B Complex Vitamins (B COMPLEX PO) Take 1 tablet by mouth daily.    Jolyne Loa Grape-Goldenseal (BERBERINE COMPLEX PO) Take 1 capsule by mouth in the morning and at bedtime.    Marland Kitchen EPINEPHrine 0.3 mg/0.3 mL IJ SOAJ injection Inject 0.3 mLs (0.3 mg total) into the muscle as needed for anaphylaxis. 1 each 5  . flecainide (TAMBOCOR) 150 MG tablet Take 0.5 tablets (75 mg total) by mouth every 12 (twelve) hours. 30 tablet 3  . hydrocortisone cream 1 % Apply topically 4 (four) times daily. 30 g 0  . Nutritional Supplements (JUICE PLUS FIBRE PO) Take 8 tablets by mouth daily. Omega (2) + Berry Blend (2) + Vegetable Blend (2) + Fruit Blend (2)    . OVER THE COUNTER MEDICATION Take 1 tablet by mouth 3 (three) times daily. Gastro-fiber    . Probiotic Product (PROBIOTIC PO) Take 1 capsule by mouth daily. MegaSporeBiotic    . rivaroxaban (XARELTO) 20 MG TABS tablet Take 1 tablet (20 mg total) by mouth daily with supper. 90 tablet 3   No current facility-administered medications for this visit.   Facility-Administered Medications  Ordered in Other Visits  Medication Dose Route Frequency Provider Last Rate Last Admin  . 0.9 %  sodium chloride infusion   Intravenous Continuous Ronnette Juniper, MD        Physical Exam: Vitals:   05/07/20 1003  BP: 102/64  Pulse: (!) 53  SpO2: 96%  Weight: 174 lb (78.9 kg)  Height: 5\' 11"  (1.803 m)    GEN- The patient is well appearing, alert and oriented x 3 today.    Head- normocephalic, atraumatic Eyes-  Sclera clear, conjunctiva pink Ears- hearing intact Oropharynx- clear Lungs- Clear to ausculation bilaterally, normal work of breathing Heart- Regular rate and rhythm, no murmurs, rubs or gallops, PMI not laterally displaced GI- soft, NT, ND, + BS Extremities- no clubbing, cyanosis, or edema  EKG tracing ordered today is personally reviewed and shows sinus bradycardia  Assessment and Plan:  1. Paroxysmal atrial fibrillation Doing well s/p ablation chads2vasc score is 0 Stop [place] at this time We discussed stopping flecainide.  He is reluctant to stop it but will consider weaning over the next 3 months  Return to see me in 3 months  Thompson Grayer MD, [facility] 05/07/2020 10:19 AM

## 2020-05-31 ENCOUNTER — Other Ambulatory Visit: Payer: Self-pay | Admitting: Physician Assistant

## 2020-07-17 IMAGING — RF DG ESOPHAGUS
13 series · 14 of 24 positions shown · non-contrast
Comparison: None.

CLINICAL DATA: This inflammation

EXAM:
ESOPHOGRAM/BARIUM SWALLOW
TECHNIQUE: Single contrast examination was performed using 3 minutes 6 seconds.
FLUOROSCOPY TIME:  Fluoroscopy Time:  3 minutes 6 seconds
Radiation Exposure Index (if provided by the fluoroscopic device):
92 mGy
Number of Acquired Spot Images: 0

[Series 1: fluoro_barium 2fps_bw · 0.18mm/px · 1 of 2 frames shown (1 of 13)]
[frame 1/2]
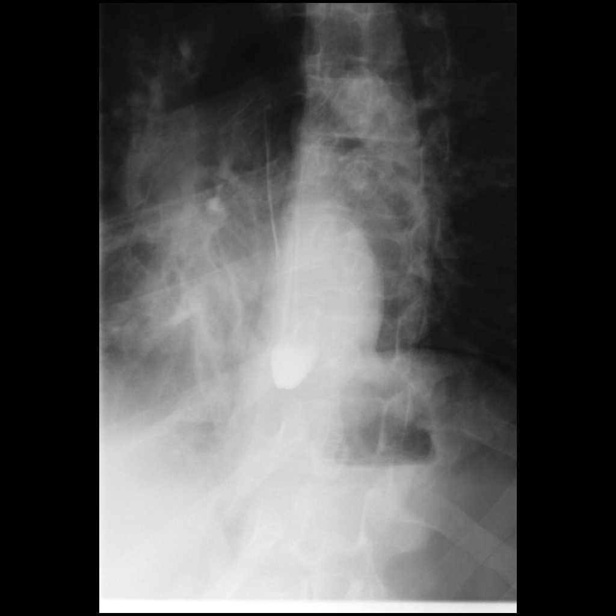

[Series 2: fluoro_barium 2fps_bw · 0.18mm/px · 1 of 2 frames shown (2 of 13)]
[frame 1/2]
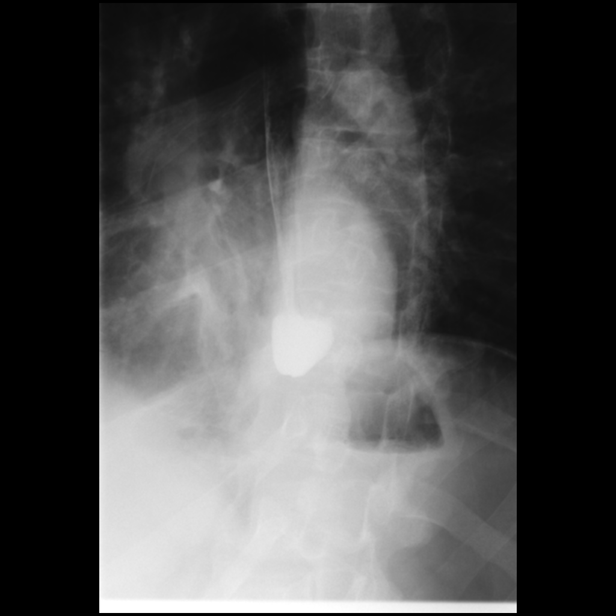

[Series 3: fluoro_barium 2fps_bw · 0.18mm/px · 1 of 1 slices shown (3 of 13)]
[im 1/1]
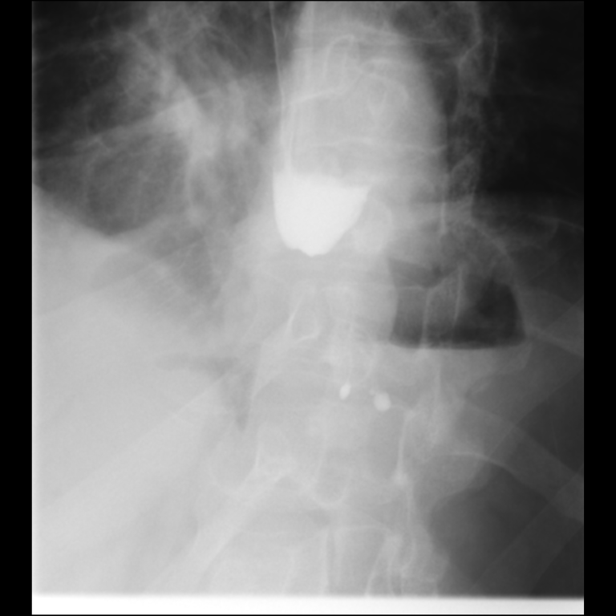

[Series 4: fluoro_barium 2fps_bw · 0.18mm/px · 1 of 2 frames shown (4 of 13)]
[frame 2/2]
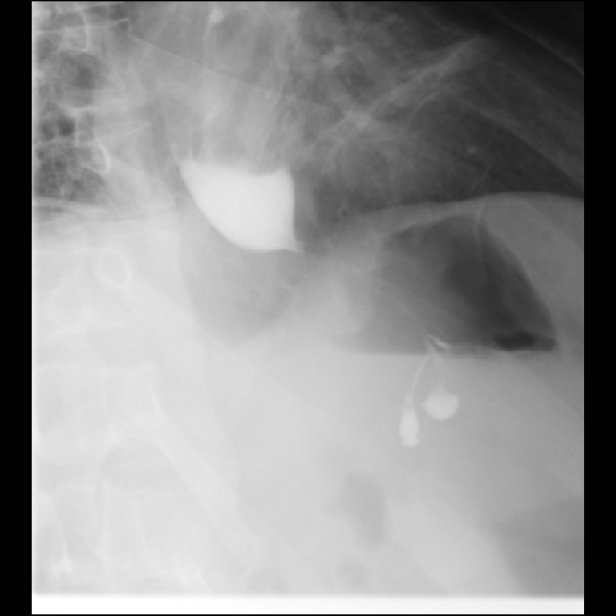

[Series 5: fluoro_barium 2fps_bw · 0.18mm/px · 1 of 1 slices shown (5 of 13)]
[im 1/1]
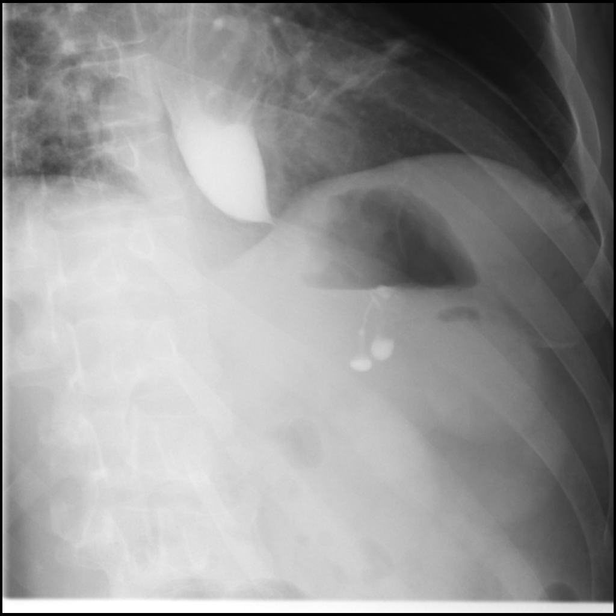

[Series 6: fluoro_barium 2fps_bw · 0.18mm/px · 1 of 2 frames shown (6 of 13)]
[frame 2/2]
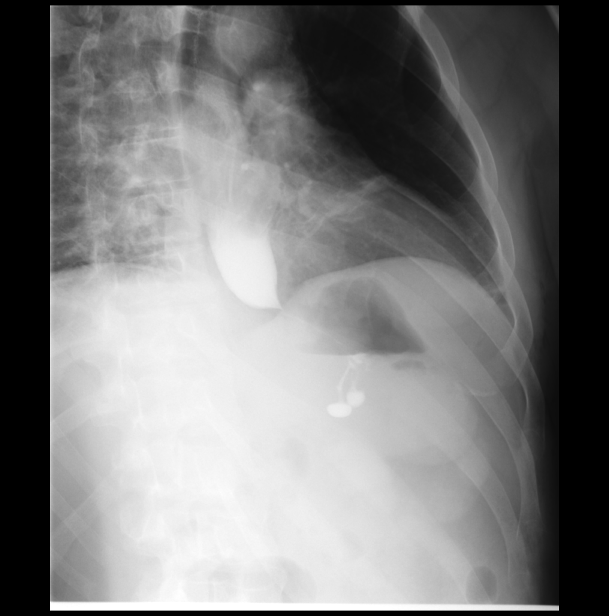

[Series 7: fluoro_barium 2fps_bw · 0.18mm/px · 1 of 2 frames shown (7 of 13)]
[frame 2/2]
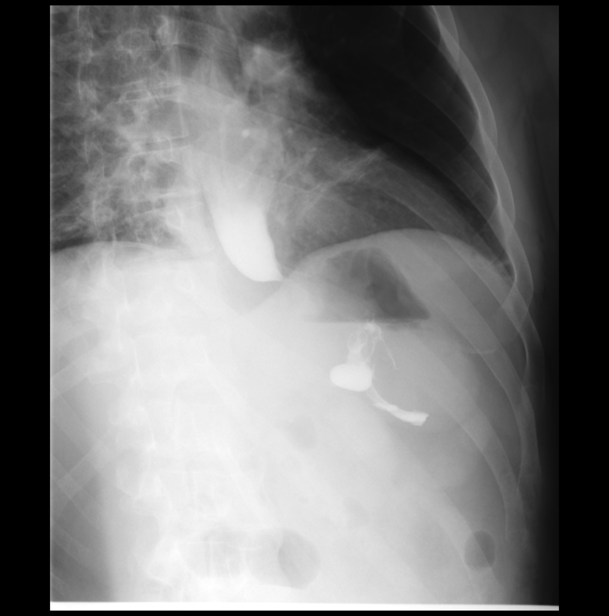

[Series 8: fluoro_barium 2fps_bw · 0.18mm/px · 1 of 2 frames shown (8 of 13)]
[frame 2/2]
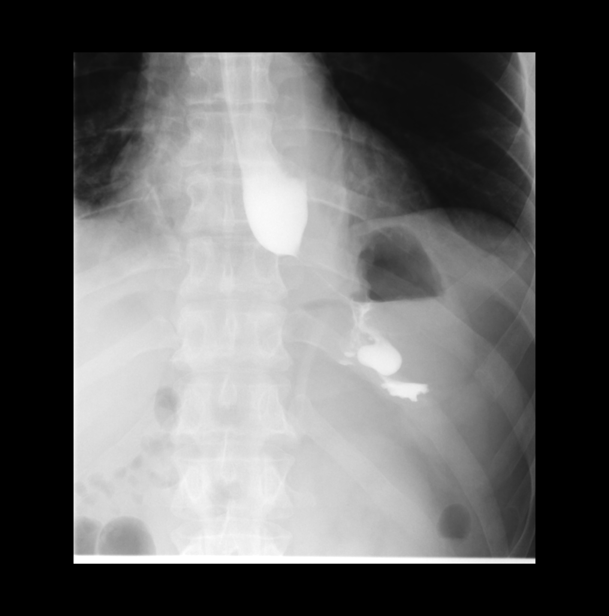

[Series 9: fluoro_barium 2fps_bw · 0.18mm/px · 1 of 2 frames shown (9 of 13)]
[frame 2/2]
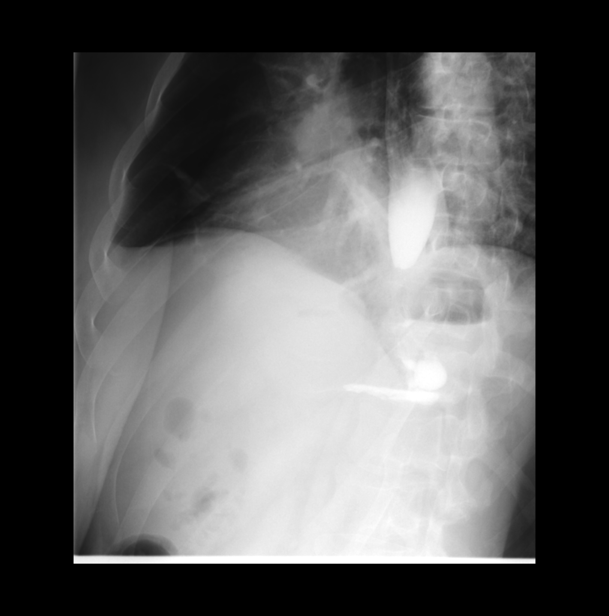

[Series 10: fluoro_barium 2fps_bw · 0.18mm/px · 1 of 3 frames shown (10 of 13)]
[frame 2/3]
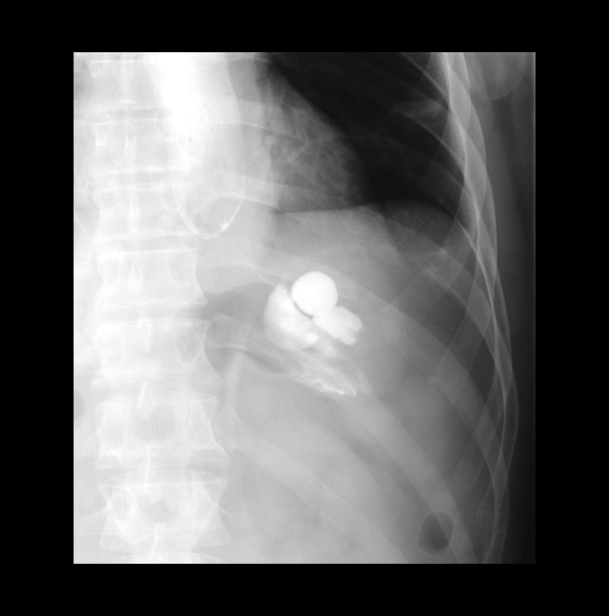

[Series 11: fluoro_barium 2fps_bw · 0.18mm/px · 2 of 2 frames shown (11 of 13)]
[frame 1/2]
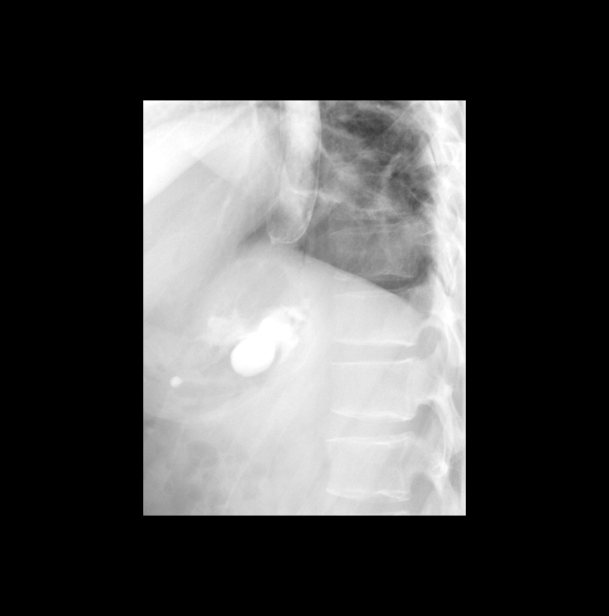
[frame 2/2]
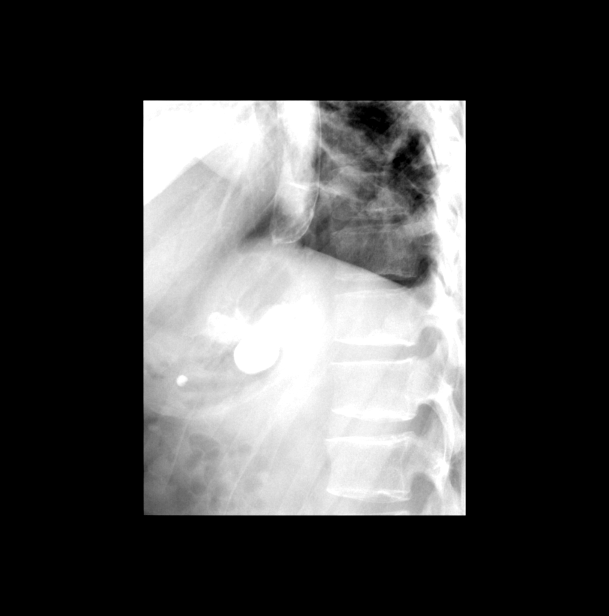

[Series 12: fluoro_barium 2fps_bw · 0.18mm/px · 1 of 2 frames shown (12 of 13)]
[frame 2/2]
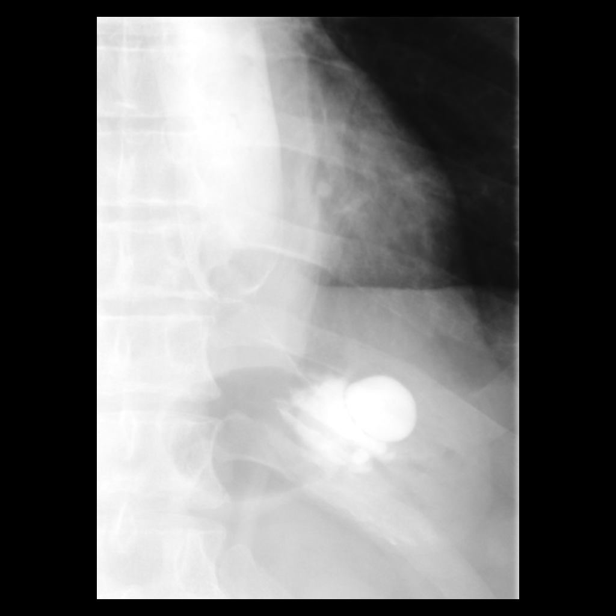

[Series 13: fluoro_barium 2fps_bw · 0.18mm/px · 1 of 2 frames shown (13 of 13)]
[frame 2/2]
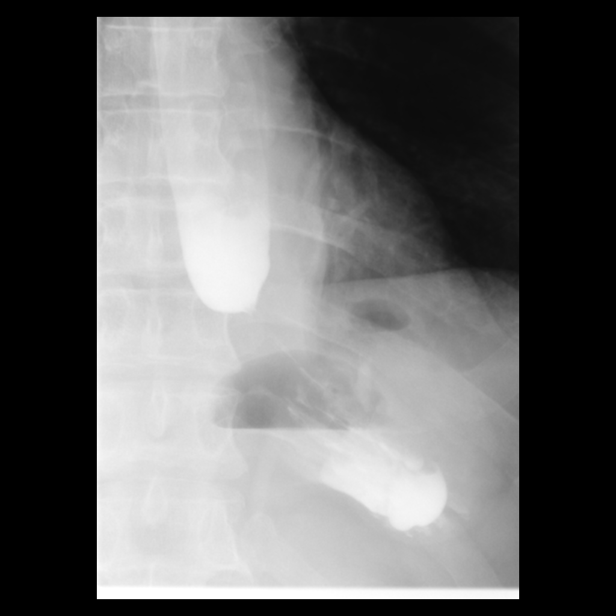

[14 of 24 positions shown; findings below may reference images not displayed]

FINDINGS: Contrast slowly readily through the esophagus to the GE junction.
There is a tight stricturing at the GE junction. This stricturing
occurs over approximately 3.5 cm. The thin line of contrast
traverses this stricturing into the stomach. This small volume
contrast pools within the stomach.
IMPRESSION: 1. Tight stricture through the GE junction over 3.5 cm segment.
2. Small volume contrast passed the segment of narrowing to enter
the stomach.
3. No evidence of leak.

## 2020-08-09 ENCOUNTER — Ambulatory Visit: Payer: 59 | Admitting: Internal Medicine

## 2020-09-10 ENCOUNTER — Ambulatory Visit: Payer: 59 | Admitting: Emergency Medicine

## 2020-09-26 ENCOUNTER — Other Ambulatory Visit: Payer: Self-pay

## 2020-09-26 ENCOUNTER — Encounter: Payer: Self-pay | Admitting: Internal Medicine

## 2020-09-26 ENCOUNTER — Ambulatory Visit (INDEPENDENT_AMBULATORY_CARE_PROVIDER_SITE_OTHER): Payer: 59 | Admitting: Internal Medicine

## 2020-09-26 VITALS — BP 110/68 | HR 56 | Ht 71.0 in | Wt 175.2 lb

## 2020-09-26 DIAGNOSIS — I480 Paroxysmal atrial fibrillation: Secondary | ICD-10-CM

## 2020-09-26 NOTE — Progress Notes (Signed)
PCP: Patient, [name] (Inactive)   Primary EP: Dr Rayann Heman  Travis Kaufman is a 50 y.o. male who presents today for routine electrophysiology followup.  Since last being seen in our clinic, the patient reports doing very well.  Today, he denies symptoms of palpitations, chest pain, shortness of breath,  lower extremity edema, dizziness, presyncope, or syncope.  The patient is otherwise without complaint today.   Past Medical History:  Diagnosis Date   Coronary artery disease-no disease by cardiac cath 08/2018    GERD (gastroesophageal reflux disease)    PAF (paroxysmal atrial fibrillation) ([place])    Past Surgical History:  Procedure Laterality Date   ATRIAL FIBRILLATION ABLATION N/A 01/31/2020   Procedure: ATRIAL FIBRILLATION ABLATION;  Surgeon: Thompson Grayer, MD;  Location: [place] [facility];  Service: Cardiovascular;  Laterality: N/A;   BIOPSY  08/17/2018   Procedure: BIOPSY;  Surgeon: Ronnette Juniper, MD;  Location: [place];  Service: Gastroenterology;;   CHOLECYSTECTOMY N/A 03/30/2020   Procedure: LAPAROSCOPIC CHOLECYSTECTOMY WITH INTRAOPERATIVE CHOLANGIOGRAM;  Surgeon: Armandina Gemma, MD;  [facility];  Service: General;  Laterality: N/A;   ESOPHAGEAL MANOMETRY N/A 09/24/2018   Procedure: ESOPHAGEAL MANOMETRY (EM);  Surgeon: Ronnette Juniper, MD;  [facility];  Service: Gastroenterology;  Laterality: N/A;   ESOPHAGOGASTRODUODENOSCOPY (EGD) WITH PROPOFOL N/A 08/17/2018   Procedure: ESOPHAGOGASTRODUODENOSCOPY (EGD) WITH PROPOFOL;  Surgeon: Ronnette Juniper, MD;  Location: [place];  Service: Gastroenterology;  Laterality: N/A;   LEFT HEART CATH AND CORONARY ANGIOGRAPHY N/A 08/17/2018   Procedure: LEFT HEART CATH AND CORONARY ANGIOGRAPHY;  Surgeon: Burnell Blanks, MD;  Location: [place] [facility];  Service: Cardiovascular;  Laterality: N/A;   NISSEN FUNDOPLICATION      ROS- all systems are reviewed and negatives except as per HPI above  Current Outpatient  Medications  Medication Sig Dispense Refill   ALPRAZolam (XANAX) 1 MG tablet Take 0.5-1 tablets (0.5-1 mg total) by mouth 2 (two) times daily as needed for anxiety (30-60 min prior to flying). 10 tablet 0   B Complex Vitamins (B COMPLEX PO) Take 1 tablet by mouth daily.     colestipol (COLESTID) 1 g tablet Take 2 g by mouth daily.     EPINEPHrine 0.3 mg/0.3 mL IJ SOAJ injection Inject 0.3 mLs (0.3 mg total) into the muscle as needed for anaphylaxis. 1 each 5   flecainide (TAMBOCOR) 150 MG tablet TAKE 1/2 TABLET (75 MG TOTAL) BY MOUTH EVERY 12 (TWELVE) HOURS. 90 tablet 3   Nutritional Supplements (JUICE PLUS FIBRE PO) Take 8 tablets by mouth daily. Omega (2) + Berry Blend (2) + Vegetable Blend (2) + Fruit Blend (2)     sucralfate (CARAFATE) 1 g tablet Take 1 g by mouth 2 (two) times daily.     No current facility-administered medications for this visit.   Facility-Administered Medications Ordered in Other Visits  Medication Dose Route Frequency Provider Last Rate Last Admin   0.9 %  sodium chloride infusion   Intravenous Continuous Ronnette Juniper, MD        Physical Exam: Vitals:   09/26/20 1220  BP: 110/68  Pulse: (!) 56  SpO2: 96%  Weight: 175 lb 3.2 oz (79.5 kg)  Height: '5\' 11"'$  (1.803 m)    GEN- The patient is well appearing, alert and oriented x 3 today.   Head- normocephalic, atraumatic Eyes-  Sclera clear, conjunctiva pink Ears- hearing intact Oropharynx- clear Lungs- Clear to ausculation bilaterally, normal work of breathing Heart- Regular rate and rhythm,  no murmurs, rubs or gallops, PMI not laterally displaced GI- soft, NT, ND, + BS Extremities- no clubbing, cyanosis, or edema  Wt Readings from Last 3 Encounters:  09/26/20 175 lb 3.2 oz (79.5 kg)  05/07/20 174 lb (78.9 kg)  03/30/20 175 lb (79.4 kg)    EKG tracing ordered today is personally reviewed and shows sinus rhythm  Assessment and Plan:  Paroxysmal atrial fibrillation Doing well post ablation He continues  to slowly wean flecainide (currently taking '75mg'$  qam).  I have advised that he continue to consider stopping flecainide and switching to pill in pocket approach only. Chads2vasc score is 0.  Does not require [place]  Risks, benefits and potential toxicities for medications prescribed and/or refilled reviewed with patient today.   Return in 9 months  Thompson Grayer MD, [facility] 09/26/2020 12:40 PM

## 2020-09-26 NOTE — Patient Instructions (Addendum)
Medication Instructions:  Your physician recommends that you continue on your current medications as directed. Please refer to the Current Medication list given to you today.  Labwork: None ordered.  Testing/Procedures: None ordered.  Follow-Up: Your physician wants you to follow-up in: 07/01/21 at 9:45 am with Thompson Grayer, MD   Any Other Special Instructions Will Be Listed Below (If Applicable).  If you need a refill on your cardiac medications before your next appointment, please call your pharmacy.

## 2020-11-26 ENCOUNTER — Ambulatory Visit: Payer: 59 | Admitting: Emergency Medicine

## 2021-03-07 ENCOUNTER — Ambulatory Visit: Payer: 59 | Admitting: Emergency Medicine

## 2021-03-28 ENCOUNTER — Other Ambulatory Visit: Payer: Self-pay | Admitting: Physician Assistant

## 2021-07-01 ENCOUNTER — Ambulatory Visit: Payer: 59 | Admitting: Internal Medicine

## 2021-08-28 ENCOUNTER — Other Ambulatory Visit: Payer: Self-pay | Admitting: Internal Medicine

## 2021-11-11 DIAGNOSIS — Z86010 Personal history of colonic polyps: Secondary | ICD-10-CM | POA: Diagnosis not present

## 2021-11-11 DIAGNOSIS — K219 Gastro-esophageal reflux disease without esophagitis: Secondary | ICD-10-CM | POA: Diagnosis not present

## 2021-11-11 DIAGNOSIS — K9089 Other intestinal malabsorption: Secondary | ICD-10-CM | POA: Diagnosis not present

## 2021-12-21 DIAGNOSIS — R059 Cough, unspecified: Secondary | ICD-10-CM | POA: Diagnosis not present

## 2021-12-21 DIAGNOSIS — R0789 Other chest pain: Secondary | ICD-10-CM | POA: Diagnosis not present

## 2021-12-21 DIAGNOSIS — R0602 Shortness of breath: Secondary | ICD-10-CM | POA: Diagnosis not present

## 2022-03-19 ENCOUNTER — Emergency Department (HOSPITAL_COMMUNITY): Payer: 59

## 2022-03-19 ENCOUNTER — Emergency Department (HOSPITAL_COMMUNITY)
Admission: EM | Admit: 2022-03-19 | Discharge: 2022-03-19 | Discharge status: Against Medical Advice | Payer: 59 | Attending: Student | Admitting: Student

## 2022-03-19 ENCOUNTER — Other Ambulatory Visit: Payer: Self-pay

## 2022-03-19 DIAGNOSIS — Z5321 Procedure and treatment not carried out due to patient leaving prior to being seen by health care provider: Secondary | ICD-10-CM | POA: Diagnosis not present

## 2022-03-19 DIAGNOSIS — I10 Essential (primary) hypertension: Secondary | ICD-10-CM | POA: Diagnosis not present

## 2022-03-19 DIAGNOSIS — R509 Fever, unspecified: Secondary | ICD-10-CM | POA: Diagnosis not present

## 2022-03-19 DIAGNOSIS — R0789 Other chest pain: Secondary | ICD-10-CM | POA: Diagnosis not present

## 2022-03-19 DIAGNOSIS — R000 Tachycardia, unspecified: Secondary | ICD-10-CM | POA: Diagnosis not present

## 2022-03-19 DIAGNOSIS — R0689 Other abnormalities of breathing: Secondary | ICD-10-CM | POA: Diagnosis not present

## 2022-03-19 DIAGNOSIS — R0602 Shortness of breath: Secondary | ICD-10-CM | POA: Diagnosis not present

## 2022-03-19 DIAGNOSIS — U071 COVID-19: Secondary | ICD-10-CM | POA: Diagnosis not present

## 2022-03-19 DIAGNOSIS — R202 Paresthesia of skin: Secondary | ICD-10-CM | POA: Diagnosis not present

## 2022-03-19 LAB — BASIC METABOLIC PANEL
Anion gap: 13 (ref 5–15)
BUN: 8 mg/dL (ref 6–20)
CO2: 22 mmol/L (ref 22–32)
Calcium: 9.1 mg/dL (ref 8.9–10.3)
Chloride: 100 mmol/L (ref 98–111)
Creatinine, Ser: 1.03 mg/dL (ref 0.61–1.24)
GFR, Estimated: >60 mL/min (ref >60)
Glucose, Bld: 98 mg/dL (ref 70–99)
Potassium: 3.7 mmol/L (ref 3.5–5.1)
Sodium: 135 mmol/L (ref 135–145)

## 2022-03-19 LAB — CBC WITH DIFFERENTIAL/PLATELET
Abs Immature Granulocytes: 0.03 10*3/uL (ref 0.00–0.07)
Basophils Absolute: 0 10*3/uL (ref 0.0–0.1)
Basophils Relative: 0 %
Eosinophils Absolute: 0.1 10*3/uL (ref 0.0–0.5)
Eosinophils Relative: 1 %
HCT: 44.3 % (ref 39.0–52.0)
Hemoglobin: 15.1 g/dL (ref 13.0–17.0)
Immature Granulocytes: 0 %
Lymphocytes Relative: 3 %
Lymphs Abs: 0.3 10*3/uL — ABNORMAL LOW (ref 0.7–4.0)
MCH: 32.1 pg (ref 26.0–34.0)
MCHC: 34.1 g/dL (ref 30.0–36.0)
MCV: 94.3 fL (ref 80.0–100.0)
Monocytes Absolute: 0.9 10*3/uL (ref 0.1–1.0)
Monocytes Relative: 11 %
Neutro Abs: 6.4 10*3/uL (ref 1.7–7.7)
Neutrophils Relative %: 85 %
Platelets: 253 10*3/uL (ref 150–400)
RBC: 4.7 MIL/uL (ref 4.22–5.81)
RDW: 13.3 % (ref 11.5–15.5)
WBC: 7.7 10*3/uL (ref 4.0–10.5)
nRBC: 0 % (ref 0.0–0.2)

## 2022-03-19 MED ORDER — ACETAMINOPHEN 325 MG PO TABS: 650.0000 mg | ORAL_TABLET | Freq: Once | ORAL | Status: DC

## 2022-03-19 NOTE — ED Triage Notes (Signed)
EMS stated, he test positive for COVID this morning around 0900. Pt has had some fever and this morning had some chest pain with SOB , very anxious when we arrived.

## 2022-03-19 NOTE — ED Provider Triage Note (Signed)
Emergency Medicine Provider Triage Evaluation Note  Travis Kaufman , a 52 y.o. male  was evaluated in triage.  Patient presenting with fever, chest pain, shortness of breath, body aches and other viral symptoms.  These have been going on since last night, tested positive for COVID this morning.  Reports his wife also has COVID.  Also says his feet are numb  Review of Systems  Positive:  Negative:   Physical Exam  BP 111/81   Pulse 86   Temp (!) 100.7 F (38.2 C)   Resp 16   Ht '5\' 11"'$  (1.803 m)   Wt 79.4 kg   SpO2 99%   BMI 24.41 kg/m  Gen:   Awake, no distress   Resp:  Normal effort  MSK:   Moves extremities without difficulty  Other:  Weak on physical exam.  Generalized, no focal weakness.  Anxious  Medical Decision Making  Medically screening exam initiated at 10:26 AM.  Appropriate orders placed.  Claudie Leach Saxton was informed that the remainder of the evaluation will be completed by another provider, this initial triage assessment does not replace that evaluation, and the importance of remaining in the ED until their evaluation is complete.    Patient does not appear to have any signs of acute stroke despite complaining of numbness in his feet.  Likely all secondary to COVID and not feeling well but will do a workup from triage   Jemmie Rhinehart, [place], PA-C 03/19/22 1027

## 2022-03-19 NOTE — ED Notes (Signed)
Pt requested to have iv taking out so that him and hus family can leave

## 2022-03-24 ENCOUNTER — Other Ambulatory Visit: Payer: Self-pay | Admitting: Internal Medicine

## 2022-04-07 DIAGNOSIS — Z7689 Persons encountering health services in other specified circumstances: Secondary | ICD-10-CM | POA: Diagnosis not present

## 2022-05-15 ENCOUNTER — Other Ambulatory Visit: Payer: Self-pay | Admitting: Student

## 2022-06-30 DIAGNOSIS — E639 Nutritional deficiency, unspecified: Secondary | ICD-10-CM | POA: Diagnosis not present

## 2022-06-30 DIAGNOSIS — K3184 Gastroparesis: Secondary | ICD-10-CM | POA: Diagnosis not present

## 2022-06-30 DIAGNOSIS — K2100 Gastro-esophageal reflux disease with esophagitis, without bleeding: Secondary | ICD-10-CM | POA: Diagnosis not present

## 2022-08-13 ENCOUNTER — Other Ambulatory Visit: Payer: Self-pay | Admitting: Student

## 2022-09-26 DIAGNOSIS — E639 Nutritional deficiency, unspecified: Secondary | ICD-10-CM | POA: Diagnosis not present

## 2022-09-26 DIAGNOSIS — K2100 Gastro-esophageal reflux disease with esophagitis, without bleeding: Secondary | ICD-10-CM | POA: Diagnosis not present

## 2022-09-26 DIAGNOSIS — K3184 Gastroparesis: Secondary | ICD-10-CM | POA: Diagnosis not present

## 2022-11-19 ENCOUNTER — Encounter: Payer: Self-pay | Admitting: Family Medicine

## 2022-11-19 ENCOUNTER — Ambulatory Visit: Payer: 59 | Admitting: Family Medicine

## 2022-11-19 VITALS — BP 124/76 | HR 74 | Temp 97.6°F | Wt 177.4 lb

## 2022-11-19 DIAGNOSIS — I480 Paroxysmal atrial fibrillation: Secondary | ICD-10-CM

## 2022-11-19 DIAGNOSIS — F418 Other specified anxiety disorders: Secondary | ICD-10-CM | POA: Insufficient documentation

## 2022-11-19 DIAGNOSIS — Z91038 Other insect allergy status: Secondary | ICD-10-CM | POA: Diagnosis not present

## 2022-11-19 DIAGNOSIS — K219 Gastro-esophageal reflux disease without esophagitis: Secondary | ICD-10-CM | POA: Diagnosis not present

## 2022-11-19 MED ORDER — EPINEPHRINE 0.3 MG/0.3ML IJ SOAJ: 0.3000 mg | INTRAMUSCULAR | 5 refills | Status: AC | PRN

## 2022-11-19 MED ORDER — HYDROXYZINE HCL 10 MG PO TABS: 10.0000 mg | ORAL_TABLET | Freq: Every day | ORAL | 0 refills | Status: DC | PRN

## 2022-11-19 NOTE — Assessment & Plan Note (Signed)
Stable post-ablation. No recent episodes noted.  Plan: Encourage regular follow-up with cardiology. Monitor for any new symptoms such as palpitations or chest pain.

## 2022-11-19 NOTE — Assessment & Plan Note (Signed)
The patient has a long history of severe anxiety related to flying due to a traumatic childhood incident. Previously managed with Xanax.  Plan: Trial of Hydroxyzine 10 mg at night to assess tolerability. If ineffective, consider limited use of Xanax (0.25 mg as needed). Educate patient on the risks associated with benzodiazepines.
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
## 2022-11-19 NOTE — Assessment & Plan Note (Signed)
Persistent symptoms post-Nissen fundoplication. Patient is under specialist management.  Plan: Continue current GERD management as prescribed by Dr. Charna Archer. Discuss the new medication, Vonoprazan, as a potential option with the gastroenterologist.

## 2022-11-19 NOTE — Assessment & Plan Note (Signed)
Refill EpiPen. Educate patient on the use of EpiPen during allergic reactions.

## 2022-12-06 ENCOUNTER — Encounter: Payer: Self-pay | Admitting: Family Medicine

## 2022-12-06 DIAGNOSIS — F418 Other specified anxiety disorders: Secondary | ICD-10-CM

## 2022-12-08 MED ORDER — ALPRAZOLAM 0.5 MG PO TABS: 0.5000 mg | ORAL_TABLET | Freq: Every day | ORAL | 0 refills | Status: DC | PRN

## 2022-12-08 MED ORDER — ALPRAZOLAM 0.5 MG PO TABS: 0.5000 mg | ORAL_TABLET | Freq: Every day | ORAL | 0 refills | Status: AC | PRN

## 2022-12-10 DIAGNOSIS — K3184 Gastroparesis: Secondary | ICD-10-CM | POA: Diagnosis not present

## 2022-12-10 DIAGNOSIS — E639 Nutritional deficiency, unspecified: Secondary | ICD-10-CM | POA: Diagnosis not present

## 2022-12-10 DIAGNOSIS — K2100 Gastro-esophageal reflux disease with esophagitis, without bleeding: Secondary | ICD-10-CM | POA: Diagnosis not present

## 2022-12-18 DIAGNOSIS — F4320 Adjustment disorder, unspecified: Secondary | ICD-10-CM | POA: Diagnosis not present

## 2022-12-24 DIAGNOSIS — F4320 Adjustment disorder, unspecified: Secondary | ICD-10-CM | POA: Diagnosis not present

## 2022-12-29 ENCOUNTER — Other Ambulatory Visit: Payer: Self-pay | Admitting: Family Medicine

## 2022-12-29 DIAGNOSIS — F418 Other specified anxiety disorders: Secondary | ICD-10-CM

## 2023-01-20 DIAGNOSIS — F4320 Adjustment disorder, unspecified: Secondary | ICD-10-CM | POA: Diagnosis not present

## 2023-02-20 ENCOUNTER — Other Ambulatory Visit: Payer: Self-pay | Admitting: Family Medicine

## 2023-02-20 DIAGNOSIS — F418 Other specified anxiety disorders: Secondary | ICD-10-CM

## 2023-02-20 NOTE — Telephone Encounter (Signed)
Pt advised via mychart message that appt is needed for future refills.

## 2023-03-12 DIAGNOSIS — F4320 Adjustment disorder, unspecified: Secondary | ICD-10-CM | POA: Diagnosis not present

## 2023-03-24 DIAGNOSIS — K3184 Gastroparesis: Secondary | ICD-10-CM | POA: Diagnosis not present

## 2023-03-24 DIAGNOSIS — K2100 Gastro-esophageal reflux disease with esophagitis, without bleeding: Secondary | ICD-10-CM | POA: Diagnosis not present

## 2023-03-24 DIAGNOSIS — E639 Nutritional deficiency, unspecified: Secondary | ICD-10-CM | POA: Diagnosis not present

## 2023-05-04 DIAGNOSIS — E559 Vitamin D deficiency, unspecified: Secondary | ICD-10-CM | POA: Diagnosis not present

## 2023-05-04 DIAGNOSIS — F40243 Fear of flying: Secondary | ICD-10-CM | POA: Diagnosis not present

## 2023-05-04 DIAGNOSIS — E785 Hyperlipidemia, unspecified: Secondary | ICD-10-CM | POA: Diagnosis not present

## 2023-11-30 DIAGNOSIS — Z09 Encounter for follow-up examination after completed treatment for conditions other than malignant neoplasm: Secondary | ICD-10-CM | POA: Diagnosis not present

## 2023-11-30 DIAGNOSIS — Z860101 Personal history of adenomatous and serrated colon polyps: Secondary | ICD-10-CM | POA: Diagnosis not present

## 2023-11-30 DIAGNOSIS — D122 Benign neoplasm of ascending colon: Secondary | ICD-10-CM | POA: Diagnosis not present

## 2023-11-30 DIAGNOSIS — K648 Other hemorrhoids: Secondary | ICD-10-CM | POA: Diagnosis not present

## 2023-11-30 DIAGNOSIS — D128 Benign neoplasm of rectum: Secondary | ICD-10-CM | POA: Diagnosis not present

## 2023-12-03 DIAGNOSIS — F419 Anxiety disorder, unspecified: Secondary | ICD-10-CM | POA: Diagnosis not present

## 2023-12-03 DIAGNOSIS — L821 Other seborrheic keratosis: Secondary | ICD-10-CM | POA: Diagnosis not present

## 2023-12-03 DIAGNOSIS — Z0001 Encounter for general adult medical examination with abnormal findings: Secondary | ICD-10-CM | POA: Diagnosis not present

## 2023-12-03 DIAGNOSIS — K219 Gastro-esophageal reflux disease without esophagitis: Secondary | ICD-10-CM | POA: Diagnosis not present

## 2023-12-03 DIAGNOSIS — I480 Paroxysmal atrial fibrillation: Secondary | ICD-10-CM | POA: Diagnosis not present

## 2024-02-22 DIAGNOSIS — Z0000 Encounter for general adult medical examination without abnormal findings: Secondary | ICD-10-CM | POA: Diagnosis not present

## 2024-02-22 DIAGNOSIS — E559 Vitamin D deficiency, unspecified: Secondary | ICD-10-CM | POA: Diagnosis not present

## 2024-03-23 ENCOUNTER — Emergency Department (HOSPITAL_BASED_OUTPATIENT_CLINIC_OR_DEPARTMENT_OTHER)

## 2024-03-23 ENCOUNTER — Other Ambulatory Visit: Payer: Self-pay

## 2024-03-23 ENCOUNTER — Encounter (HOSPITAL_BASED_OUTPATIENT_CLINIC_OR_DEPARTMENT_OTHER): Payer: Self-pay | Admitting: Emergency Medicine

## 2024-03-23 ENCOUNTER — Emergency Department (HOSPITAL_BASED_OUTPATIENT_CLINIC_OR_DEPARTMENT_OTHER)
Admission: EM | Admit: 2024-03-23 | Discharge: 2024-03-24 | Disposition: A | Discharge status: Home / Self Care | Attending: Emergency Medicine | Admitting: Emergency Medicine

## 2024-03-23 DIAGNOSIS — R109 Unspecified abdominal pain: Secondary | ICD-10-CM | POA: Insufficient documentation

## 2024-03-23 DIAGNOSIS — I482 Chronic atrial fibrillation, unspecified: Secondary | ICD-10-CM | POA: Diagnosis not present

## 2024-03-23 DIAGNOSIS — R072 Precordial pain: Secondary | ICD-10-CM | POA: Insufficient documentation

## 2024-03-23 DIAGNOSIS — R110 Nausea: Secondary | ICD-10-CM | POA: Insufficient documentation

## 2024-03-23 LAB — CBC
HCT: 46.8 % (ref 39.0–52.0)
Hemoglobin: 16.7 g/dL (ref 13.0–17.0)
MCH: 33 pg (ref 26.0–34.0)
MCHC: 35.7 g/dL (ref 30.0–36.0)
MCV: 92.5 fL (ref 80.0–100.0)
Platelets: 334 K/uL (ref 150–400)
RBC: 5.06 MIL/uL (ref 4.22–5.81)
RDW: 12.9 % (ref 11.5–15.5)
WBC: 7.6 K/uL (ref 4.0–10.5)
nRBC: 0 % (ref 0.0–0.2)

## 2024-03-23 LAB — LIPASE, BLOOD: Lipase: 59 U/L — ABNORMAL HIGH (ref 11–51)

## 2024-03-23 LAB — HEPATIC FUNCTION PANEL
ALT: 18 U/L (ref 0–44)
AST: 25 U/L (ref 15–41)
Albumin: 4.1 g/dL (ref 3.5–5.0)
Alkaline Phosphatase: 73 U/L (ref 38–126)
Bilirubin, Direct: 0.1 mg/dL (ref 0.0–0.2)
Indirect Bilirubin: 0.2 mg/dL — ABNORMAL LOW (ref 0.3–0.9)
Total Bilirubin: 0.4 mg/dL (ref 0.0–1.2)
Total Protein: 7.1 g/dL (ref 6.5–8.1)

## 2024-03-23 LAB — TROPONIN T, HIGH SENSITIVITY
Troponin T High Sensitivity: 6 ng/L (ref 0–19)
Troponin T High Sensitivity: 9 ng/L (ref 0–19)

## 2024-03-23 LAB — BASIC METABOLIC PANEL WITH GFR
Anion gap: 17 — ABNORMAL HIGH (ref 5–15)
BUN: 7 mg/dL (ref 6–20)
CO2: 21 mmol/L — ABNORMAL LOW (ref 22–32)
Calcium: 10 mg/dL (ref 8.9–10.3)
Chloride: 100 mmol/L (ref 98–111)
Creatinine, Ser: 0.99 mg/dL (ref 0.61–1.24)
GFR, Estimated: >60 mL/min
Glucose, Bld: 117 mg/dL — ABNORMAL HIGH (ref 70–99)
Potassium: 3.7 mmol/L (ref 3.5–5.1)
Sodium: 138 mmol/L (ref 135–145)

## 2024-03-23 MED ORDER — ONDANSETRON HCL 4 MG/2ML IJ SOLN
4.0000 mg | Freq: Once | INTRAMUSCULAR | Status: AC
  Administered 2024-03-24: 4 mg via INTRAVENOUS
  Filled 2024-03-23: qty 2

## 2024-03-23 MED ORDER — LIDOCAINE VISCOUS HCL 2 % MT SOLN
15.0000 mL | Freq: Once | OROMUCOSAL | Status: AC
  Administered 2024-03-23: 15 mL via OROMUCOSAL
  Filled 2024-03-23: qty 15

## 2024-03-23 MED ORDER — HYDROMORPHONE HCL 1 MG/ML IJ SOLN
0.5000 mg | Freq: Once | INTRAMUSCULAR | Status: AC
  Administered 2024-03-23: 0.5 mg via INTRAVENOUS
  Filled 2024-03-23: qty 1

## 2024-03-23 MED ORDER — ONDANSETRON HCL 4 MG/2ML IJ SOLN
4.0000 mg | Freq: Once | INTRAMUSCULAR | Status: AC
  Administered 2024-03-23: 4 mg via INTRAVENOUS
  Filled 2024-03-23: qty 2

## 2024-03-23 MED ORDER — IOHEXOL 300 MG/ML  SOLN
75.0000 mL | Freq: Once | INTRAMUSCULAR | Status: AC | PRN
  Administered 2024-03-23: 75 mL via INTRAVENOUS

## 2024-03-23 MED ORDER — LACTATED RINGERS IV BOLUS
1000.0000 mL | Freq: Once | INTRAVENOUS | Status: AC
  Administered 2024-03-23: 1000 mL via INTRAVENOUS

## 2024-03-23 MED ORDER — PANTOPRAZOLE SODIUM 40 MG IV SOLR
40.0000 mg | Freq: Once | INTRAVENOUS | Status: AC
  Administered 2024-03-23: 40 mg via INTRAVENOUS
  Filled 2024-03-23: qty 10

## 2024-03-23 MED ORDER — ALUM & MAG HYDROXIDE-SIMETH 200-200-20 MG/5ML PO SUSP
30.0000 mL | Freq: Once | ORAL | Status: AC
  Administered 2024-03-23: 30 mL via ORAL
  Filled 2024-03-23: qty 30

## 2024-03-23 NOTE — ED Triage Notes (Signed)
 Pt c/o mid chest pain x 2 days. + nausea.

## 2024-03-23 NOTE — ED Notes (Signed)
 Patient transported to X-ray

## 2024-03-23 NOTE — ED Notes (Signed)
 CCMD has been notified that the patient is on the cardiac monitor.

## 2024-03-23 NOTE — ED Provider Notes (Signed)
 "  EMERGENCY DEPARTMENT AT [facility] Provider Note   CSN: [id number] Arrival date & time: 03/23/24  1806     Patient presents with: Chest Pain   Travis Kaufman is a 54 y.o. male.  {Add pertinent medical, surgical, social history, OB history to YEP:67052} Patient with history of negative cardiac cath in June 2020, Nissen fundoplication surgery in September 2020, history of atrial fibrillation not currently on anticoagulation, history of cholecystectomy --presents to the emergency department today for evaluation of chest burning with severe nausea and heaving.   Patient heaves until he brings up foam.  He feels a pressure and bloated sensation in the upper abdomen.  Associated arm tingling. No fever.  No shortness of breath.  He is not currently on PPI.  Takes Carafate daily.  He has been able to keep down water.  Also takes colestipol to prevent diarrhea, he states, related to cholecystectomy.  Last bowel movement prior to arrival, no bleeding.       Prior to Admission medications  Medication Sig Start Date End Date Taking? Authorizing Provider  ALPRAZolam  (XANAX ) 0.5 MG tablet Take 1 tablet (0.5 mg total) by mouth daily as needed for anxiety. 12/08/22   Sebastian Beverley NOVAK, MD  B Complex Vitamins (B COMPLEX PO) Take 1 tablet by mouth daily.    [provider]  colestipol (COLESTID) 1 g tablet Take 2 g by mouth daily. 09/18/20   [provider]  EPINEPHrine  0.3 mg/0.3 mL IJ SOAJ injection Inject 0.3 mg into the muscle as needed for anaphylaxis. 11/19/22   Sebastian Beverley NOVAK, MD  hydrOXYzine  (ATARAX ) 10 MG tablet TAKE 1 TABLET BY MOUTH DAILY AS NEEDED FOR ANXIETY. 12/30/22   Sebastian Beverley NOVAK, MD  sucralfate (CARAFATE) 1 g tablet Take 1 g by mouth 2 (two) times daily. 09/18/20   [provider]    Allergies: Wasp venom and Oxycodone     Review of Systems  Updated Vital Signs BP (!) 146/98   Pulse 98   Temp 99.5 F (37.5 C) (Oral)   Resp 19    Ht 5' 11 (1.803 m)   Wt 80.7 kg   SpO2 99%   BMI 24.83 kg/m   Physical Exam Vitals and nursing note reviewed.  Constitutional:      General: He is in acute distress.     Appearance: He is well-developed.     Comments: Somewhat uncomfortable, heaving towards end of exam.  HENT:     Head: Normocephalic and atraumatic.  Eyes:     General:        Right eye: No discharge.        Left eye: No discharge.     Conjunctiva/sclera: Conjunctivae normal.  Cardiovascular:     Rate and Rhythm: Normal rate and regular rhythm.     Heart sounds: Normal heart sounds.  Pulmonary:     Effort: Pulmonary effort is normal.     Breath sounds: Normal breath sounds.  Abdominal:     Palpations: Abdomen is soft.     Tenderness: There is no abdominal tenderness.     Comments: Patient with heaving, multiple times, brings up foamy sputum.  Musculoskeletal:     Cervical back: Normal range of motion and neck supple.  Skin:    General: Skin is warm and dry.  Neurological:     Mental Status: He is alert.     (all labs ordered are listed, but only abnormal results are displayed) Labs Reviewed  BASIC METABOLIC  PANEL WITH GFR - Abnormal; Notable for the following components:      Result Value   CO2 21 (*)    Glucose, Bld 117 (*)    Anion gap 17 (*)    All other components within normal limits  HEPATIC FUNCTION PANEL - Abnormal; Notable for the following components:   Indirect Bilirubin 0.2 (*)    All other components within normal limits  LIPASE, BLOOD - Abnormal; Notable for the following components:   Lipase 59 (*)    All other components within normal limits  CBC  TROPONIN T, HIGH SENSITIVITY  TROPONIN T, HIGH SENSITIVITY    EKG: None  Radiology: DG Chest 2 View Result Date: 03/23/2024 EXAM: 2 VIEW(S) XRAY OF THE CHEST 03/23/2024 06:26:00 PM COMPARISON: Comparison chest x ray 03/09/2022. CLINICAL HISTORY: chest pain FINDINGS: LUNGS AND PLEURA: No focal pulmonary opacity. No pleural  effusion. No pneumothorax. HEART AND MEDIASTINUM: No acute abnormality of the cardiac and mediastinal silhouettes. BONES AND SOFT TISSUES: No acute osseous abnormality. IMPRESSION: 1. No acute cardiopulmonary abnormality. Electronically signed by: Greig Pique MD 03/23/2024 06:38 PM EST RP Workstation: [id number]    {Document cardiac monitor, telemetry assessment procedure when appropriate:32947} Procedures   Medications Ordered in the ED  pantoprazole  (PROTONIX ) injection 40 mg (has no administration in time range)  ondansetron  (ZOFRAN ) injection 4 mg (has no administration in time range)  alum & mag hydroxide-simeth (MAALOX/MYLANTA) 200-200-20 MG/5ML suspension 30 mL (has no administration in time range)  lidocaine  (XYLOCAINE ) 2 % viscous mouth solution 15 mL (has no administration in time range)  lactated ringers  bolus 1,000 mL (has no administration in time range)   ED Course  Patient seen and examined. History obtained directly from patient.  Reviewed previous cardiac catheterization, previous surgical history.  Labs/EKG: CBC, BMP, troponin ordered in triage.  I have added hepatic function panel and lipase.  Imaging: Chest x-ray ordered in triage.  Medications/Fluids: Ordered: IV Protonix , IV Zofran , will give p.o. Maalox and viscous lidocaine  for chest burning symptoms.  Most recent vital signs reviewed and are as follows: BP (!) 146/98   Pulse 98   Temp 99.5 F (37.5 C) (Oral)   Resp 19   Ht 5' 11 (1.803 m)   Wt 80.7 kg   SpO2 99%   BMI 24.83 kg/m   Initial impression: GERD/esophagitis.   8:19 PM Reassessment performed. Patient appears stable.  Not actively retching.  States that the lidocaine  helped for about 10 minutes but now continues to have burning.  Labs personally reviewed and interpreted including: CBC unremarkable; BMP glucose 117, mildly elevated anion gap at 17, bicarb slightly low at 21 otherwise unremarkable; hepatic function panel unremarkable; lipase  minimally elevated at 59; troponin was normal at 6.  Imaging personally visualized and interpreted including: Chest x-ray agree negative.  Reviewed pertinent lab work and imaging with patient at bedside. Questions answered.   Most current vital signs reviewed and are as follows: BP (!) 140/102   Pulse (!) 58   Temp 99.5 F (37.5 C) (Oral)   Resp (!) 24   Ht 5' 11 (1.803 m)   Wt 80.7 kg   SpO2 98%   BMI 24.83 kg/m   Plan: Given continued upper abdominal symptoms, worse than they have been since his fundoplication and associated retching, will proceed with CT of the abdomen pelvis to evaluate the upper abdomen.  Cardiac workup reassuring and I do not suspect ACS at this time.  9:13 PM Dilaudid  ordered for pain.      {  Click here for ABCD2, HEART and other calculators REFRESH Note before signing:1}                              Medical Decision Making Amount and/or Complexity of Data Reviewed Labs: ordered. Radiology: ordered.  Risk OTC drugs. Prescription drug management.   ***  {Document critical care time when appropriate  Document review of labs and clinical decision tools ie CHADS2VASC2, etc  Document your independent review of radiology images and any outside records  Document your discussion with family members, caretakers and with consultants  Document social determinants of health affecting pt's care  Document your decision making why or why not admission, treatments were needed:32947:::1}   Final diagnoses:  None    ED Discharge Orders     None        "

## 2024-03-24 MED ORDER — TRAMADOL HCL 50 MG PO TABS: 50.0000 mg | ORAL_TABLET | Freq: Four times a day (QID) | ORAL | 0 refills | Status: AC | PRN

## 2024-03-24 MED ORDER — FAMOTIDINE 20 MG PO TABS: 20.0000 mg | ORAL_TABLET | Freq: Two times a day (BID) | ORAL | 0 refills | Status: AC

## 2024-03-24 MED ORDER — PANTOPRAZOLE SODIUM 20 MG PO TBEC: 20.0000 mg | DELAYED_RELEASE_TABLET | Freq: Every day | ORAL | 0 refills | Status: AC

## 2024-03-24 NOTE — ED Notes (Signed)
..  The patient is A&OX4, ambulatory at d/c with independent steady gait, NAD. Pt verbalized understanding of d/c instructions, prescriptions and follow up care.

## 2024-03-24 NOTE — Discharge Instructions (Addendum)
 Please read and follow all provided instructions.  Your diagnoses today include:  1. Nausea   2. Precordial pain     Tests performed today include: An EKG of your heart: No sign of irregular heartbeat or heart problem A chest x-ray: Shows normal-appearing lungs without complication Cardiac enzymes - a blood test for heart muscle damage were both normal Complete blood cell count: Normal white blood cell count and red blood cell count Complete metabolic panel: No issues with the liver kidneys Lipase (pancreas function test): Was minimally elevated, do not suspect pancreatitis today CT scan of the abdomen and pelvis did not show any signs of problems in the upper abdomen or lower chest which would would make us  more concerned about the source of your pain today Vital signs. See below for your results today.   Medications prescribed:  Pantoprazole  - stomach acid reducer (Take this medication everyday)  Pepcid  (famotidine ) -stomach acid reducer (Take this medication twice a day for one week)  Tramadol  - narcotic-like pain medication  DO NOT drive or perform any activities that require you to be awake and alert because this medicine can make you drowsy.   Take any prescribed medications only as directed.  Follow-up instructions: Please call your gastroenterologist tomorrow to schedule an outpatient follow-up appointment.  Please continue sucralfate at home.  Return instructions:  SEEK IMMEDIATE MEDICAL ATTENTION IF: You have severe chest pain, especially if the pain is crushing or pressure-like and spreads to the arms, back, neck, or jaw, or if you have sweating, nausea or vomiting, or trouble with breathing. THIS IS AN EMERGENCY. Do not wait to see if the pain will go away. Get medical help at once. Call 911. DO NOT drive yourself to the hospital.  Your chest pain gets worse and does not go away after a few minutes of rest.  You have an attack of chest pain lasting longer than what you  usually experience.  You have significant dizziness, if you pass out, or have trouble walking.  You have chest pain not typical of your usual pain for which you originally saw your caregiver.  You have any other emergent concerns regarding your health.  Additional Information: Chest pain comes from many different causes. Your caregiver has diagnosed you as having chest pain that is not specific for one problem, but does not require admission.  You are at low risk for an acute heart condition or other serious illness.   Your vital signs today were: BP (!) 162/109   Pulse (!) 55   Temp 99.5 F (37.5 C) (Oral)   Resp 18   Ht 5' 11 (1.803 m)   Wt 80.7 kg   SpO2 99%   BMI 24.83 kg/m  If your blood pressure (BP) was elevated above 135/85 this visit, please have this repeated by your doctor within one month. --------------
